# Patient Record
Sex: Female | Born: 1945 | Race: White | Hispanic: No | Marital: Married | Smoking: Former smoker
Health system: Southern US, Community
[De-identification: ages and names within clinical notes are randomized; demographics above are authoritative.]

## PROBLEM LIST (undated history)

## (undated) DIAGNOSIS — M8580 Other specified disorders of bone density and structure, unspecified site: Secondary | ICD-10-CM

## (undated) DIAGNOSIS — E785 Hyperlipidemia, unspecified: Secondary | ICD-10-CM

## (undated) DIAGNOSIS — J449 Chronic obstructive pulmonary disease, unspecified: Secondary | ICD-10-CM

## (undated) DIAGNOSIS — I10 Essential (primary) hypertension: Secondary | ICD-10-CM

## (undated) HISTORY — DX: Other specified disorders of bone density and structure, unspecified site: M85.80

## (undated) HISTORY — DX: Essential (primary) hypertension: I10

## (undated) HISTORY — PX: TUBAL LIGATION: SHX77

## (undated) HISTORY — DX: Chronic obstructive pulmonary disease, unspecified: J44.9

## (undated) HISTORY — DX: Hyperlipidemia, unspecified: E78.5

---

## 2006-06-21 ENCOUNTER — Encounter (INDEPENDENT_AMBULATORY_CARE_PROVIDER_SITE_OTHER): Payer: Self-pay | Admitting: Interventional Radiology

## 2006-06-21 ENCOUNTER — Ambulatory Visit (HOSPITAL_COMMUNITY): Admission: RE | Admit: 2006-06-21 | Discharge: 2006-06-21 | Payer: Self-pay | Admitting: Urology

## 2007-11-11 ENCOUNTER — Encounter: Payer: Self-pay | Admitting: Internal Medicine

## 2008-01-10 ENCOUNTER — Encounter: Payer: Self-pay | Admitting: Internal Medicine

## 2008-03-02 ENCOUNTER — Telehealth: Payer: Self-pay | Admitting: Internal Medicine

## 2008-03-13 ENCOUNTER — Ambulatory Visit: Payer: Self-pay | Admitting: Internal Medicine

## 2008-03-13 DIAGNOSIS — J309 Allergic rhinitis, unspecified: Secondary | ICD-10-CM | POA: Insufficient documentation

## 2008-03-13 DIAGNOSIS — J439 Emphysema, unspecified: Secondary | ICD-10-CM

## 2008-03-13 DIAGNOSIS — J45909 Unspecified asthma, uncomplicated: Secondary | ICD-10-CM | POA: Insufficient documentation

## 2008-03-13 DIAGNOSIS — F410 Panic disorder [episodic paroxysmal anxiety] without agoraphobia: Secondary | ICD-10-CM

## 2008-03-20 ENCOUNTER — Ambulatory Visit: Payer: Self-pay | Admitting: Internal Medicine

## 2008-04-20 ENCOUNTER — Telehealth (INDEPENDENT_AMBULATORY_CARE_PROVIDER_SITE_OTHER): Payer: Self-pay | Admitting: *Deleted

## 2008-04-28 ENCOUNTER — Telehealth (INDEPENDENT_AMBULATORY_CARE_PROVIDER_SITE_OTHER): Payer: Self-pay | Admitting: *Deleted

## 2008-05-28 ENCOUNTER — Telehealth (INDEPENDENT_AMBULATORY_CARE_PROVIDER_SITE_OTHER): Payer: Self-pay | Admitting: *Deleted

## 2008-06-12 ENCOUNTER — Ambulatory Visit: Payer: Self-pay | Admitting: Internal Medicine

## 2008-06-30 ENCOUNTER — Telehealth (INDEPENDENT_AMBULATORY_CARE_PROVIDER_SITE_OTHER): Payer: Self-pay | Admitting: *Deleted

## 2009-02-05 ENCOUNTER — Telehealth (INDEPENDENT_AMBULATORY_CARE_PROVIDER_SITE_OTHER): Payer: Self-pay | Admitting: *Deleted

## 2009-03-04 ENCOUNTER — Telehealth (INDEPENDENT_AMBULATORY_CARE_PROVIDER_SITE_OTHER): Payer: Self-pay | Admitting: *Deleted

## 2010-02-13 ENCOUNTER — Encounter: Payer: Self-pay | Admitting: Urology

## 2010-02-23 NOTE — Progress Notes (Signed)
Summary: [employer] Disability  [employer] Disability form received. Request forwarded to [employer]. Gabriela Wilson  February 05, 2009 4:51 PM

## 2010-02-23 NOTE — Progress Notes (Signed)
Summary: [employer] Disability Form   [employer] Disability Form for completion. Forwarded to [employer]. Dena Chavis  March 04, 2009 3:35 PM

## 2010-10-03 ENCOUNTER — Encounter: Payer: Self-pay | Admitting: Gastroenterology

## 2010-10-14 ENCOUNTER — Ambulatory Visit (AMBULATORY_SURGERY_CENTER): Payer: Medicare Other | Admitting: *Deleted

## 2010-10-14 ENCOUNTER — Encounter: Payer: Self-pay | Admitting: Gastroenterology

## 2010-10-14 VITALS — Ht 63.0 in | Wt 186.0 lb

## 2010-10-14 DIAGNOSIS — Z1211 Encounter for screening for malignant neoplasm of colon: Secondary | ICD-10-CM

## 2010-10-14 MED ORDER — PEG-KCL-NACL-NASULF-NA ASC-C 100 G PO SOLR: ORAL | Status: DC

## 2010-10-28 ENCOUNTER — Ambulatory Visit (AMBULATORY_SURGERY_CENTER): Payer: Medicare Other | Admitting: Gastroenterology

## 2010-10-28 ENCOUNTER — Encounter: Payer: Self-pay | Admitting: Gastroenterology

## 2010-10-28 VITALS — BP 135/71 | HR 64 | Temp 97.9°F | Resp 19 | Ht 63.0 in | Wt 186.0 lb

## 2010-10-28 DIAGNOSIS — Z1211 Encounter for screening for malignant neoplasm of colon: Secondary | ICD-10-CM

## 2010-10-28 DIAGNOSIS — K5730 Diverticulosis of large intestine without perforation or abscess without bleeding: Secondary | ICD-10-CM

## 2010-10-28 MED ORDER — SODIUM CHLORIDE 0.9 % IV SOLN: 500.0000 mL | INTRAVENOUS | Status: DC

## 2010-10-28 NOTE — Patient Instructions (Signed)
Discharge instructions given with verbal understanding. Handouts on diverticulosis and a high fiber diet given. Resume previous medications. 

## 2010-10-31 ENCOUNTER — Telehealth: Payer: Self-pay | Admitting: *Deleted

## 2010-10-31 NOTE — Telephone Encounter (Signed)

## 2012-05-08 ENCOUNTER — Ambulatory Visit (INDEPENDENT_AMBULATORY_CARE_PROVIDER_SITE_OTHER): Payer: Medicare Other | Admitting: Nurse Practitioner

## 2012-05-08 ENCOUNTER — Encounter: Payer: Self-pay | Admitting: Nurse Practitioner

## 2012-05-08 VITALS — BP 112/61 | HR 62 | Temp 97.6°F | Ht 64.0 in | Wt 184.0 lb

## 2012-05-08 DIAGNOSIS — I10 Essential (primary) hypertension: Secondary | ICD-10-CM

## 2012-05-08 DIAGNOSIS — K219 Gastro-esophageal reflux disease without esophagitis: Secondary | ICD-10-CM

## 2012-05-08 DIAGNOSIS — J449 Chronic obstructive pulmonary disease, unspecified: Secondary | ICD-10-CM

## 2012-05-08 DIAGNOSIS — F411 Generalized anxiety disorder: Secondary | ICD-10-CM

## 2012-05-08 LAB — COMPLETE METABOLIC PANEL WITH GFR
Albumin: 4.4 g/dL (ref 3.5–5.2)
Alkaline Phosphatase: 48 U/L (ref 39–117)
BUN: 19 mg/dL (ref 6–23)
CO2: 28 mEq/L (ref 19–32)
GFR, Est African American: 69 mL/min
GFR, Est Non African American: 60 mL/min
Glucose, Bld: 97 mg/dL (ref 70–99)
Total Bilirubin: 0.7 mg/dL (ref 0.3–1.2)

## 2012-05-08 MED ORDER — ALPRAZOLAM 0.5 MG PO TABS: 0.5000 mg | ORAL_TABLET | Freq: Every day | ORAL | Status: DC

## 2012-05-08 MED ORDER — IPRATROPIUM-ALBUTEROL 18-103 MCG/ACT IN AERO: 2.0000 | INHALATION_SPRAY | Freq: Four times a day (QID) | RESPIRATORY_TRACT | Status: DC | PRN

## 2012-05-08 MED ORDER — LISINOPRIL-HYDROCHLOROTHIAZIDE 10-12.5 MG PO TABS: 1.0000 | ORAL_TABLET | Freq: Every day | ORAL | Status: DC

## 2012-05-08 MED ORDER — OMEPRAZOLE 20 MG PO CPDR: 20.0000 mg | DELAYED_RELEASE_CAPSULE | Freq: Every day | ORAL | Status: DC

## 2012-05-08 MED ORDER — FLUTICASONE-SALMETEROL 100-50 MCG/DOSE IN AEPB: 1.0000 | INHALATION_SPRAY | Freq: Two times a day (BID) | RESPIRATORY_TRACT | Status: DC

## 2012-05-08 MED ORDER — ALBUTEROL SULFATE HFA 108 (90 BASE) MCG/ACT IN AERS: 2.0000 | INHALATION_SPRAY | Freq: Four times a day (QID) | RESPIRATORY_TRACT | Status: DC | PRN

## 2012-05-08 NOTE — Patient Instructions (Signed)

## 2012-05-08 NOTE — Progress Notes (Signed)
  Subjective:    Patient ID: Gabriela Wilson, female    DOB: 03-21-1945, 67 y.o.   MRN: [id number]  Hypertension This is a chronic problem. The current episode started more than 1 year ago. The problem has been gradually improving since onset. The problem is controlled. Pertinent negatives include no chest pain, headaches, palpitations, peripheral edema or shortness of breath. Risk factors for coronary artery disease include post-menopausal state. Past treatments include ACE inhibitors and diuretics. The current treatment provides significant improvement. There are no compliance problems.    COPD Chronic problem. Managing well. Using Alburterol 2x/week.  GAD Chronic problem. No recent panic attacks. SOB is only trigger for anxiety. Taking Xanax once a day.   Review of Systems  Respiratory: Negative for shortness of breath.   Cardiovascular: Negative for chest pain and palpitations.  Neurological: Negative for headaches.  All other systems reviewed and are negative.       Objective:   Physical Exam  Constitutional: She is oriented to person, place, and time. She appears well-developed and well-nourished.  HENT:  Nose: Nose normal.  Mouth/Throat: Oropharynx is clear and moist.  Eyes: EOM are normal.  Neck: Trachea normal, normal range of motion and full passive range of motion without pain. Neck supple. No JVD present. Carotid bruit is not present. No thyromegaly present.  Cardiovascular: Normal rate, regular rhythm, normal heart sounds and intact distal pulses.  Exam reveals no gallop and no friction rub.   No murmur heard. Pulmonary/Chest: Effort normal and breath sounds normal.  Abdominal: Soft. Bowel sounds are normal. She exhibits no distension and no mass. There is no tenderness.  Musculoskeletal: Normal range of motion.  Lymphadenopathy:    She has no cervical adenopathy.  Neurological: She is alert and oriented to person, place, and time. She has normal reflexes.  Skin: Skin  is warm and dry.  Psychiatric: She has a normal mood and affect. Her behavior is normal. Judgment and thought content normal.   BP 112/61  Pulse 62  Temp(Src) 97.6 F (36.4 C) (Oral)  Ht 5\' 4"  (1.626 m)  Wt 184 lb (83.462 kg)  BMI 31.57 kg/m2         Assessment & Plan:  1. GAD (generalized anxiety disorder) Stress Management - ALPRAZolam (XANAX) 0.5 MG tablet; Take 1 tablet (0.5 mg total) by mouth daily.  Dispense: 30 tablet; Refill: 0  2. Essential hypertension, benign Low Na+ diet Exercise - lisinopril-hydrochlorothiazide (PRINZIDE,ZESTORETIC) 10-12.5 MG per tablet; Take 1 tablet by mouth daily.  Dispense: 90 tablet; Refill: 1 - COMPLETE METABOLIC PANEL WITH GFR - NMR Lipoprofile with Lipids  3. GERD (gastroesophageal reflux disease) Avoid irritating foods - omeprazole (PRILOSEC) 20 MG capsule; Take 1 capsule (20 mg total) by mouth daily.  Dispense: 90 capsule; Refill: 1  4. COPD (chronic obstructive pulmonary disease) Continue Advair daily - albuterol-ipratropium (COMBIVENT) 18-103 MCG/ACT inhaler; Inhale 2 puffs into the lungs every 6 (six) hours as needed.  Dispense: 1 Inhaler; Refill: 1 - Fluticasone-Salmeterol (ADVAIR DISKUS) 100-50 MCG/DOSE AEPB; Inhale 1 puff into the lungs 2 (two) times daily.  Dispense: 60 each; Refill: 5 - albuterol (PROAIR HFA) 108 (90 BASE) MCG/ACT inhaler; Inhale 2 puffs into the lungs every 6 (six) hours as needed.  Dispense: 1 Inhaler; Refill: 1  Mary-Margaret Daphine Deutscher, FNP

## 2012-05-09 ENCOUNTER — Other Ambulatory Visit: Payer: Self-pay | Admitting: Nurse Practitioner

## 2012-05-09 LAB — NMR LIPOPROFILE WITH LIPIDS
HDL Size: 9 nm — ABNORMAL LOW (ref >=9.2)
HDL-C: 43 mg/dL (ref >=40)
LDL Particle Number: 1400 nmol/L — ABNORMAL HIGH (ref <1000)
LDL Size: 20.5 nm — ABNORMAL LOW (ref >20.5)
Large HDL-P: 3.1 umol/L — ABNORMAL LOW (ref >=4.8)
Large VLDL-P: 0.8 nmol/L (ref <=2.7)
Triglycerides: 94 mg/dL (ref <150)
VLDL Size: 39.9 nm (ref <=46.6)

## 2012-05-09 MED ORDER — SIMVASTATIN 40 MG PO TABS: 40.0000 mg | ORAL_TABLET | Freq: Every day | ORAL | Status: DC

## 2012-08-22 ENCOUNTER — Encounter: Payer: Self-pay | Admitting: General Practice

## 2012-08-22 ENCOUNTER — Ambulatory Visit (INDEPENDENT_AMBULATORY_CARE_PROVIDER_SITE_OTHER): Payer: Medicare Other | Admitting: General Practice

## 2012-08-22 ENCOUNTER — Telehealth: Payer: Self-pay | Admitting: Nurse Practitioner

## 2012-08-22 VITALS — BP 135/67 | HR 76 | Temp 97.7°F | Ht 64.0 in | Wt 165.5 lb

## 2012-08-22 DIAGNOSIS — J322 Chronic ethmoidal sinusitis: Secondary | ICD-10-CM

## 2012-08-22 MED ORDER — CLARITHROMYCIN 500 MG PO TABS: 500.0000 mg | ORAL_TABLET | Freq: Two times a day (BID) | ORAL | Status: DC

## 2012-08-22 NOTE — Patient Instructions (Addendum)

## 2012-08-22 NOTE — Progress Notes (Signed)
  Subjective:    Patient ID: [name], female    DOB: 1946-01-19, 67 y.o.   MRN: [id number]  Sinusitis This is a new problem. The current episode started 1 to 4 weeks ago. The problem is unchanged. There has been no fever. Her pain is at a severity of 4/10. Associated symptoms include ear pain, headaches and sinus pressure. Pertinent negatives include no chills, congestion, coughing, neck pain or shortness of breath. Past treatments include spray decongestants, acetaminophen and oral decongestants.      Review of Systems  Constitutional: Negative for fever and chills.  HENT: Positive for ear pain and sinus pressure. Negative for congestion, neck pain and neck stiffness.   Respiratory: Negative for cough, chest tightness and shortness of breath.   Cardiovascular: Negative for chest pain and palpitations.  Genitourinary: Negative for difficulty urinating.  Neurological: Positive for headaches. Negative for dizziness and weakness.       Objective:   Physical Exam  Constitutional: She is oriented to person, place, and time. She appears well-developed and well-nourished.  HENT:  Head: Normocephalic and atraumatic.  Right Ear: External ear normal.  Left Ear: External ear normal.  Nose: Right sinus exhibits maxillary sinus tenderness and frontal sinus tenderness. Left sinus exhibits maxillary sinus tenderness and frontal sinus tenderness.  Mouth/Throat: Oropharynx is clear and moist.  Eyes: Conjunctivae and EOM are normal. Pupils are equal, round, and reactive to light.  Neck: Normal range of motion. Neck supple. No thyromegaly present.  Cardiovascular: Normal rate, regular rhythm and normal heart sounds.   Pulmonary/Chest: Effort normal and breath sounds normal. No respiratory distress. She exhibits no tenderness.  Lymphadenopathy:    She has no cervical adenopathy.  Neurological: She is alert and oriented to person, place, and time.  Skin: Skin is warm and dry.  Psychiatric: She  has a normal mood and affect.          Assessment & Plan:  1. Ethmoid sinusitis - clarithromycin (BIAXIN) 500 MG tablet; Take 1 tablet (500 mg total) by mouth 2 (two) times daily.  Dispense: 20 tablet; Refill: 0 -increase fluid intake -tyenol or motrin for mild discomfort as directed -RTO if symptom worsen or unresolved -new toothbrush 3 days after starting antibiotics -Patient verbalized understanding -Coralie Keens, FNP-C

## 2012-08-22 NOTE — Telephone Encounter (Signed)
Appt given for today 

## 2012-08-28 ENCOUNTER — Other Ambulatory Visit: Payer: Self-pay

## 2012-09-11 ENCOUNTER — Ambulatory Visit (INDEPENDENT_AMBULATORY_CARE_PROVIDER_SITE_OTHER): Payer: Medicare Other | Admitting: Nurse Practitioner

## 2012-09-11 ENCOUNTER — Encounter: Payer: Self-pay | Admitting: Nurse Practitioner

## 2012-09-11 VITALS — BP 127/64 | HR 70 | Temp 98.2°F | Ht 64.0 in | Wt 162.5 lb

## 2012-09-11 DIAGNOSIS — F411 Generalized anxiety disorder: Secondary | ICD-10-CM

## 2012-09-11 DIAGNOSIS — J449 Chronic obstructive pulmonary disease, unspecified: Secondary | ICD-10-CM

## 2012-09-11 DIAGNOSIS — I10 Essential (primary) hypertension: Secondary | ICD-10-CM

## 2012-09-11 DIAGNOSIS — E785 Hyperlipidemia, unspecified: Secondary | ICD-10-CM | POA: Insufficient documentation

## 2012-09-11 DIAGNOSIS — K219 Gastro-esophageal reflux disease without esophagitis: Secondary | ICD-10-CM

## 2012-09-11 MED ORDER — LISINOPRIL-HYDROCHLOROTHIAZIDE 10-12.5 MG PO TABS: 1.0000 | ORAL_TABLET | Freq: Every day | ORAL | Status: DC

## 2012-09-11 MED ORDER — FLUTICASONE-SALMETEROL 100-50 MCG/DOSE IN AEPB: 1.0000 | INHALATION_SPRAY | Freq: Two times a day (BID) | RESPIRATORY_TRACT | Status: DC

## 2012-09-11 MED ORDER — IPRATROPIUM-ALBUTEROL 18-103 MCG/ACT IN AERO: 2.0000 | INHALATION_SPRAY | Freq: Four times a day (QID) | RESPIRATORY_TRACT | Status: DC | PRN

## 2012-09-11 MED ORDER — ALPRAZOLAM 0.5 MG PO TABS: 0.5000 mg | ORAL_TABLET | Freq: Every day | ORAL | Status: DC

## 2012-09-11 MED ORDER — ALBUTEROL SULFATE HFA 108 (90 BASE) MCG/ACT IN AERS: 2.0000 | INHALATION_SPRAY | Freq: Four times a day (QID) | RESPIRATORY_TRACT | Status: DC | PRN

## 2012-09-11 MED ORDER — OMEPRAZOLE 20 MG PO CPDR: 20.0000 mg | DELAYED_RELEASE_CAPSULE | Freq: Every day | ORAL | Status: DC

## 2012-09-11 NOTE — Progress Notes (Signed)
Subjective:    Patient ID: Gabriela Wilson, female    DOB: 1945-07-28, 67 y.o.   MRN: [id number]  Hypertension This is a chronic problem. The current episode started more than 1 year ago. The problem has been gradually improving since onset. The problem is controlled. Pertinent negatives include no chest pain, headaches, palpitations, peripheral edema or shortness of breath. Risk factors for coronary artery disease include post-menopausal state. Past treatments include ACE inhibitors and diuretics. The current treatment provides significant improvement. There are no compliance problems.   Hyperlipidemia This is a chronic problem. The current episode started more than 1 year ago. The problem is uncontrolled. Recent lipid tests were reviewed and are high. She has no history of diabetes or hypothyroidism. Pertinent negatives include no chest pain or shortness of breath. Treatments tried: was started on statin but patient stopped on her own and is trying just fish oil. Compliance problems include adherence to diet and adherence to exercise.  Risk factors for coronary artery disease include hypertension, obesity and post-menopausal.   COPD Chronic problem. Managing well. Using Alburterol 2x/week.  GAD Chronic problem. No recent panic attacks. SOB is only trigger for anxiety. Taking Xanax once a day. GERD Prilosec OTC- keeps symptoms under control   Review of Systems  Respiratory: Negative for shortness of breath.   Cardiovascular: Negative for chest pain and palpitations.  Neurological: Negative for headaches.  All other systems reviewed and are negative.       Objective:   Physical Exam  Constitutional: She is oriented to person, place, and time. She appears well-developed and well-nourished.  HENT:  Nose: Nose normal.  Mouth/Throat: Oropharynx is clear and moist.  Eyes: EOM are normal.  Neck: Trachea normal, normal range of motion and full passive range of motion without pain. Neck  supple. No JVD present. Carotid bruit is not present. No thyromegaly present.  Cardiovascular: Normal rate, regular rhythm, normal heart sounds and intact distal pulses.  Exam reveals no gallop and no friction rub.   No murmur heard. Pulmonary/Chest: Effort normal and breath sounds normal.  Abdominal: Soft. Bowel sounds are normal. She exhibits no distension and no mass. There is no tenderness.  Musculoskeletal: Normal range of motion.  Lymphadenopathy:    She has no cervical adenopathy.  Neurological: She is alert and oriented to person, place, and time. She has normal reflexes.  Skin: Skin is warm and dry.  Psychiatric: She has a normal mood and affect. Her behavior is normal. Judgment and thought content normal.   BP 127/64  Pulse 70  Temp(Src) 98.2 F (36.8 C) (Oral)  Ht 5\' 4"  (1.626 m)  Wt 162 lb 8 oz (73.71 kg)  BMI 27.88 kg/m2         Assessment & Plan:  1. GERD (gastroesophageal reflux disease) Avoid spicy an dfatty foods - omeprazole (PRILOSEC) 20 MG capsule; Take 1 capsule (20 mg total) by mouth daily.  Dispense: 90 capsule; Refill: 1  2. Essential hypertension, benign Low NA+ diat - lisinopril-hydrochlorothiazide (PRINZIDE,ZESTORETIC) 10-12.5 MG per tablet; Take 1 tablet by mouth daily.  Dispense: 90 tablet; Refill: 1 - CMP14+EGFR  3. COPD (chronic obstructive pulmonary disease) Avoid allergens - Fluticasone-Salmeterol (ADVAIR DISKUS) 100-50 MCG/DOSE AEPB; Inhale 1 puff into the lungs 2 (two) times daily.  Dispense: 60 each; Refill: 5 - albuterol-ipratropium (COMBIVENT) 18-103 MCG/ACT inhaler; Inhale 2 puffs into the lungs every 6 (six) hours as needed.  Dispense: 1 Inhaler; Refill: 5 - albuterol (PROAIR HFA) 108 (90 BASE) MCG/ACT inhaler; Inhale  2 puffs into the lungs every 6 (six) hours as needed.  Dispense: 1 Inhaler; Refill: 5  4. GAD (generalized anxiety disorder) Stress amnagement - ALPRAZolam (XANAX) 0.5 MG tablet; Take 1 tablet (0.5 mg total) by mouth  daily.  Dispense: 30 tablet; Refill: 1  5. Hyperlipidemia LDL goal < 100 Low fat diet and exerxcise encouraged Will talk about statin once labs are back - NMR, lipoprofile  Health maintenance reviewed Hemocult cards given  Mary-Margaret Daphine Deutscher, FNP

## 2012-09-11 NOTE — Patient Instructions (Signed)

## 2012-09-12 LAB — NMR, LIPOPROFILE
Cholesterol: 191 mg/dL (ref <200)
LDL Particle Number: 1826 nmol/L — ABNORMAL HIGH (ref <1000)
LDL Size: 20.5 nm — ABNORMAL LOW (ref >20.5)
LDLC SERPL CALC-MCNC: 125 mg/dL — ABNORMAL HIGH (ref <100)
LP-IR Score: 43 (ref <=45)

## 2012-09-12 LAB — CMP14+EGFR
ALT: 22 IU/L (ref 0–32)
AST: 19 IU/L (ref 0–40)
Albumin: 4.5 g/dL (ref 3.6–4.8)
Alkaline Phosphatase: 54 IU/L (ref 39–117)
BUN/Creatinine Ratio: 16 (ref 11–26)
BUN: 16 mg/dL (ref 8–27)
CO2: 25 mmol/L (ref 18–29)
Chloride: 99 mmol/L (ref 97–108)
GFR calc Af Amer: 68 mL/min/{1.73_m2} (ref >59)
Potassium: 4.4 mmol/L (ref 3.5–5.2)
Sodium: 138 mmol/L (ref 134–144)
Total Bilirubin: 0.6 mg/dL (ref 0.0–1.2)

## 2012-09-13 ENCOUNTER — Other Ambulatory Visit: Payer: Self-pay | Admitting: Nurse Practitioner

## 2012-09-13 DIAGNOSIS — E785 Hyperlipidemia, unspecified: Secondary | ICD-10-CM

## 2012-09-13 MED ORDER — ATORVASTATIN CALCIUM 40 MG PO TABS: 40.0000 mg | ORAL_TABLET | Freq: Every day | ORAL | Status: DC

## 2012-11-28 ENCOUNTER — Other Ambulatory Visit: Payer: Self-pay

## 2012-12-13 ENCOUNTER — Encounter: Payer: Self-pay | Admitting: Nurse Practitioner

## 2012-12-13 ENCOUNTER — Ambulatory Visit (INDEPENDENT_AMBULATORY_CARE_PROVIDER_SITE_OTHER): Payer: Medicare Other | Admitting: Nurse Practitioner

## 2012-12-13 VITALS — BP 131/77 | HR 64 | Temp 97.7°F | Ht 64.0 in | Wt 159.0 lb

## 2012-12-13 DIAGNOSIS — E785 Hyperlipidemia, unspecified: Secondary | ICD-10-CM

## 2012-12-13 DIAGNOSIS — F411 Generalized anxiety disorder: Secondary | ICD-10-CM

## 2012-12-13 DIAGNOSIS — I10 Essential (primary) hypertension: Secondary | ICD-10-CM

## 2012-12-13 DIAGNOSIS — Z23 Encounter for immunization: Secondary | ICD-10-CM

## 2012-12-13 DIAGNOSIS — J449 Chronic obstructive pulmonary disease, unspecified: Secondary | ICD-10-CM

## 2012-12-13 DIAGNOSIS — K219 Gastro-esophageal reflux disease without esophagitis: Secondary | ICD-10-CM

## 2012-12-13 MED ORDER — LISINOPRIL-HYDROCHLOROTHIAZIDE 10-12.5 MG PO TABS: 1.0000 | ORAL_TABLET | Freq: Every day | ORAL | Status: DC

## 2012-12-13 MED ORDER — ALPRAZOLAM 0.5 MG PO TABS: 0.5000 mg | ORAL_TABLET | Freq: Every day | ORAL | Status: DC

## 2012-12-13 MED ORDER — ATORVASTATIN CALCIUM 40 MG PO TABS: 40.0000 mg | ORAL_TABLET | Freq: Every day | ORAL | Status: DC

## 2012-12-13 MED ORDER — OMEPRAZOLE 20 MG PO CPDR: 20.0000 mg | DELAYED_RELEASE_CAPSULE | Freq: Every day | ORAL | Status: DC

## 2012-12-13 NOTE — Patient Instructions (Signed)

## 2012-12-13 NOTE — Progress Notes (Signed)
Subjective:    Patient ID: Gabriela Wilson, female    DOB: Jan 19, 1946, 67 y.o.   MRN: [id number]  Hypertension This is a chronic problem. The current episode started more than 1 year ago. The problem has been gradually improving since onset. The problem is controlled. Pertinent negatives include no chest pain, headaches, palpitations, peripheral edema or shortness of breath. Risk factors for coronary artery disease include post-menopausal state. Past treatments include ACE inhibitors and diuretics. The current treatment provides significant improvement. There are no compliance problems.   Hyperlipidemia This is a chronic problem. The current episode started more than 1 year ago. The problem is uncontrolled. Recent lipid tests were reviewed and are high. She has no history of diabetes or hypothyroidism. Pertinent negatives include no chest pain or shortness of breath. Treatments tried: was started on statin but patient stopped on her own and is trying just fish oil. Compliance problems include adherence to diet and adherence to exercise.  Risk factors for coronary artery disease include hypertension, obesity and post-menopausal.   COPD Chronic problem. Managing well. Using Alburterol 2x/week.  GAD Chronic problem. No recent panic attacks. SOB is only trigger for anxiety. Taking Xanax once a day. GERD Prilosec OTC- keeps symptoms under control   Review of Systems  Respiratory: Negative for shortness of breath.   Cardiovascular: Negative for chest pain and palpitations.  Neurological: Negative for headaches.  All other systems reviewed and are negative.       Objective:   Physical Exam  Constitutional: She is oriented to person, place, and time. She appears well-developed and well-nourished.  HENT:  Nose: Nose normal.  Mouth/Throat: Oropharynx is clear and moist.  Eyes: EOM are normal.  Neck: Trachea normal, normal range of motion and full passive range of motion without pain. Neck  supple. No JVD present. Carotid bruit is not present. No thyromegaly present.  Cardiovascular: Normal rate, regular rhythm, normal heart sounds and intact distal pulses.  Exam reveals no gallop and no friction rub.   No murmur heard. Pulmonary/Chest: Effort normal and breath sounds normal.  Abdominal: Soft. Bowel sounds are normal. She exhibits no distension and no mass. There is no tenderness.  Musculoskeletal: Normal range of motion.  Lymphadenopathy:    She has no cervical adenopathy.  Neurological: She is alert and oriented to person, place, and time. She has normal reflexes.  Skin: Skin is warm and dry.  Psychiatric: She has a normal mood and affect. Her behavior is normal. Judgment and thought content normal.   BP 131/77  Pulse 64  Temp(Src) 97.7 F (36.5 C) (Oral)  Ht 5\' 4"  (1.626 m)  Wt 159 lb (72.122 kg)  BMI 27.28 kg/m2         Assessment & Plan:   1. Hyperlipidemia LDL goal < 100   2. GAD (generalized anxiety disorder)   3. Essential hypertension, benign   4. COPD   5. GERD (gastroesophageal reflux disease)    Orders Placed This Encounter  Procedures  . CMP14+EGFR  . NMR, lipoprofile   Meds ordered this encounter  Medications  . DISCONTD: atorvastatin (LIPITOR) 40 MG tablet    Sig: Take 1 tablet (40 mg total) by mouth daily.    Dispense:  30 tablet    Refill:  1    Order Specific Question:  Supervising Provider    Answer:  Ernestina Penna [[id number]]  . DISCONTD: omeprazole (PRILOSEC) 20 MG capsule    Sig: Take 1 capsule (20 mg total) by mouth daily.  Dispense:  30 capsule    Refill:  1    Order Specific Question:  Supervising Provider    Answer:  Ernestina Penna [[id number]]  . DISCONTD: lisinopril-hydrochlorothiazide (PRINZIDE,ZESTORETIC) 10-12.5 MG per tablet    Sig: Take 1 tablet by mouth daily.    Dispense:  30 tablet    Refill:  1    Order Specific Question:  Supervising Provider    Answer:  Ernestina Penna [[id number]]  . ALPRAZolam (XANAX) 0.5 MG tablet     Sig: Take 1 tablet (0.5 mg total) by mouth daily.    Dispense:  30 tablet    Refill:  1    Order Specific Question:  Supervising Provider    Answer:  Ernestina Penna [[id number]]  . atorvastatin (LIPITOR) 40 MG tablet    Sig: Take 1 tablet (40 mg total) by mouth daily.    Dispense:  90 tablet    Refill:  1    Order Specific Question:  Supervising Provider    Answer:  Ernestina Penna [[id number]]  . omeprazole (PRILOSEC) 20 MG capsule    Sig: Take 1 capsule (20 mg total) by mouth daily.    Dispense:  90 capsule    Refill:  1    Order Specific Question:  Supervising Provider    Answer:  Ernestina Penna [[id number]]  . lisinopril-hydrochlorothiazide (PRINZIDE,ZESTORETIC) 10-12.5 MG per tablet    Sig: Take 1 tablet by mouth daily.    Dispense:  90 tablet    Refill:  1    Order Specific Question:  Supervising Provider    Answer:  Deborra Medina    Continue all meds Labs pending Diet and exercise encouraged Health maintenance reviewed Follow up in 3 months Patient given hemocult cards  Mary-Margaret Daphine Deutscher, FNP

## 2012-12-14 LAB — CMP14+EGFR
ALT: 21 IU/L (ref 0–32)
AST: 16 IU/L (ref 0–40)
Albumin/Globulin Ratio: 2.3 (ref 1.1–2.5)
Alkaline Phosphatase: 53 IU/L (ref 39–117)
BUN/Creatinine Ratio: 23 (ref 11–26)
CO2: 27 mmol/L (ref 18–29)
Calcium: 9.8 mg/dL (ref 8.6–10.2)
Chloride: 99 mmol/L (ref 97–108)
GFR calc Af Amer: 88 mL/min/{1.73_m2} (ref >59)
GFR calc non Af Amer: 76 mL/min/{1.73_m2} (ref >59)
Potassium: 4.7 mmol/L (ref 3.5–5.2)
Sodium: 140 mmol/L (ref 134–144)
Total Bilirubin: 0.7 mg/dL (ref 0.0–1.2)
Total Protein: 6.3 g/dL (ref 6.0–8.5)

## 2012-12-14 LAB — NMR, LIPOPROFILE
HDL Particle Number: 33.8 umol/L (ref >=30.5)
LDL Particle Number: 1374 nmol/L — ABNORMAL HIGH (ref <1000)
LDL Size: 20.3 nm — ABNORMAL LOW (ref >20.5)
LP-IR Score: 55 — ABNORMAL HIGH (ref <=45)
Small LDL Particle Number: 859 nmol/L — ABNORMAL HIGH (ref <=527)
Triglycerides by NMR: 96 mg/dL (ref <150)

## 2013-03-26 ENCOUNTER — Ambulatory Visit (INDEPENDENT_AMBULATORY_CARE_PROVIDER_SITE_OTHER): Payer: Commercial Managed Care - HMO | Admitting: Nurse Practitioner

## 2013-03-26 ENCOUNTER — Encounter: Payer: Self-pay | Admitting: Nurse Practitioner

## 2013-03-26 VITALS — BP 124/70 | HR 69 | Temp 98.0°F | Ht 64.0 in | Wt 162.0 lb

## 2013-03-26 DIAGNOSIS — F410 Panic disorder [episodic paroxysmal anxiety] without agoraphobia: Secondary | ICD-10-CM

## 2013-03-26 DIAGNOSIS — K219 Gastro-esophageal reflux disease without esophagitis: Secondary | ICD-10-CM

## 2013-03-26 DIAGNOSIS — E785 Hyperlipidemia, unspecified: Secondary | ICD-10-CM

## 2013-03-26 DIAGNOSIS — I10 Essential (primary) hypertension: Secondary | ICD-10-CM

## 2013-03-26 DIAGNOSIS — J4489 Other specified chronic obstructive pulmonary disease: Secondary | ICD-10-CM

## 2013-03-26 DIAGNOSIS — F411 Generalized anxiety disorder: Secondary | ICD-10-CM

## 2013-03-26 DIAGNOSIS — J449 Chronic obstructive pulmonary disease, unspecified: Secondary | ICD-10-CM

## 2013-03-26 MED ORDER — FLUTICASONE-SALMETEROL 100-50 MCG/DOSE IN AEPB
1.0000 | INHALATION_SPRAY | Freq: Two times a day (BID) | RESPIRATORY_TRACT | Status: DC
Start: 2013-03-26 — End: 2013-10-08

## 2013-03-26 MED ORDER — OMEPRAZOLE 20 MG PO CPDR: 20.0000 mg | DELAYED_RELEASE_CAPSULE | Freq: Every day | ORAL | Status: DC

## 2013-03-26 MED ORDER — LISINOPRIL-HYDROCHLOROTHIAZIDE 10-12.5 MG PO TABS: 1.0000 | ORAL_TABLET | Freq: Every day | ORAL | Status: DC

## 2013-03-26 MED ORDER — ALPRAZOLAM 0.5 MG PO TABS: 0.5000 mg | ORAL_TABLET | Freq: Every day | ORAL | Status: DC

## 2013-03-26 MED ORDER — ATORVASTATIN CALCIUM 40 MG PO TABS: 40.0000 mg | ORAL_TABLET | Freq: Every day | ORAL | Status: DC

## 2013-03-26 MED ORDER — ALBUTEROL SULFATE HFA 108 (90 BASE) MCG/ACT IN AERS: 2.0000 | INHALATION_SPRAY | Freq: Four times a day (QID) | RESPIRATORY_TRACT | Status: DC | PRN

## 2013-03-26 NOTE — Progress Notes (Signed)
Subjective:    Patient ID: Gabriela Wilson, female    DOB: Aug 03, 1945, 68 y.o.   MRN: [id number]  Patien tin today for follow up of chronic medical problems- no complaints today  Hypertension This is a chronic problem. The current episode started more than 1 year ago. The problem has been gradually improving since onset. The problem is controlled. Pertinent negatives include no chest pain, headaches, palpitations, peripheral edema or shortness of breath. Risk factors for coronary artery disease include post-menopausal state. Past treatments include ACE inhibitors and diuretics. The current treatment provides significant improvement. There are no compliance problems.   Hyperlipidemia This is a chronic problem. The current episode started more than 1 year ago. The problem is uncontrolled. Recent lipid tests were reviewed and are high. She has no history of diabetes or hypothyroidism. Pertinent negatives include no chest pain or shortness of breath. Treatments tried: was started on statin but patient stopped on her own and is trying just fish oil. Compliance problems include adherence to diet and adherence to exercise.  Risk factors for coronary artery disease include hypertension, obesity and post-menopausal.  COPD Chronic problem. Managing well. Using Alburterol 2x/week. GAD Chronic problem. No recent panic attacks. SOB is only trigger for anxiety. Taking Xanax once a day. GERD Prilosec OTC- keeps symptoms under control   Review of Systems  Respiratory: Negative for shortness of breath.   Cardiovascular: Negative for chest pain and palpitations.  Neurological: Negative for headaches.  All other systems reviewed and are negative.       Objective:   Physical Exam  Constitutional: She is oriented to person, place, and time. She appears well-developed and well-nourished.  HENT:  Nose: Nose normal.  Mouth/Throat: Oropharynx is clear and moist.  Eyes: EOM are normal.  Neck: Trachea normal,  normal range of motion and full passive range of motion without pain. Neck supple. No JVD present. Carotid bruit is not present. No thyromegaly present.  Cardiovascular: Normal rate, regular rhythm, normal heart sounds and intact distal pulses.  Exam reveals no gallop and no friction rub.   No murmur heard. Pulmonary/Chest: Effort normal and breath sounds normal.  Abdominal: Soft. Bowel sounds are normal. She exhibits no distension and no mass. There is no tenderness.  Musculoskeletal: Normal range of motion.  Lymphadenopathy:    She has no cervical adenopathy.  Neurological: She is alert and oriented to person, place, and time. She has normal reflexes.  Skin: Skin is warm and dry.  Psychiatric: She has a normal mood and affect. Her behavior is normal. Judgment and thought content normal.   BP 124/70  Pulse 69  Temp(Src) 98 F (36.7 C) (Oral)  Ht _0  (1.626 m)  Wt 162 lb (73.483 kg)  BMI 27.79 kg/m2         Assessment & Plan:   1. Hyperlipidemia LDL goal < 100   2. GAD (generalized anxiety disorder)   3. Essential hypertension, benign   4. COPD   5. PANIC DISORDER   6. COPD (chronic obstructive pulmonary disease)   7. GERD (gastroesophageal reflux disease)    Orders Placed This Encounter  Procedures  . CMP14+EGFR  . NMR, lipoprofile   Meds ordered this encounter  Medications  . atorvastatin (LIPITOR) 40 MG tablet    Sig: Take 1 tablet (40 mg total) by mouth daily.    Dispense:  90 tablet    Refill:  1    Order Specific Question:  Supervising Provider    Answer:  Laurance Flatten,  DONALD W [[id number]]  . Fluticasone-Salmeterol (ADVAIR DISKUS) 100-50 MCG/DOSE AEPB    Sig: Inhale 1 puff into the lungs 2 (two) times daily.    Dispense:  180 each    Refill:  1    Order Specific Question:  Supervising Provider    Answer:  Chipper Herb [[id number]]  . lisinopril-hydrochlorothiazide (PRINZIDE,ZESTORETIC) 10-12.5 MG per tablet    Sig: Take 1 tablet by mouth daily.    Dispense:  90  tablet    Refill:  1    Order Specific Question:  Supervising Provider    Answer:  Chipper Herb [[id number]]  . omeprazole (PRILOSEC) 20 MG capsule    Sig: Take 1 capsule (20 mg total) by mouth daily.    Dispense:  90 capsule    Refill:  1    Order Specific Question:  Supervising Provider    Answer:  Chipper Herb [[id number]]  . albuterol (PROAIR HFA) 108 (90 BASE) MCG/ACT inhaler    Sig: Inhale 2 puffs into the lungs every 6 (six) hours as needed.    Dispense:  3 Inhaler    Refill:  0    Order Specific Question:  Supervising Provider    Answer:  Chipper Herb [[id number]]  . ALPRAZolam (XANAX) 0.5 MG tablet    Sig: Take 1 tablet (0.5 mg total) by mouth daily.    Dispense:  30 tablet    Refill:  1    Order Specific Question:  Supervising Provider    Answer:  Joycelyn Man   Refuses immunizations Labs pending Health maintenance reviewed Diet and exercise encouraged Continue all meds Follow up  In 3 months   [place], FNP

## 2013-03-26 NOTE — Patient Instructions (Signed)

## 2013-03-28 LAB — CMP14+EGFR
A/G RATIO: 2.1 (ref 1.1–2.5)
ALT: 13 IU/L (ref 0–32)
AST: 17 IU/L (ref 0–40)
Albumin: 4.5 g/dL (ref 3.6–4.8)
Alkaline Phosphatase: 52 IU/L (ref 39–117)
BUN/Creatinine Ratio: 22 (ref 11–26)
BUN: 20 mg/dL (ref 8–27)
CO2: 24 mmol/L (ref 18–29)
Calcium: 10.4 mg/dL — ABNORMAL HIGH (ref 8.7–10.3)
Chloride: 98 mmol/L (ref 97–108)
Creatinine, Ser: 0.89 mg/dL (ref 0.57–1.00)
GFR, EST AFRICAN AMERICAN: 78 mL/min/{1.73_m2} (ref >59)
GFR, EST NON AFRICAN AMERICAN: 68 mL/min/{1.73_m2} (ref >59)
GLOBULIN, TOTAL: 2.1 g/dL (ref 1.5–4.5)
GLUCOSE: 103 mg/dL — AB (ref 65–99)
Potassium: 4.8 mmol/L (ref 3.5–5.2)
Sodium: 143 mmol/L (ref 134–144)
TOTAL PROTEIN: 6.6 g/dL (ref 6.0–8.5)
Total Bilirubin: 0.8 mg/dL (ref 0.0–1.2)

## 2013-03-28 LAB — NMR, LIPOPROFILE
Cholesterol: 142 mg/dL (ref <200)
HDL Cholesterol by NMR: 54 mg/dL (ref >=40)
HDL PARTICLE NUMBER: 37 umol/L (ref >=30.5)
LDL Particle Number: 1086 nmol/L — ABNORMAL HIGH (ref <1000)
LDL Size: 20.4 nm — ABNORMAL LOW (ref >20.5)
LDLC SERPL CALC-MCNC: 66 mg/dL (ref <100)
LP-IR Score: 45 (ref <=45)
SMALL LDL PARTICLE NUMBER: 702 nmol/L — AB (ref <=527)
TRIGLYCERIDES BY NMR: 109 mg/dL (ref <150)

## 2013-07-07 ENCOUNTER — Encounter: Payer: Self-pay | Admitting: Nurse Practitioner

## 2013-07-07 ENCOUNTER — Ambulatory Visit (INDEPENDENT_AMBULATORY_CARE_PROVIDER_SITE_OTHER): Payer: Commercial Managed Care - HMO | Admitting: Nurse Practitioner

## 2013-07-07 VITALS — BP 133/70 | HR 64 | Temp 98.2°F | Ht 64.0 in | Wt 167.6 lb

## 2013-07-07 DIAGNOSIS — I10 Essential (primary) hypertension: Secondary | ICD-10-CM

## 2013-07-07 DIAGNOSIS — F410 Panic disorder [episodic paroxysmal anxiety] without agoraphobia: Secondary | ICD-10-CM

## 2013-07-07 DIAGNOSIS — E785 Hyperlipidemia, unspecified: Secondary | ICD-10-CM

## 2013-07-07 DIAGNOSIS — J449 Chronic obstructive pulmonary disease, unspecified: Secondary | ICD-10-CM

## 2013-07-07 DIAGNOSIS — K219 Gastro-esophageal reflux disease without esophagitis: Secondary | ICD-10-CM

## 2013-07-07 DIAGNOSIS — J4489 Other specified chronic obstructive pulmonary disease: Secondary | ICD-10-CM

## 2013-07-07 DIAGNOSIS — F411 Generalized anxiety disorder: Secondary | ICD-10-CM

## 2013-07-07 MED ORDER — OMEPRAZOLE 20 MG PO CPDR: 20.0000 mg | DELAYED_RELEASE_CAPSULE | Freq: Every day | ORAL | Status: DC

## 2013-07-07 MED ORDER — ALPRAZOLAM 0.5 MG PO TABS: 0.5000 mg | ORAL_TABLET | Freq: Every day | ORAL | Status: DC

## 2013-07-07 MED ORDER — LISINOPRIL-HYDROCHLOROTHIAZIDE 10-12.5 MG PO TABS: 1.0000 | ORAL_TABLET | Freq: Every day | ORAL | Status: DC

## 2013-07-07 NOTE — Patient Instructions (Signed)

## 2013-07-07 NOTE — Progress Notes (Signed)
Subjective:    Patient ID: Gabriela Wilson, female    DOB: January 28, 1945, 68 y.o.   MRN: [id number]  Patien tin today for follow up of chronic medical problems- no complaints today  Hyperlipidemia This is a chronic problem. The current episode started more than 1 year ago. The problem is uncontrolled. Recent lipid tests were reviewed and are high. She has no history of diabetes or hypothyroidism. Pertinent negatives include no shortness of breath. Treatments tried: was started on statin but patient stopped on her own and is trying just fish oil. Compliance problems include adherence to diet and adherence to exercise.  Risk factors for coronary artery disease include hypertension, obesity and post-menopausal.  Hypertension This is a chronic problem. The current episode started more than 1 year ago. The problem has been gradually improving since onset. The problem is controlled. Pertinent negatives include no headaches, palpitations, peripheral edema or shortness of breath. Risk factors for coronary artery disease include post-menopausal state. Past treatments include ACE inhibitors and diuretics. The current treatment provides significant improvement. There are no compliance problems.   COPD Chronic problem. Managing well. Using Alburterol 2x/week. GAD Chronic problem. No recent panic attacks. SOB is only trigger for anxiety. Taking Xanax once a day. GERD Prilosec OTC- keeps symptoms under control   Review of Systems  Respiratory: Negative for shortness of breath.   Cardiovascular: Negative for palpitations.  Neurological: Negative for headaches.  All other systems reviewed and are negative.      Objective:   Physical Exam  Constitutional: She is oriented to person, place, and time. She appears well-developed and well-nourished.  HENT:  Nose: Nose normal.  Mouth/Throat: Oropharynx is clear and moist.  Eyes: EOM are normal.  Neck: Trachea normal, normal range of motion and full passive  range of motion without pain. Neck supple. No JVD present. Carotid bruit is not present. No thyromegaly present.  Cardiovascular: Normal rate, regular rhythm, normal heart sounds and intact distal pulses.  Exam reveals no gallop and no friction rub.   No murmur heard. Pulmonary/Chest: Effort normal and breath sounds normal.  Abdominal: Soft. Bowel sounds are normal. She exhibits no distension and no mass. There is no tenderness.  Musculoskeletal: Normal range of motion.  Lymphadenopathy:    She has no cervical adenopathy.  Neurological: She is alert and oriented to person, place, and time. She has normal reflexes.  Skin: Skin is warm and dry.  Psychiatric: She has a normal mood and affect. Her behavior is normal. Judgment and thought content normal.   BP 133/70  Pulse 64  Temp(Src) 98.2 F (36.8 C) (Oral)  Ht 5' 4"  (1.626 m)  Wt 167 lb 9.6 oz (76.023 kg)  BMI 28.75 kg/m2         Assessment & Plan:   1. PANIC DISORDER   2. Hyperlipidemia LDL goal < 100   3. GAD (generalized anxiety disorder)   4. Essential hypertension, benign   5. COPD   6. GERD (gastroesophageal reflux disease)    Orders Placed This Encounter  Procedures  . CMP14+EGFR  . NMR, lipoprofile   Meds ordered this encounter  Medications  . lisinopril-hydrochlorothiazide (PRINZIDE,ZESTORETIC) 10-12.5 MG per tablet    Sig: Take 1 tablet by mouth daily.    Dispense:  90 tablet    Refill:  1    Order Specific Question:  Supervising Provider    Answer:  Chipper Herb [[id number]]  . ALPRAZolam (XANAX) 0.5 MG tablet    Sig: Take 1 tablet (  0.5 mg total) by mouth daily.    Dispense:  30 tablet    Refill:  1    Order Specific Question:  Supervising Provider    Answer:  Chipper Herb [[id number]]  . omeprazole (PRILOSEC) 20 MG capsule    Sig: Take 1 capsule (20 mg total) by mouth daily.    Dispense:  90 capsule    Refill:  1    Order Specific Question:  Supervising Provider    Answer:  Chipper Herb [place] pending Health maintenance reviewed Diet and exercise encouraged Continue all meds Follow up  In 3 months   [place], FNP

## 2013-07-08 LAB — CMP14+EGFR
ALT: 13 IU/L (ref 0–32)
AST: 15 IU/L (ref 0–40)
Albumin/Globulin Ratio: 2.3 (ref 1.1–2.5)
Albumin: 4.5 g/dL (ref 3.6–4.8)
Alkaline Phosphatase: 58 IU/L (ref 39–117)
BUN/Creatinine Ratio: 23 (ref 11–26)
BUN: 21 mg/dL (ref 8–27)
CALCIUM: 9.6 mg/dL (ref 8.7–10.3)
CO2: 26 mmol/L (ref 18–29)
Chloride: 100 mmol/L (ref 97–108)
Creatinine, Ser: 0.9 mg/dL (ref 0.57–1.00)
GFR calc Af Amer: 77 mL/min/{1.73_m2} (ref >59)
GFR calc non Af Amer: 66 mL/min/{1.73_m2} (ref >59)
Globulin, Total: 2 g/dL (ref 1.5–4.5)
Glucose: 103 mg/dL — ABNORMAL HIGH (ref 65–99)
POTASSIUM: 5.1 mmol/L (ref 3.5–5.2)
Sodium: 141 mmol/L (ref 134–144)
TOTAL PROTEIN: 6.5 g/dL (ref 6.0–8.5)
Total Bilirubin: 0.6 mg/dL (ref 0.0–1.2)

## 2013-07-08 LAB — NMR, LIPOPROFILE
Cholesterol: 165 mg/dL (ref 100–199)
HDL Cholesterol by NMR: 51 mg/dL (ref >39)
HDL Particle Number: 35.3 umol/L (ref >=30.5)
LDL PARTICLE NUMBER: 1155 nmol/L — AB (ref <1000)
LDL Size: 20.7 nm (ref >20.5)
LDLC SERPL CALC-MCNC: 94 mg/dL (ref 0–99)
LP-IR Score: 36 (ref <=45)
Small LDL Particle Number: 467 nmol/L (ref <=527)
TRIGLYCERIDES BY NMR: 102 mg/dL (ref 0–149)

## 2013-10-08 ENCOUNTER — Encounter: Payer: Self-pay | Admitting: Nurse Practitioner

## 2013-10-08 ENCOUNTER — Ambulatory Visit (INDEPENDENT_AMBULATORY_CARE_PROVIDER_SITE_OTHER): Payer: Commercial Managed Care - HMO | Admitting: Nurse Practitioner

## 2013-10-08 VITALS — BP 143/75 | HR 64 | Temp 97.2°F | Ht 64.0 in | Wt 164.2 lb

## 2013-10-08 DIAGNOSIS — F410 Panic disorder [episodic paroxysmal anxiety] without agoraphobia: Secondary | ICD-10-CM

## 2013-10-08 DIAGNOSIS — I10 Essential (primary) hypertension: Secondary | ICD-10-CM

## 2013-10-08 DIAGNOSIS — J449 Chronic obstructive pulmonary disease, unspecified: Secondary | ICD-10-CM

## 2013-10-08 DIAGNOSIS — K219 Gastro-esophageal reflux disease without esophagitis: Secondary | ICD-10-CM

## 2013-10-08 DIAGNOSIS — J4489 Other specified chronic obstructive pulmonary disease: Secondary | ICD-10-CM

## 2013-10-08 DIAGNOSIS — E785 Hyperlipidemia, unspecified: Secondary | ICD-10-CM

## 2013-10-08 DIAGNOSIS — J309 Allergic rhinitis, unspecified: Secondary | ICD-10-CM

## 2013-10-08 DIAGNOSIS — F411 Generalized anxiety disorder: Secondary | ICD-10-CM

## 2013-10-08 DIAGNOSIS — J438 Other emphysema: Secondary | ICD-10-CM

## 2013-10-08 MED ORDER — ALPRAZOLAM 0.5 MG PO TABS: 0.5000 mg | ORAL_TABLET | Freq: Every day | ORAL | Status: DC

## 2013-10-08 MED ORDER — OMEPRAZOLE 20 MG PO CPDR: 20.0000 mg | DELAYED_RELEASE_CAPSULE | Freq: Every day | ORAL | Status: DC

## 2013-10-08 MED ORDER — FLUTICASONE-SALMETEROL 100-50 MCG/DOSE IN AEPB: 1.0000 | INHALATION_SPRAY | Freq: Two times a day (BID) | RESPIRATORY_TRACT | Status: DC

## 2013-10-08 MED ORDER — LISINOPRIL-HYDROCHLOROTHIAZIDE 10-12.5 MG PO TABS: 1.0000 | ORAL_TABLET | Freq: Every day | ORAL | Status: DC

## 2013-10-08 NOTE — Patient Instructions (Signed)

## 2013-10-08 NOTE — Progress Notes (Signed)
Subjective:    Patient ID: NONNIE PICKNEY, female    DOB: 11-03-1945, 68 y.o.   MRN: [id number]  Patien tin today for follow up of chronic medical problems- no complaints today  Hyperlipidemia This is a chronic problem. The current episode started more than 1 year ago. The problem is uncontrolled. Recent lipid tests were reviewed and are high. She has no history of diabetes or hypothyroidism. Pertinent negatives include no shortness of breath. Treatments tried: was started on statin but patient stopped on her own and is trying just fish oil. Compliance problems include adherence to diet and adherence to exercise.  Risk factors for coronary artery disease include hypertension, obesity and post-menopausal.  Hypertension This is a chronic problem. The current episode started more than 1 year ago. The problem has been gradually improving since onset. The problem is controlled. Pertinent negatives include no headaches, palpitations, peripheral edema or shortness of breath. Risk factors for coronary artery disease include post-menopausal state. Past treatments include ACE inhibitors and diuretics. The current treatment provides significant improvement. There are no compliance problems.   COPD Chronic problem. Managing well. Using Alburterol 2x/week. GAD Chronic problem. No recent panic attacks. SOB is only trigger for anxiety. Taking Xanax once a day. GERD Prilosec OTC- keeps symptoms under control   Review of Systems  Respiratory: Negative for shortness of breath.   Cardiovascular: Negative for palpitations.  Neurological: Negative for headaches.  All other systems reviewed and are negative.      Objective:   Physical Exam  Constitutional: She is oriented to person, place, and time. She appears well-developed and well-nourished.  HENT:  Nose: Nose normal.  Mouth/Throat: Oropharynx is clear and moist.  Eyes: EOM are normal.  Neck: Trachea normal, normal range of motion and full passive  range of motion without pain. Neck supple. No JVD present. Carotid bruit is not present. No thyromegaly present.  Cardiovascular: Normal rate, regular rhythm, normal heart sounds and intact distal pulses.  Exam reveals no gallop and no friction rub.   No murmur heard. Pulmonary/Chest: Effort normal and breath sounds normal.  Abdominal: Soft. Bowel sounds are normal. She exhibits no distension and no mass. There is no tenderness.  Musculoskeletal: Normal range of motion.  Lymphadenopathy:    She has no cervical adenopathy.  Neurological: She is alert and oriented to person, place, and time. She has normal reflexes.  Skin: Skin is warm and dry.  Psychiatric: She has a normal mood and affect. Her behavior is normal. Judgment and thought content normal.   BP 143/75  Pulse 64  Temp(Src) 97.2 F (36.2 C) (Oral)  Ht _0  (1.626 m)  Wt 164 lb 3.2 oz (74.481 kg)  BMI 28.17 kg/m2         Assessment & Plan:   1. Hyperlipidemia with target LDL less than 100  - NMR, lipoprofile Lipitor 57m daily   2. Gastroesophageal reflux disease, esophagitis presence not specifie - omeprazole (PRILOSEC) 20 MG capsule; Take 1 capsule (20 mg total) by mouth daily.  Dispense: 90 capsule; Refill: 1  3. GAD (generalized anxiety disorder) - ALPRAZolam (XANAX) 0.5 MG tablet; Take 1 tablet (0.5 mg total) by mouth daily.  Dispense: 30 tablet; Refill: 1 - CMP14+EGFR  4. Essential hypertension, benign - lisinopril-hydrochlorothiazide (PRINZIDE,ZESTORETIC) 10-12.5 MG per tablet; Take 1 tablet by mouth daily.  Dispense: 90 tablet; Refill: 1  5. ALLERGIC RHINITIS  6.  COPD Takes advair  7. Chronic obstructive pulmonary disease, unspecified COPD, unspecified chronic bronchitis type -  Fluticasone-Salmeterol (ADVAIR DISKUS) 100-50 MCG/DOSE AEPB; Inhale 1 puff into the lungs 2 (two) times daily.  Dispense: 180 each; Refill: 1  hemoccult cards given to patient with directions  Discussed weight management for  patient with BMI> 25 Labs pending Health maintenance reviewed Diet and exercise encouraged Continue all meds Follow up  In 3 months PRN   Mary-Margaret Hassell Done, FNP

## 2013-10-09 ENCOUNTER — Telehealth: Payer: Self-pay | Admitting: Family Medicine

## 2013-10-09 LAB — NMR, LIPOPROFILE
Cholesterol: 166 mg/dL (ref 100–199)
HDL Cholesterol by NMR: 47 mg/dL (ref >39)
HDL Particle Number: 31.3 umol/L (ref >=30.5)
LDL PARTICLE NUMBER: 1292 nmol/L — AB (ref <1000)
LDL Size: 21 nm (ref >20.5)
LDLC SERPL CALC-MCNC: 104 mg/dL — ABNORMAL HIGH (ref 0–99)
LP-IR Score: 37 (ref <=45)
Small LDL Particle Number: 347 nmol/L (ref <=527)
TRIGLYCERIDES BY NMR: 77 mg/dL (ref 0–149)

## 2013-10-09 LAB — CMP14+EGFR
A/G RATIO: 2 (ref 1.1–2.5)
ALT: 13 IU/L (ref 0–32)
AST: 15 IU/L (ref 0–40)
Albumin: 4.6 g/dL (ref 3.6–4.8)
Alkaline Phosphatase: 48 IU/L (ref 39–117)
BUN/Creatinine Ratio: 21 (ref 11–26)
BUN: 20 mg/dL (ref 8–27)
CALCIUM: 10 mg/dL (ref 8.7–10.3)
CHLORIDE: 100 mmol/L (ref 97–108)
CO2: 26 mmol/L (ref 18–29)
Creatinine, Ser: 0.96 mg/dL (ref 0.57–1.00)
GFR, EST AFRICAN AMERICAN: 71 mL/min/{1.73_m2} (ref >59)
GFR, EST NON AFRICAN AMERICAN: 61 mL/min/{1.73_m2} (ref >59)
GLUCOSE: 108 mg/dL — AB (ref 65–99)
Globulin, Total: 2.3 g/dL (ref 1.5–4.5)
Potassium: 4.7 mmol/L (ref 3.5–5.2)
Sodium: 141 mmol/L (ref 134–144)
TOTAL PROTEIN: 6.9 g/dL (ref 6.0–8.5)
Total Bilirubin: 0.7 mg/dL (ref 0.0–1.2)

## 2013-10-09 NOTE — Telephone Encounter (Signed)
Message copied by Waverly Ferrari on Thu Oct 09, 2013  9:38 AM ------      Message from: Chevis Pretty      Created: Thu Oct 09, 2013  8:13 AM       Kidney and liver function stable      LDL particle numbers and LDL are elevated form last time but are still ok      Continue current meds- low fat diet and exercise and recheck in 3 months       ------

## 2013-10-13 ENCOUNTER — Encounter: Payer: Self-pay | Admitting: *Deleted

## 2013-11-11 ENCOUNTER — Ambulatory Visit: Payer: Commercial Managed Care - HMO

## 2013-11-18 ENCOUNTER — Ambulatory Visit (INDEPENDENT_AMBULATORY_CARE_PROVIDER_SITE_OTHER): Payer: Commercial Managed Care - HMO

## 2013-11-18 DIAGNOSIS — Z23 Encounter for immunization: Secondary | ICD-10-CM

## 2014-01-09 ENCOUNTER — Ambulatory Visit (INDEPENDENT_AMBULATORY_CARE_PROVIDER_SITE_OTHER): Payer: Commercial Managed Care - HMO

## 2014-01-09 ENCOUNTER — Encounter: Payer: Self-pay | Admitting: Nurse Practitioner

## 2014-01-09 ENCOUNTER — Ambulatory Visit (INDEPENDENT_AMBULATORY_CARE_PROVIDER_SITE_OTHER): Payer: Commercial Managed Care - HMO | Admitting: Nurse Practitioner

## 2014-01-09 VITALS — BP 136/82 | HR 71 | Temp 97.3°F | Ht 64.0 in | Wt 164.0 lb

## 2014-01-09 DIAGNOSIS — K219 Gastro-esophageal reflux disease without esophagitis: Secondary | ICD-10-CM

## 2014-01-09 DIAGNOSIS — J449 Chronic obstructive pulmonary disease, unspecified: Secondary | ICD-10-CM

## 2014-01-09 DIAGNOSIS — E785 Hyperlipidemia, unspecified: Secondary | ICD-10-CM

## 2014-01-09 DIAGNOSIS — F411 Generalized anxiety disorder: Secondary | ICD-10-CM

## 2014-01-09 DIAGNOSIS — IMO0001 Reserved for inherently not codable concepts without codable children: Secondary | ICD-10-CM

## 2014-01-09 DIAGNOSIS — Z13820 Encounter for screening for osteoporosis: Secondary | ICD-10-CM

## 2014-01-09 DIAGNOSIS — Z1212 Encounter for screening for malignant neoplasm of rectum: Secondary | ICD-10-CM

## 2014-01-09 DIAGNOSIS — I10 Essential (primary) hypertension: Secondary | ICD-10-CM

## 2014-01-09 DIAGNOSIS — F410 Panic disorder [episodic paroxysmal anxiety] without agoraphobia: Secondary | ICD-10-CM

## 2014-01-09 MED ORDER — ALPRAZOLAM 0.5 MG PO TABS: 0.5000 mg | ORAL_TABLET | Freq: Every day | ORAL | Status: DC

## 2014-01-09 MED ORDER — LISINOPRIL-HYDROCHLOROTHIAZIDE 10-12.5 MG PO TABS: 1.0000 | ORAL_TABLET | Freq: Every day | ORAL | Status: DC

## 2014-01-09 MED ORDER — OMEPRAZOLE 20 MG PO CPDR: 20.0000 mg | DELAYED_RELEASE_CAPSULE | Freq: Every day | ORAL | Status: DC

## 2014-01-09 NOTE — Patient Instructions (Signed)

## 2014-01-09 NOTE — Progress Notes (Signed)
Subjective:    Patient ID: Gabriela Wilson, female    DOB: 1945/09/12, 68 y.o.   MRN: [id number]  Patien tin today for follow up of chronic medical problems- no complaints today  Hyperlipidemia This is a chronic problem. The current episode started more than 1 year ago. The problem is controlled. She has no history of diabetes, hypothyroidism or obesity. Pertinent negatives include no shortness of breath. Current antihyperlipidemic treatment includes statins. The current treatment provides moderate improvement of lipids. Compliance problems include adherence to diet and adherence to exercise.  Risk factors for coronary artery disease include dyslipidemia, hypertension and post-menopausal.  Hypertension This is a chronic problem. The current episode started more than 1 year ago. The problem is controlled. Pertinent negatives include no blurred vision, headaches, palpitations, peripheral edema or shortness of breath. Risk factors for coronary artery disease include dyslipidemia, family history and post-menopausal state. Past treatments include ACE inhibitors and diuretics. The current treatment provides moderate improvement. Compliance problems include diet and exercise.   COPD Chronic problem. Managing well. Using Alburterol 2x/week. GAD Chronic problem. No recent panic attacks. SOB is only trigger for anxiety. Taking Xanax once a day. GERD Prilosec OTC- keeps symptoms under control   Review of Systems  Constitutional: Negative.   HENT: Negative.   Eyes: Negative for blurred vision.  Respiratory: Negative for shortness of breath.   Cardiovascular: Negative for palpitations.  Genitourinary: Negative.   Neurological: Negative for headaches.  Psychiatric/Behavioral: Negative.   All other systems reviewed and are negative.      Objective:   Physical Exam  Constitutional: She is oriented to person, place, and time. She appears well-developed and well-nourished.  HENT:  Nose: Nose normal.   Mouth/Throat: Oropharynx is clear and moist.  Eyes: EOM are normal.  Neck: Trachea normal, normal range of motion and full passive range of motion without pain. Neck supple. No JVD present. Carotid bruit is not present. No thyromegaly present.  Cardiovascular: Normal rate, regular rhythm, normal heart sounds and intact distal pulses.  Exam reveals no gallop and no friction rub.   No murmur heard. Pulmonary/Chest: Effort normal and breath sounds normal.  Abdominal: Soft. Bowel sounds are normal. She exhibits no distension and no mass. There is no tenderness.  Musculoskeletal: Normal range of motion.  Lymphadenopathy:    She has no cervical adenopathy.  Neurological: She is alert and oriented to person, place, and time. She has normal reflexes.  Skin: Skin is warm and dry.  Psychiatric: She has a normal mood and affect. Her behavior is normal. Judgment and thought content normal.   BP 136/82 mmHg  Pulse 71  Temp(Src) 97.3 F (36.3 C) (Oral)  Ht _0  (1.626 m)  Wt 164 lb (74.39 kg)  BMI 28.14 kg/m2   Chest x ray- chronic bronchitic changes-Preliminary reading by Ronnald Collum, FNP  [place], FNP        Assessment & Plan:  1. Screening for malignant neoplasm of the rectum - Fecal occult blood, imunochemical  2. Essential hypertension, benign Do not add salt to diet - EKG 12-Lead - CMP14+EGFR - lisinopril-hydrochlorothiazide (PRINZIDE,ZESTORETIC) 10-12.5 MG per tablet; Take 1 tablet by mouth daily.  Dispense: 90 tablet; Refill: 1  3. Gastroesophageal reflux disease, esophagitis presence not specified Avoid spicy foods - omeprazole (PRILOSEC) 20 MG capsule; Take 1 capsule (20 mg total) by mouth daily.  Dispense: 90 capsule; Refill: 1  4. GAD (generalized anxiety disorder) Stress management - ALPRAZolam (XANAX) 0.5 MG  tablet; Take 1 tablet (0.5 mg total) by mouth daily.  Dispense: 30 tablet; Refill: 1  5. COPD bronchitis - DG Chest 2 View;  Future  6. Hyperlipidemia with target LDL less than 100 Low fat diet - NMR, lipoprofile  7. PANIC DISORDER  8. Screening for osteoporosis Weight bearing exercises - DG Bone Density; Future   Labs pending Health maintenance reviewed Diet and exercise encouraged Continue all meds Follow up  In 3 months   [place], FNP

## 2014-01-10 LAB — NMR, LIPOPROFILE
Cholesterol: 183 mg/dL (ref 100–199)
HDL Cholesterol by NMR: 49 mg/dL (ref >39)
HDL Particle Number: 34.8 umol/L (ref >=30.5)
LDL PARTICLE NUMBER: 1629 nmol/L — AB (ref <1000)
LDL Size: 21 nm (ref >20.5)
LDL-C: 115 mg/dL — AB (ref 0–99)
LP-IR Score: 36 (ref <=45)
Small LDL Particle Number: 768 nmol/L — ABNORMAL HIGH (ref <=527)
Triglycerides by NMR: 97 mg/dL (ref 0–149)

## 2014-01-10 LAB — CMP14+EGFR
ALBUMIN: 4.4 g/dL (ref 3.6–4.8)
ALK PHOS: 46 IU/L (ref 39–117)
ALT: 16 IU/L (ref 0–32)
AST: 19 IU/L (ref 0–40)
Albumin/Globulin Ratio: 1.9 (ref 1.1–2.5)
BUN / CREAT RATIO: 17 (ref 11–26)
BUN: 14 mg/dL (ref 8–27)
CHLORIDE: 100 mmol/L (ref 97–108)
CO2: 26 mmol/L (ref 18–29)
Calcium: 9.7 mg/dL (ref 8.7–10.3)
Creatinine, Ser: 0.82 mg/dL (ref 0.57–1.00)
GFR calc Af Amer: 86 mL/min/{1.73_m2} (ref >59)
GFR calc non Af Amer: 74 mL/min/{1.73_m2} (ref >59)
GLUCOSE: 100 mg/dL — AB (ref 65–99)
Globulin, Total: 2.3 g/dL (ref 1.5–4.5)
Potassium: 4.6 mmol/L (ref 3.5–5.2)
Sodium: 141 mmol/L (ref 134–144)
Total Bilirubin: 0.7 mg/dL (ref 0.0–1.2)
Total Protein: 6.7 g/dL (ref 6.0–8.5)

## 2014-01-11 LAB — FECAL OCCULT BLOOD, IMMUNOCHEMICAL: Fecal Occult Bld: NEGATIVE

## 2014-02-03 ENCOUNTER — Encounter: Payer: Self-pay | Admitting: *Deleted

## 2014-03-11 ENCOUNTER — Other Ambulatory Visit: Payer: Commercial Managed Care - HMO

## 2014-03-11 ENCOUNTER — Ambulatory Visit: Payer: Commercial Managed Care - HMO

## 2014-04-23 ENCOUNTER — Ambulatory Visit (INDEPENDENT_AMBULATORY_CARE_PROVIDER_SITE_OTHER): Payer: Commercial Managed Care - HMO | Admitting: Nurse Practitioner

## 2014-04-23 ENCOUNTER — Encounter: Payer: Self-pay | Admitting: Nurse Practitioner

## 2014-04-23 VITALS — BP 126/83 | HR 60 | Temp 97.8°F | Ht 64.0 in | Wt 167.4 lb

## 2014-04-23 DIAGNOSIS — I10 Essential (primary) hypertension: Secondary | ICD-10-CM

## 2014-04-23 DIAGNOSIS — K219 Gastro-esophageal reflux disease without esophagitis: Secondary | ICD-10-CM

## 2014-04-23 DIAGNOSIS — F411 Generalized anxiety disorder: Secondary | ICD-10-CM

## 2014-04-23 DIAGNOSIS — J449 Chronic obstructive pulmonary disease, unspecified: Secondary | ICD-10-CM

## 2014-04-23 DIAGNOSIS — E785 Hyperlipidemia, unspecified: Secondary | ICD-10-CM | POA: Diagnosis not present

## 2014-04-23 DIAGNOSIS — IMO0001 Reserved for inherently not codable concepts without codable children: Secondary | ICD-10-CM

## 2014-04-23 MED ORDER — LISINOPRIL-HYDROCHLOROTHIAZIDE 10-12.5 MG PO TABS: 1.0000 | ORAL_TABLET | Freq: Every day | ORAL | Status: DC

## 2014-04-23 MED ORDER — OMEPRAZOLE 20 MG PO CPDR: 20.0000 mg | DELAYED_RELEASE_CAPSULE | Freq: Every day | ORAL | Status: DC

## 2014-04-23 MED ORDER — ALPRAZOLAM 0.5 MG PO TABS: 0.5000 mg | ORAL_TABLET | Freq: Every day | ORAL | Status: DC

## 2014-04-23 MED ORDER — ATORVASTATIN CALCIUM 40 MG PO TABS: 40.0000 mg | ORAL_TABLET | Freq: Every day | ORAL | Status: DC

## 2014-04-23 NOTE — Patient Instructions (Signed)
Exercise to Stay Healthy Exercise helps you become and stay healthy. EXERCISE IDEAS AND TIPS Choose exercises that:  You enjoy.  Fit into your day. You do not need to exercise really hard to be healthy. You can do exercises at a slow or medium level and stay healthy. You can:  Stretch before and after working out.  Try yoga, Pilates, or tai chi.  Lift weights.  Walk fast, swim, jog, run, climb stairs, bicycle, dance, or rollerskate.  Take aerobic classes. Exercises that burn about 150 calories:  Running 1  miles in 15 minutes.  Playing volleyball for 45 to 60 minutes.  Washing and waxing a car for 45 to 60 minutes.  Playing touch football for 45 minutes.  Walking 1  miles in 35 minutes.  Pushing a stroller 1  miles in 30 minutes.  Playing basketball for 30 minutes.  Raking leaves for 30 minutes.  Bicycling 5 miles in 30 minutes.  Walking 2 miles in 30 minutes.  Dancing for 30 minutes.  Shoveling snow for 15 minutes.  Swimming laps for 20 minutes.  Walking up stairs for 15 minutes.  Bicycling 4 miles in 15 minutes.  Gardening for 30 to 45 minutes.  Jumping rope for 15 minutes.  Washing windows or floors for 45 to 60 minutes. Document Released: 02/11/2010 Document Revised: 04/03/2011 Document Reviewed: 02/11/2010 [employer] Patient Information 2015 [employer]. This information is not intended to replace advice given to you by your health care provider. Make sure you discuss any questions you have with your health care provider.  

## 2014-04-23 NOTE — Progress Notes (Signed)
Subjective:    Patient ID: Gabriela Wilson, female    DOB: 10-Dec-1945, 69 y.o.   MRN: [id number]   Patient here today for follow up of chronic medical problems.   Hyperlipidemia This is a chronic problem. The current episode started more than 1 year ago. The problem is controlled. Recent lipid tests were reviewed and are normal. She has no history of diabetes, hypothyroidism or obesity. Current antihyperlipidemic treatment includes statins. The current treatment provides moderate improvement of lipids. Compliance problems include adherence to diet and adherence to exercise.  Risk factors for coronary artery disease include dyslipidemia, hypertension and post-menopausal.  Hypertension This is a chronic problem. The current episode started more than 1 year ago. The problem is unchanged. Pertinent negatives include no headaches. Risk factors for coronary artery disease include dyslipidemia and post-menopausal state. Past treatments include ACE inhibitors and diuretics. The current treatment provides moderate improvement. There is no history of CAD/MI or CVA.  GERD Omeprazole daily keeps symptoms under control. COPD Currently on advair BID- has not needed albuterol over the last several months GAD Xanax BID- keeps her calm - gets very anxious if does not take.       Review of Systems  Constitutional: Negative.   HENT: Negative.   Respiratory: Negative.   Cardiovascular: Negative.   Genitourinary: Negative.   Neurological: Negative for headaches.  Psychiatric/Behavioral: Negative.   All other systems reviewed and are negative.      Objective:   Physical Exam  Constitutional: She is oriented to person, place, and time. She appears well-developed and well-nourished.  HENT:  Nose: Nose normal.  Mouth/Throat: Oropharynx is clear and moist.  Eyes: EOM are normal.  Neck: Trachea normal, normal range of motion and full passive range of motion without pain. Neck supple. No JVD present.  Carotid bruit is not present. No thyromegaly present.  Cardiovascular: Normal rate, regular rhythm, normal heart sounds and intact distal pulses.  Exam reveals no gallop and no friction rub.   No murmur heard. Pulmonary/Chest: Effort normal and breath sounds normal.  Abdominal: Soft. Bowel sounds are normal. She exhibits no distension and no mass. There is no tenderness.  Musculoskeletal: Normal range of motion.  Lymphadenopathy:    She has no cervical adenopathy.  Neurological: She is alert and oriented to person, place, and time. She has normal reflexes.  Skin: Skin is warm and dry.  1cm  Mildly erythematous lesion left auricle  Psychiatric: She has a normal mood and affect. Her behavior is normal. Judgment and thought content normal.   BP 126/83 mmHg  Pulse 60  Temp(Src) 97.8 F (36.6 C) (Oral)  Ht _0  (1.626 m)  Wt 167 lb 6.4 oz (75.932 kg)  BMI 28.72 kg/m2         Assessment & Plan:  1. Gastroesophageal reflux disease, esophagitis presence not specified Avoid spicy foods Do not 2 hours prior to bedtime - omeprazole (PRILOSEC) 20 MG capsule; Take 1 capsule (20 mg total) by mouth daily.  Dispense: 90 capsule; Refill: 1  2. Essential hypertension, benign Do not add slat to diet - lisinopril-hydrochlorothiazide (PRINZIDE,ZESTORETIC) 10-12.5 MG per tablet; Take 1 tablet by mouth daily.  Dispense: 90 tablet; Refill: 1 - CMP14+EGFR  3. GAD (generalized anxiety disorder) Stress management - ALPRAZolam (XANAX) 0.5 MG tablet; Take 1 tablet (0.5 mg total) by mouth daily.  Dispense: 30 tablet; Refill: 1  4. Hyperlipidemia with target LDL less than 100 Low fat diet - NMR, lipoprofile - atorvastatin (LIPITOR) 40  MG tablet; Take 1 tablet (40 mg total) by mouth daily.  Dispense: 90 tablet; Refill: 1  5. COPD bronchitis Continue advair as rx.   Refuses prevnar today- will think about it for next visit Labs pending Health maintenance reviewed Diet and exercise  encouraged Continue all meds Follow up  In 3 months   [place], FNP

## 2014-04-24 LAB — CMP14+EGFR
ALBUMIN: 4.2 g/dL (ref 3.6–4.8)
ALK PHOS: 50 IU/L (ref 39–117)
ALT: 16 IU/L (ref 0–32)
AST: 18 IU/L (ref 0–40)
Albumin/Globulin Ratio: 1.8 (ref 1.1–2.5)
BUN/Creatinine Ratio: 13 (ref 11–26)
BUN: 12 mg/dL (ref 8–27)
Bilirubin Total: 0.9 mg/dL (ref 0.0–1.2)
CALCIUM: 9.9 mg/dL (ref 8.7–10.3)
CHLORIDE: 101 mmol/L (ref 97–108)
CO2: 26 mmol/L (ref 18–29)
CREATININE: 0.89 mg/dL (ref 0.57–1.00)
GFR calc Af Amer: 78 mL/min/{1.73_m2} (ref >59)
GFR calc non Af Amer: 67 mL/min/{1.73_m2} (ref >59)
GLOBULIN, TOTAL: 2.3 g/dL (ref 1.5–4.5)
GLUCOSE: 87 mg/dL (ref 65–99)
Potassium: 4.1 mmol/L (ref 3.5–5.2)
Sodium: 141 mmol/L (ref 134–144)
TOTAL PROTEIN: 6.5 g/dL (ref 6.0–8.5)

## 2014-04-24 LAB — NMR, LIPOPROFILE
Cholesterol: 175 mg/dL (ref 100–199)
HDL Cholesterol by NMR: 52 mg/dL (ref >39)
HDL PARTICLE NUMBER: 32.3 umol/L (ref >=30.5)
LDL PARTICLE NUMBER: 1260 nmol/L — AB (ref <1000)
LDL Size: 21.1 nm (ref >20.5)
LDL-C: 105 mg/dL — ABNORMAL HIGH (ref 0–99)
LP-IR Score: 31 (ref <=45)
SMALL LDL PARTICLE NUMBER: 413 nmol/L (ref <=527)
TRIGLYCERIDES BY NMR: 91 mg/dL (ref 0–149)

## 2014-06-10 ENCOUNTER — Other Ambulatory Visit: Payer: Self-pay | Admitting: Nurse Practitioner

## 2014-06-10 ENCOUNTER — Ambulatory Visit (INDEPENDENT_AMBULATORY_CARE_PROVIDER_SITE_OTHER): Payer: Commercial Managed Care - HMO | Admitting: Pharmacist

## 2014-06-10 ENCOUNTER — Ambulatory Visit (INDEPENDENT_AMBULATORY_CARE_PROVIDER_SITE_OTHER): Payer: Commercial Managed Care - HMO

## 2014-06-10 ENCOUNTER — Encounter: Payer: Self-pay | Admitting: Pharmacist

## 2014-06-10 VITALS — BP 130/80 | HR 60 | Ht 64.0 in | Wt 164.0 lb

## 2014-06-10 DIAGNOSIS — Z0000 Encounter for general adult medical examination without abnormal findings: Secondary | ICD-10-CM

## 2014-06-10 DIAGNOSIS — Z13820 Encounter for screening for osteoporosis: Secondary | ICD-10-CM

## 2014-06-10 DIAGNOSIS — E785 Hyperlipidemia, unspecified: Secondary | ICD-10-CM

## 2014-06-10 DIAGNOSIS — M8580 Other specified disorders of bone density and structure, unspecified site: Secondary | ICD-10-CM

## 2014-06-10 DIAGNOSIS — M899 Disorder of bone, unspecified: Secondary | ICD-10-CM | POA: Diagnosis not present

## 2014-06-10 MED ORDER — ATORVASTATIN CALCIUM 40 MG PO TABS: 20.0000 mg | ORAL_TABLET | Freq: Every day | ORAL | Status: DC

## 2014-06-10 NOTE — Progress Notes (Signed)
Patient ID: GENEVIE ELMAN, female   DOB: 08-22-1945, 69 y.o.   MRN: [id number]    Subjective:   ANDRENA MARGERUM is a 69 y.o. female who presents for an Initial Medicare Annual Wellness Visit and osteopenia - review DEXA results.  Current Medications (verified) Outpatient Encounter Prescriptions as of 06/10/2014  Medication Sig  . ALPRAZolam (XANAX) 0.5 MG tablet Take 1 tablet (0.5 mg total) by mouth daily.  Marland Kitchen atorvastatin (LIPITOR) 40 MG tablet Take 1 tablet (40 mg total) by mouth daily. (Patient taking differently: Take 20 mg by mouth daily. )  . Fluticasone-Salmeterol (ADVAIR DISKUS) 100-50 MCG/DOSE AEPB Inhale 1 puff into the lungs 2 (two) times daily.  Marland Kitchen lisinopril-hydrochlorothiazide (PRINZIDE,ZESTORETIC) 10-12.5 MG per tablet Take 1 tablet by mouth daily.  Marland Kitchen omeprazole (PRILOSEC) 20 MG capsule Take 1 capsule (20 mg total) by mouth daily.  Marland Kitchen albuterol (PROAIR HFA) 108 (90 BASE) MCG/ACT inhaler Inhale 2 puffs into the lungs every 6 (six) hours as needed. (Patient not taking: Reported on 06/10/2014)   No facility-administered encounter medications on file as of 06/10/2014.    Allergies (verified) Review of patient's allergies indicates no known allergies.   History: Past Medical History  Diagnosis Date  . COPD (chronic obstructive pulmonary disease)   . Hypertension   . Asthma   . Hyperlipidemia   . Osteopenia    Past Surgical History  Procedure Laterality Date  . Tubal ligation     Family History  Problem Relation Age of Onset  . Colon cancer Neg Hx   . Asthma Mother   . Hypertension Mother   . Dementia Mother   . Diabetes Father   . COPD Father   . Diabetes Sister   . Hypertension Sister   . Diabetes Brother   . Early death Brother     MVA with quadrapelgic  . Diabetes Brother   . Diabetes Brother   . Cancer Sister     vulva   Social History   Occupational History  . Not on file.   Social History Main Topics  . Smoking status: Former Smoker    Quit date:  01/24/1992  . Smokeless tobacco: Never Used  . Alcohol Use: Yes     Comment: wine about every 2 weeks  . Drug Use: No  . Sexual Activity: Yes    Do you feel safe at home?  Yes  Dietary issues and exercise activities: Current Exercise Habits:: Home exercise routine, Type of exercise: walking, Time (Minutes): 10, Frequency (Times/Week): 2, Weekly Exercise (Minutes/Week): 20, Intensity: Moderate  Current Dietary habits:  Patient is trying to lose a little weight.  She has recently increased fruits and vegetables.  More salads and lean proteins  Objective:    Today's Vitals   06/10/14 1113  BP: 130/80  Pulse: 60  Height: 5\' 4"  (1.626 m)  Weight: 164 lb (74.39 kg)   Body mass index is 28.14 kg/(m^2).  Current Height: Height: 5\' 4"  (162.6 cm)      Max Lifetime Height:  5\' 4"    DEXA Results Date of Test T-Score for AP Spine L1-L4 T-Score for Total Left Hip T-Score for Total Right Hip T-Score for Neck of left hip T-Score for neck of right hip  06/10/2014 0.5 -0.7 -0.8 -1.6 -1.5  10/26/2010 0.9 -0.3 -0.4 -1.2 -1.3                 FRAX 10 year estimate: Total FX risk:  9.7%(consider medication if >/= 20%) Hip FX  risk:  1.3%  (consider medication if >/= 3%)  Activities of Daily Living In your present state of health, do you have any difficulty performing the following activities: 06/10/2014 04/23/2014  Hearing? Tempie Donning  Vision? N Y  Difficulty concentrating or making decisions? N N  Walking or climbing stairs? N N  Dressing or bathing? N N  Doing errands, shopping? N N  Preparing Food and eating ? N -  Using the Toilet? N -  In the past six months, have you accidently leaked urine? N -  Do you have problems with loss of bowel control? N -  Managing your Medications? N -  Managing your Finances? N -  Housekeeping or managing your Housekeeping? N -    Are there smokers in your home (other than you)? No   Cardiac Risk Factors include: advanced age (>27men, >29  women);dyslipidemia;hypertension  Depression Screen PHQ 2/9 Scores 06/10/2014 04/23/2014 01/09/2014 10/08/2013  PHQ - 2 Score 0 0 0 1  Exception Documentation - Patient refusal - -    Fall Risk Fall Risk  06/10/2014 04/23/2014 01/09/2014 10/08/2013 07/07/2013  Falls in the past year? No No No No No    Cognitive Function: MMSE - Mini Mental State Exam 06/10/2014  Orientation to time 5  Orientation to Place 5  Registration 3  Attention/ Calculation 4  Recall 3  Language- name 2 objects 2  Language- repeat 1  Language- follow 3 step command 3  Language- read & follow direction 1  Write a sentence 1  Copy design 1  Total score 29    Immunizations and Health Maintenance Immunization History  Administered Date(s) Administered  . Influenza,inj,Quad PF,36+ Mos 12/13/2012, 11/18/2013   There are no preventive care reminders to display for this patient.  Patient Care Team: Chevis Pretty, FNP as PCP - General (Nurse Practitioner)  Indicate any recent Medical Services you may have received from other than [facility] providers in the past year (date may be approximate).    Assessment:    Annual Wellness Visit    Screening Tests Health Maintenance  Topic Date Due  . TETANUS/TDAP  07/08/2014 (Originally 01/23/2013)  . PNA vac Low Risk Adult (1 of 2 - PCV13) 07/23/2014 (Originally 06/01/2011)  . ZOSTAVAX  10/23/2014 (Originally 06/01/2006)  . INFLUENZA VACCINE  08/24/2014  . COLON CANCER SCREENING ANNUAL FOBT  01/10/2015  . MAMMOGRAM  04/21/2015  . DEXA SCAN  06/09/2016  . COLONOSCOPY  10/27/2020        Plan:   During the course of the visit Avi was educated and counseled about the following appropriate screening and preventive services:   Vaccines to include Pneumoccal, Influenza, Hepatitis B, Td, Zostavax - UTD on influenza.  Patient refused other vaccines  Colorectal cancer screening - FOBT and colonscopy UTD  Cardiovascular disease screening - LDL was slightly elevated at  last check.  BP at goal.  Diabetes screening - Last FBG was WNL when checked 04/23/2014  Bone Denisty / Osteoporosis Screening - discussed today.  Recommended increase calcium intake.  Will check vitamin D with next labs.   Fall prevention discussed  Mammogram - UTD  Glaucoma screening / Eye Exam - Recommended have comprehensive eye exam.  Nutrition counseling - discussed limiting caloric intake by increasing non starchy vegetables, fruits and whole grains.  Limit sweets, processed foods and red meat.  Advanced Directives -patient declined information at first and then decided to take information packet.   Weight bearing exercise - consider restarting [employer] program.  Reviewed inhaler technique and which were rescue versus maintance inhalers.    Goals    . Exercise 150 minutes per week (moderate activity)    . Reduce calorie intake to 1500 calories per day      Increase non-starchy vegetables - carrots, green bean, squash, zucchini, tomatoes, onions, peppers, spinach and other green leafy vegetables, cabbage, lettuce, cucumbers, asparagus, okra (not fried), eggplant limit sugar and processed foods (cakes, cookies, ice cream, crackers and chips) Increase fresh fruit but limit serving sizes 1/2 cup or about the size of tennis or baseball limit red meat to no more than 1-2 times per week (serving size about the size of your palm) Choose whole grains / lean proteins - whole wheat bread, quinoa, whole grain rice (1/2 cup), fish, chicken, [place]        Patient Instructions (the written plan) were given to the patient.   Cherre Robins, [place], CPP, CDE   06/10/2014

## 2014-06-10 NOTE — Patient Instructions (Addendum)
Gabriela Wilson , Thank you for taking time to come for your Medicare Wellness Visit. I appreciate your ongoing commitment to your health goals. Please review the following plan we discussed and let me know if I can assist you in the future.   These are the goals we discussed: Goals    . Exercise 150 minutes per week (moderate activity)    . Reduce calorie intake to 1500 calories per day      Increase non-starchy vegetables - carrots, green bean, squash, zucchini, tomatoes, onions, peppers, spinach and other green leafy vegetables, cabbage, lettuce, cucumbers, asparagus, okra (not fried), eggplant limit sugar and processed foods (cakes, cookies, ice cream, crackers and chips) Increase fresh fruit but limit serving sizes 1/2 cup or about the size of tennis or baseball limit red meat to no more than 1-2 times per week (serving size about the size of your palm) Choose whole grains / lean proteins - whole wheat bread, quinoa, whole grain rice (1/2 cup), fish, chicken, [place]        This is a list of the screening recommended for you and due dates:  Health Maintenance  Topic Date Due  . Tetanus Vaccine  Due now  . Pneumonia vaccines (1 of 2 - PCV13) Due now  . Shingles Vaccine  Due now  . Flu Shot  08/24/2014  . Stool Blood Test  01/10/2015  . Mammogram  04/21/2015  . DEXA scan (bone density measurement)  06/09/2016  . Colon Cancer Screening  10/27/2020  *Topic was postponed. The date shown is not the original due date.    Fall Prevention and Home Safety Falls cause injuries and can affect all age groups. It is possible to use preventive measures to significantly decrease the likelihood of falls. There are many simple measures which can make your home safer and prevent falls. OUTDOORS  Repair cracks and edges of walkways and driveways.  Remove high doorway thresholds.  Trim shrubbery on the main path into your home.  Have good outside lighting.  Clear walkways of tools, rocks,  debris, and clutter.  Check that handrails are not broken and are securely fastened. Both sides of steps should have handrails.  Have leaves, snow, and ice cleared regularly.  Use sand or salt on walkways during winter months.  In the garage, clean up grease or oil spills.  Be careful about tree with acorns and gum balls - try to park away from these  Try to avoid gravel parking lots  BATHROOM  Install night lights.  Install grab bars by the toilet and in the tub and shower.  Use non-skid mats or decals in the tub or shower.  Place a plastic non-slip stool in the shower to sit on, if needed.  Keep floors dry and clean up all water on the floor immediately.  Remove soap buildup in the tub or shower on a regular basis.  Secure bath mats with non-slip, double-sided rug tape.  Remove throw rugs and tripping hazards from the floors. BEDROOMS  Install night lights.  Make sure a bedside light is easy to reach.  Do not use oversized bedding.  Keep a telephone by your bedside.  Have a firm chair with side arms to use for getting dressed.  Remove throw rugs and tripping hazards from the floor. KITCHEN  Keep handles on pots and pans turned toward the center of the stove. Use back burners when possible.  Clean up spills quickly and allow time for drying.  Avoid walking  on wet floors.  Avoid hot utensils and knives.  Position shelves so they are not too high or low.  Place commonly used objects within easy reach.  If necessary, use a sturdy step stool with a grab bar when reaching.  Keep electrical cables out of the way.  Do not use floor polish or wax that makes floors slippery. If you must use wax, use non-skid floor wax.  Remove throw rugs and tripping hazards from the floor. STAIRWAYS  Never leave objects on stairs.  Place handrails on both sides of stairways and use them. Fix any loose handrails. Make sure handrails on both sides of the stairways are as  long as the stairs.  Check carpeting to make sure it is firmly attached along stairs. Make repairs to worn or loose carpet promptly.  Avoid placing throw rugs at the top or bottom of stairways, or properly secure the rug with carpet tape to prevent slippage. Get rid of throw rugs, if possible.  Have an electrician put in a light switch at the top and bottom of the stairs. OTHER FALL PREVENTION TIPS  Wear low-heel or rubber-soled shoes that are supportive and fit well. Wear closed toe shoes.  When using a stepladder, make sure it is fully opened and both spreaders are firmly locked. Do not climb a closed stepladder.  Add color or contrast paint or tape to grab bars and handrails in your home. Place contrasting color strips on first and last steps.  Learn and use mobility aids as needed. Install an electrical emergency response system.  Turn on lights to avoid dark areas. Replace light bulbs that burn out immediately. Get light switches that glow.  Arrange furniture to create clear pathways. Keep furniture in the same place.  Firmly attach carpet with non-skid or double-sided tape.  Eliminate uneven floor surfaces.  Select a carpet pattern that does not visually hide the edge of steps.  Be aware of all pets. OTHER HOME SAFETY TIPS  Set the water temperature for 120 F (48.8 C).  Keep emergency numbers on or near the telephone.  Keep smoke detectors on every level of the home and near sleeping areas. Document Released: 12/30/2001 Document Revised: 07/11/2011 Document Reviewed: 03/31/2011 [facility] Patient Information 2015 [place], [place]. This information is not intended to replace advice given to you by your health care provider. Make sure you discuss any questions you have with your health care provider.                 Exercise for Strong Bones  Exercise is important to build and maintain strong bones / bone density.  There are 2 types of exercises that are important to  building and maintaining strong bones:  Weight- bearing and muscle-stregthening.  [employer] is a good program for both strength and balance training  Weight-bearing Exercises  These exercises include activities that make you move against gravity while staying upright. Weight-bearing exercises can be high-impact or low-impact.  High-impact weight-bearing exercises help build bones and keep them strong. If you have broken a bone due to osteoporosis or are at risk of breaking a bone, you may need to avoid high-impact exercises. If you're not sure, you should check with your healthcare provider.  Examples of high-impact weight-bearing exercises are: Dancing  Doing high-impact aerobics  Hiking  Jogging/running  Jumping Rope  Stair climbing  Tennis  Low-impact weight-bearing exercises can also help keep bones strong and are a safe alternative if you cannot do high-impact exercises.  Examples of low-impact weight-bearing exercises are: Using elliptical training machines  Doing low-impact aerobics  Using stair-step machines  Fast walking on a treadmill or outside   Muscle-Strengthening Exercises These exercises include activities where you move your body, a weight or some other resistance against gravity. They are also known as resistance exercises and include: Lifting weights  Using elastic exercise bands  Using weight machines  Lifting your own body weight  Functional movements, such as standing and rising up on your toes  Yoga and Pilates can also improve strength, balance and flexibility. However, certain positions may not be safe for people with osteoporosis or those at increased risk of broken bones. For example, exercises that have you bend forward may increase the chance of breaking a bone in the spine.   Non-Impact Exercises There are other types of exercises that can help prevent falls.  Non-impact exercises can help you to improve balance, posture and how well you  move in everyday activities. Some of these exercises include: Balance exercises that strengthen your legs and test your balance, such as Tai Chi, can decrease your risk of falls.  Posture exercises that improve your posture and reduce rounded or "sloping" shoulders can help you decrease the chance of breaking a bone, especially in the spine.  Functional exercises that improve how well you move can help you with everyday activities and decrease your chance of falling and breaking a bone. For example, if you have trouble getting up from a chair or climbing stairs, you should do these activities as exercises.   **A physical therapist can teach you balance, posture and functional exercises. He/she can also help you learn which exercises are safe and appropriate for you.  [facility] has a physical therapy office in [place] in front of our office and referrals can be made for assessments and treatment as needed and strength and balance training.  If you would like to have an assessment with [facility] and our physical therapy team please let a nurse or provider know.

## 2014-07-31 ENCOUNTER — Encounter: Payer: Self-pay | Admitting: Nurse Practitioner

## 2014-07-31 ENCOUNTER — Ambulatory Visit (INDEPENDENT_AMBULATORY_CARE_PROVIDER_SITE_OTHER): Payer: Commercial Managed Care - HMO | Admitting: Nurse Practitioner

## 2014-07-31 VITALS — BP 139/84 | HR 60 | Temp 97.4°F | Ht 64.0 in | Wt 164.0 lb

## 2014-07-31 DIAGNOSIS — Z6828 Body mass index (BMI) 28.0-28.9, adult: Secondary | ICD-10-CM

## 2014-07-31 DIAGNOSIS — F411 Generalized anxiety disorder: Secondary | ICD-10-CM | POA: Diagnosis not present

## 2014-07-31 DIAGNOSIS — M8580 Other specified disorders of bone density and structure, unspecified site: Secondary | ICD-10-CM

## 2014-07-31 DIAGNOSIS — I10 Essential (primary) hypertension: Secondary | ICD-10-CM

## 2014-07-31 DIAGNOSIS — J449 Chronic obstructive pulmonary disease, unspecified: Secondary | ICD-10-CM | POA: Diagnosis not present

## 2014-07-31 DIAGNOSIS — F410 Panic disorder [episodic paroxysmal anxiety] without agoraphobia: Secondary | ICD-10-CM | POA: Diagnosis not present

## 2014-07-31 DIAGNOSIS — Z6829 Body mass index (BMI) 29.0-29.9, adult: Secondary | ICD-10-CM | POA: Insufficient documentation

## 2014-07-31 DIAGNOSIS — E785 Hyperlipidemia, unspecified: Secondary | ICD-10-CM

## 2014-07-31 DIAGNOSIS — K219 Gastro-esophageal reflux disease without esophagitis: Secondary | ICD-10-CM | POA: Diagnosis not present

## 2014-07-31 MED ORDER — ALPRAZOLAM 0.5 MG PO TABS
0.5000 mg | ORAL_TABLET | Freq: Every day | ORAL | Status: DC
Start: 2014-07-31 — End: 2014-11-05

## 2014-07-31 MED ORDER — LISINOPRIL-HYDROCHLOROTHIAZIDE 10-12.5 MG PO TABS: 1.0000 | ORAL_TABLET | Freq: Every day | ORAL | Status: DC

## 2014-07-31 MED ORDER — FLUTICASONE-SALMETEROL 100-50 MCG/DOSE IN AEPB: 1.0000 | INHALATION_SPRAY | Freq: Two times a day (BID) | RESPIRATORY_TRACT | Status: DC

## 2014-07-31 NOTE — Progress Notes (Signed)
Subjective:    Patient ID: Gabriela Wilson, female    DOB: [birth year], 69 y.o.   MRN: [id number]   Patient here today for follow up of chronic medical problems.   Hyperlipidemia This is a chronic problem. The current episode started more than 1 year ago. The problem is controlled. Recent lipid tests were reviewed and are normal. She has no history of diabetes, hypothyroidism or obesity. Current antihyperlipidemic treatment includes statins. The current treatment provides moderate improvement of lipids. Compliance problems include adherence to diet and adherence to exercise.  Risk factors for coronary artery disease include dyslipidemia, hypertension and post-menopausal.  Hypertension This is a chronic problem. The current episode started more than 1 year ago. The problem is unchanged. Pertinent negatives include no headaches. Risk factors for coronary artery disease include dyslipidemia and post-menopausal state. Past treatments include ACE inhibitors and diuretics. The current treatment provides moderate improvement. There is no history of CAD/MI or CVA.  GERD Omeprazole daily keeps symptoms under control. COPD Currently on advair BID- has not needed albuterol over the last several months GAD Xanax BID- keeps her calm - gets very anxious if does not take.       Review of Systems  Constitutional: Negative.   HENT: Negative.   Respiratory: Negative.   Cardiovascular: Negative.   Genitourinary: Negative.   Neurological: Negative for headaches.  Psychiatric/Behavioral: Negative.   All other systems reviewed and are negative.      Objective:   Physical Exam  Constitutional: She is oriented to person, place, and time. She appears well-developed and well-nourished.  HENT:  Nose: Nose normal.  Mouth/Throat: Oropharynx is clear and moist.  Eyes: EOM are normal.  Neck: Trachea normal, normal range of motion and full passive range of motion without pain. Neck supple. No JVD present.  Carotid bruit is not present. No thyromegaly present.  Cardiovascular: Normal rate, regular rhythm, normal heart sounds and intact distal pulses.  Exam reveals no gallop and no friction rub.   No murmur heard. Pulmonary/Chest: Effort normal and breath sounds normal.  Abdominal: Soft. Bowel sounds are normal. She exhibits no distension and no mass. There is no tenderness.  Musculoskeletal: Normal range of motion.  Lymphadenopathy:    She has no cervical adenopathy.  Neurological: She is alert and oriented to person, place, and time. She has normal reflexes.  Skin: Skin is warm and dry.  Psychiatric: She has a normal mood and affect. Her behavior is normal. Judgment and thought content normal.   BP 139/84 mmHg  Pulse 60  Temp(Src) 97.4 F (36.3 C) (Oral)  Ht _0  (1.626 m)  Wt 164 lb (74.39 kg)  BMI 28.14 kg/m2         Assessment & Plan:  1. Essential hypertension, benign Do not add salt to diet - lisinopril-hydrochlorothiazide (PRINZIDE,ZESTORETIC) 10-12.5 MG per tablet; Take 1 tablet by mouth daily.  Dispense: 90 tablet; Refill: 1 - CMP14+EGFR  2. Gastroesophageal reflux disease, esophagitis presence not specified Avoid spicy foods Do not eat 2 hours prior to bedtime   3. Osteopenia Weight bearing exercises  4. PANIC DISORDER Stress management  5. Hyperlipidemia with target LDL less than 100 Low fta diet - Lipid panel  6. GAD (generalized anxiety disorder) - ALPRAZolam (XANAX) 0.5 MG tablet; Take 1 tablet (0.5 mg total) by mouth daily.  Dispense: 30 tablet; Refill: 1  7. Chronic obstructive pulmonary disease, unspecified COPD, unspecified chronic bronchitis type - Fluticasone-Salmeterol (ADVAIR DISKUS) 100-50 MCG/DOSE AEPB; Inhale 1 puff into  the lungs 2 (two) times daily.  Dispense: 180 each; Refill: 1   Refuses adult immunizations Labs pending Health maintenance reviewed Diet and exercise encouraged Continue all meds Follow up  In 3 month   [place], FNP

## 2014-07-31 NOTE — Patient Instructions (Signed)

## 2014-08-01 LAB — CMP14+EGFR
ALT: 34 IU/L — ABNORMAL HIGH (ref 0–32)
AST: 31 IU/L (ref 0–40)
Albumin/Globulin Ratio: 2.1 (ref 1.1–2.5)
Albumin: 4.5 g/dL (ref 3.6–4.8)
Alkaline Phosphatase: 49 IU/L (ref 39–117)
BUN/Creatinine Ratio: 14 (ref 11–26)
BUN: 14 mg/dL (ref 8–27)
Bilirubin Total: 0.7 mg/dL (ref 0.0–1.2)
CO2: 24 mmol/L (ref 18–29)
Calcium: 9.7 mg/dL (ref 8.7–10.3)
Chloride: 100 mmol/L (ref 97–108)
Creatinine, Ser: 0.99 mg/dL (ref 0.57–1.00)
GFR calc Af Amer: 68 mL/min/1.73
GFR calc non Af Amer: 59 mL/min/1.73 — ABNORMAL LOW
Globulin, Total: 2.1 g/dL (ref 1.5–4.5)
Glucose: 103 mg/dL — ABNORMAL HIGH (ref 65–99)
Potassium: 4.9 mmol/L (ref 3.5–5.2)
Sodium: 139 mmol/L (ref 134–144)
Total Protein: 6.6 g/dL (ref 6.0–8.5)

## 2014-08-01 LAB — LIPID PANEL
Chol/HDL Ratio: 2.9 ratio (ref 0.0–4.4)
Cholesterol, Total: 168 mg/dL (ref 100–199)
HDL: 58 mg/dL
LDL Calculated: 89 mg/dL (ref 0–99)
Triglycerides: 105 mg/dL (ref 0–149)
VLDL Cholesterol Cal: 21 mg/dL (ref 5–40)

## 2014-08-04 ENCOUNTER — Encounter: Payer: Self-pay | Admitting: *Deleted

## 2014-11-05 ENCOUNTER — Ambulatory Visit (INDEPENDENT_AMBULATORY_CARE_PROVIDER_SITE_OTHER): Payer: Commercial Managed Care - HMO | Admitting: Nurse Practitioner

## 2014-11-05 ENCOUNTER — Encounter: Payer: Self-pay | Admitting: Nurse Practitioner

## 2014-11-05 VITALS — BP 152/67 | HR 62 | Temp 97.4°F | Ht 64.0 in | Wt 170.0 lb

## 2014-11-05 DIAGNOSIS — J069 Acute upper respiratory infection, unspecified: Secondary | ICD-10-CM

## 2014-11-05 DIAGNOSIS — H6192 Disorder of left external ear, unspecified: Secondary | ICD-10-CM | POA: Diagnosis not present

## 2014-11-05 DIAGNOSIS — I10 Essential (primary) hypertension: Secondary | ICD-10-CM | POA: Diagnosis not present

## 2014-11-05 DIAGNOSIS — F411 Generalized anxiety disorder: Secondary | ICD-10-CM | POA: Diagnosis not present

## 2014-11-05 DIAGNOSIS — Z6829 Body mass index (BMI) 29.0-29.9, adult: Secondary | ICD-10-CM

## 2014-11-05 DIAGNOSIS — E785 Hyperlipidemia, unspecified: Secondary | ICD-10-CM | POA: Diagnosis not present

## 2014-11-05 DIAGNOSIS — K219 Gastro-esophageal reflux disease without esophagitis: Secondary | ICD-10-CM

## 2014-11-05 DIAGNOSIS — F410 Panic disorder [episodic paroxysmal anxiety] without agoraphobia: Secondary | ICD-10-CM

## 2014-11-05 DIAGNOSIS — J438 Other emphysema: Secondary | ICD-10-CM

## 2014-11-05 MED ORDER — ALPRAZOLAM 0.5 MG PO TABS: 0.5000 mg | ORAL_TABLET | Freq: Every day | ORAL | Status: DC

## 2014-11-05 MED ORDER — OMEPRAZOLE 20 MG PO CPDR: 20.0000 mg | DELAYED_RELEASE_CAPSULE | Freq: Every day | ORAL | Status: DC

## 2014-11-05 NOTE — Progress Notes (Addendum)
Subjective:    Patient ID: Gabriela Wilson, female    DOB: November 17, 1945, 69 y.o.   MRN: [id number]   Patient here today for follow up of chronic medical problems. * Patient has had some sinus issue the last several days and has been taking an OTC decongestant.   Hyperlipidemia This is a chronic problem. The current episode started more than 1 year ago. The problem is controlled. Recent lipid tests were reviewed and are normal. She has no history of diabetes, hypothyroidism or obesity. Current antihyperlipidemic treatment includes statins. The current treatment provides moderate improvement of lipids. Compliance problems include adherence to diet and adherence to exercise.  Risk factors for coronary artery disease include dyslipidemia, hypertension and post-menopausal.  Hypertension This is a chronic problem. The current episode started more than 1 year ago. The problem is unchanged. Pertinent negatives include no headaches. Risk factors for coronary artery disease include dyslipidemia and post-menopausal state. Past treatments include ACE inhibitors and diuretics. The current treatment provides moderate improvement. There is no history of CAD/MI or CVA.  GERD Omeprazole daily keeps symptoms under control. 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       Review of Systems  Constitutional: Negative.   HENT: Positive for congestion, postnasal drip and rhinorrhea. Negative for ear pain, sinus pressure and sore throat.   Respiratory: Positive for cough (slight nonproductive).   Cardiovascular: Negative.   Genitourinary: Negative.   Neurological: Negative for headaches.  Psychiatric/Behavioral: Negative.   All other systems reviewed and are negative.      Objective:   Physical Exam  Constitutional: She is oriented to person, place, and time. She appears  well-developed and well-nourished.  HENT:  Nose: Nose normal.  Mouth/Throat: Oropharynx is clear and moist.  Eyes: EOM are normal.  Neck: Trachea normal, normal range of motion and full passive range of motion without pain. Neck supple. No JVD present. Carotid bruit is not present. No thyromegaly present.  Cardiovascular: Normal rate, regular rhythm, normal heart sounds and intact distal pulses.  Exam reveals no gallop and no friction rub.   No murmur heard. Pulmonary/Chest: Effort normal and breath sounds normal.  Abdominal: Soft. Bowel sounds are normal. She exhibits no distension and no mass. There is no tenderness.  Musculoskeletal: Normal range of motion.  Lymphadenopathy:    She has no cervical adenopathy.  Neurological: She is alert and oriented to person, place, and time. She has normal reflexes.  Skin: Skin is warm and dry.  Whitish scaley lesion left auricle  Psychiatric: She has a normal mood and affect. Her behavior is normal. Judgment and thought content normal.    BP 152/67 mmHg  Pulse 62  Temp(Src) 97.4 F (36.3 C) (Oral)  Ht _0  (1.626 m)  Wt 170 lb (77.111 kg)  BMI 29.17 kg/m2          Assessment & Plan:  1. BMI 29.0-29.9,adult Discussed diet and exercise for person with BMI >25 Will recheck weight in 3-6 months   2. Hyperlipidemia with target LDL less than 100 Low fat diet  3. PANIC DISORDER Stress management  4. GAD (generalized anxiety disorder) Stress management  5. Gastroesophageal reflux disease, esophagitis presence not specified Avoid spicy foods Do not eat 2 hours prior to bedtime   6. EMPHYSEMA Avoid cigarette smoke and allergens  7. Essential hypertension, benign Do not add salt  to diet  8. Lesion of external ear, left Do not pick or scratch at area - Ambulatory referral to Dermatology  9. Viral URI 1. Take meds as prescribed 2. Use a cool mist humidifier especially during the winter months and when heat has been  humid. 3. Use saline nose sprays frequently 4. Saline irrigations of the nose can be very helpful if done frequently.  * 4X daily for 1 week*  * Use of a nettie pot can be helpful with this. Follow directions with this* 5. Drink plenty of fluids 6. Keep thermostat turn down low 7.For any cough or congestion  Use plain Mucinex- regular strength or max strength is fine- no decongestants   * Children- consult with Pharmacist for dosing 8. For fever or aces or pains- take tylenol or ibuprofen appropriate for age and weight.  * for fevers greater than 101 orally you may alternate ibuprofen and tylenol every  3 hours.    Meds ordered this encounter  Medications  . omeprazole (PRILOSEC) 20 MG capsule    Sig: Take 1 capsule (20 mg total) by mouth daily.    Dispense:  90 capsule    Refill:  1    Order Specific Question:  Supervising Provider    Answer:  Chipper Herb [[id number]]  . ALPRAZolam (XANAX) 0.5 MG tablet    Sig: Take 1 tablet (0.5 mg total) by mouth daily.    Dispense:  30 tablet    Refill:  1    Order Specific Question:  Supervising Provider    Answer:  Chipper Herb [[id number]]     Orders Placed This Encounter  Procedures  . CMP14+EGFR  . Lipid panel  . Ambulatory referral to Dermatology    Referral Priority:  Routine    Referral Type:  Consultation    Referral Reason:  Specialty Services Required    Referred to Provider:  Lavonna Monarch, MD    Requested Specialty:  Dermatology    Number of Visits Requested:  1     Labs pending Health maintenance reviewed Diet and exercise encouraged Continue all meds Follow up  In 3 months   [place], FNP

## 2014-11-05 NOTE — Patient Instructions (Signed)
1. Take meds as prescribed 2. Use a cool mist humidifier especially during the winter months and when heat has been humid. 3. Use saline nose sprays frequently 4. Saline irrigations of the nose can be very helpful if done frequently.  * 4X daily for 1 week*  * Use of a nettie pot can be helpful with this. Follow directions with this* 5. Drink plenty of fluids 6. Keep thermostat turn down low 7.For any cough or congestion  Use plain Mucinex- regular strength or max strength is fine- avoid decongestanta   * Children- consult with Pharmacist for dosing 8. For fever or aces or pains- take tylenol or ibuprofen appropriate for age and weight.  * for fevers greater than 101 orally you may alternate ibuprofen and tylenol every  3 hours.

## 2014-11-05 NOTE — Addendum Note (Signed)
Addended by: Chevis Pretty on: 11/05/2014 10:19 AM   Modules accepted: Orders

## 2014-11-05 NOTE — Addendum Note (Signed)
Addended by: Chevis Pretty on: 11/05/2014 10:21 AM   Modules accepted: Orders

## 2014-11-06 LAB — CMP14+EGFR
A/G RATIO: 2.3 (ref 1.1–2.5)
ALT: 17 IU/L (ref 0–32)
AST: 23 IU/L (ref 0–40)
Albumin: 4.6 g/dL (ref 3.6–4.8)
Alkaline Phosphatase: 53 IU/L (ref 39–117)
BUN/Creatinine Ratio: 18 (ref 11–26)
BUN: 17 mg/dL (ref 8–27)
Bilirubin Total: 0.6 mg/dL (ref 0.0–1.2)
CO2: 26 mmol/L (ref 18–29)
Calcium: 10.2 mg/dL (ref 8.7–10.3)
Chloride: 102 mmol/L (ref 97–108)
Creatinine, Ser: 0.92 mg/dL (ref 0.57–1.00)
GFR calc non Af Amer: 64 mL/min/{1.73_m2} (ref >59)
GFR, EST AFRICAN AMERICAN: 74 mL/min/{1.73_m2} (ref >59)
GLOBULIN, TOTAL: 2 g/dL (ref 1.5–4.5)
Glucose: 110 mg/dL — ABNORMAL HIGH (ref 65–99)
POTASSIUM: 5.4 mmol/L — AB (ref 3.5–5.2)
SODIUM: 144 mmol/L (ref 134–144)
TOTAL PROTEIN: 6.6 g/dL (ref 6.0–8.5)

## 2014-11-06 LAB — LIPID PANEL
CHOL/HDL RATIO: 4.3 ratio (ref 0.0–4.4)
Cholesterol, Total: 204 mg/dL — ABNORMAL HIGH (ref 100–199)
HDL: 47 mg/dL (ref >39)
LDL Calculated: 127 mg/dL — ABNORMAL HIGH (ref 0–99)
TRIGLYCERIDES: 148 mg/dL (ref 0–149)
VLDL Cholesterol Cal: 30 mg/dL (ref 5–40)

## 2014-11-10 ENCOUNTER — Ambulatory Visit: Payer: Commercial Managed Care - HMO | Admitting: Nurse Practitioner

## 2014-11-24 ENCOUNTER — Ambulatory Visit (INDEPENDENT_AMBULATORY_CARE_PROVIDER_SITE_OTHER): Payer: Commercial Managed Care - HMO

## 2014-11-24 DIAGNOSIS — Z23 Encounter for immunization: Secondary | ICD-10-CM

## 2014-12-08 ENCOUNTER — Other Ambulatory Visit: Payer: Self-pay | Admitting: Dermatology

## 2014-12-09 ENCOUNTER — Ambulatory Visit (INDEPENDENT_AMBULATORY_CARE_PROVIDER_SITE_OTHER): Payer: Commercial Managed Care - HMO | Admitting: *Deleted

## 2014-12-09 VITALS — BP 111/65 | HR 76

## 2014-12-09 DIAGNOSIS — I10 Essential (primary) hypertension: Secondary | ICD-10-CM

## 2014-12-09 NOTE — Patient Instructions (Signed)
Hypertension Hypertension, commonly called high blood pressure, is when the force of blood pumping through your arteries is too strong. Your arteries are the blood vessels that carry blood from your heart throughout your body. A blood pressure reading consists of a higher number over a lower number, such as 110/72. The higher number (systolic) is the pressure inside your arteries when your heart pumps. The lower number (diastolic) is the pressure inside your arteries when your heart relaxes. Ideally you want your blood pressure below 120/80. Hypertension forces your heart to work harder to pump blood. Your arteries may become narrow or stiff. Having untreated or uncontrolled hypertension can cause heart attack, stroke, kidney disease, and other problems. RISK FACTORS Some risk factors for high blood pressure are controllable. Others are not.  Risk factors you cannot control include:   Race. You may be at higher risk if you are African American.  Age. Risk increases with age.  Gender. Men are at higher risk than women before age 45 years. After age 65, women are at higher risk than men. Risk factors you can control include:  Not getting enough exercise or physical activity.  Being overweight.  Getting too much fat, sugar, calories, or salt in your diet.  Drinking too much alcohol. SIGNS AND SYMPTOMS Hypertension does not usually cause signs or symptoms. Extremely high blood pressure (hypertensive crisis) may cause headache, anxiety, shortness of breath, and nosebleed. DIAGNOSIS To check if you have hypertension, your health care provider will measure your blood pressure while you are seated, with your arm held at the level of your heart. It should be measured at least twice using the same arm. Certain conditions can cause a difference in blood pressure between your right and left arms. A blood pressure reading that is higher than normal on one occasion does not mean that you need treatment. If  it is not clear whether you have high blood pressure, you may be asked to return on a different day to have your blood pressure checked again. Or, you may be asked to monitor your blood pressure at home for 1 or more weeks. TREATMENT Treating high blood pressure includes making lifestyle changes and possibly taking medicine. Living a healthy lifestyle can help lower high blood pressure. You may need to change some of your habits. Lifestyle changes may include:  Following the DASH diet. This diet is high in fruits, vegetables, and whole grains. It is low in salt, red meat, and added sugars.  Keep your sodium intake below 2,300 mg per day.  Getting at least 30-45 minutes of aerobic exercise at least 4 times per week.  Losing weight if necessary.  Not smoking.  Limiting alcoholic beverages.  Learning ways to reduce stress. Your health care provider may prescribe medicine if lifestyle changes are not enough to get your blood pressure under control, and if one of the following is true:  You are 18-59 years of age and your systolic blood pressure is above 140.  You are 60 years of age or older, and your systolic blood pressure is above 150.  Your diastolic blood pressure is above 90.  You have diabetes, and your systolic blood pressure is over 140 or your diastolic blood pressure is over 90.  You have kidney disease and your blood pressure is above 140/90.  You have heart disease and your blood pressure is above 140/90. Your personal target blood pressure may vary depending on your medical conditions, your age, and other factors. HOME CARE INSTRUCTIONS    Have your blood pressure rechecked as directed by your health care provider.   Take medicines only as directed by your health care provider. Follow the directions carefully. Blood pressure medicines must be taken as prescribed. The medicine does not work as well when you skip doses. Skipping doses also puts you at risk for  problems.  Do not smoke.   Monitor your blood pressure at home as directed by your health care provider. SEEK MEDICAL CARE IF:   You think you are having a reaction to medicines taken.  You have recurrent headaches or feel dizzy.  You have swelling in your ankles.  You have trouble with your vision. SEEK IMMEDIATE MEDICAL CARE IF:  You develop a severe headache or confusion.  You have unusual weakness, numbness, or feel faint.  You have severe chest or abdominal pain.  You vomit repeatedly.  You have trouble breathing. MAKE SURE YOU:   Understand these instructions.  Will watch your condition.  Will get help right away if you are not doing well or get worse.   This information is not intended to replace advice given to you by your health care provider. Make sure you discuss any questions you have with your health care provider.   Document Released: 01/09/2005 Document Revised: 05/26/2014 Document Reviewed: 11/01/2012 Elsevier Interactive Patient Education 2016 [employer]  

## 2014-12-09 NOTE — Progress Notes (Signed)
Patient is here today for a bp check. Patients BP 111/65 mmHg  Pulse 76

## 2015-02-05 ENCOUNTER — Encounter: Payer: Self-pay | Admitting: Nurse Practitioner

## 2015-02-05 ENCOUNTER — Ambulatory Visit (INDEPENDENT_AMBULATORY_CARE_PROVIDER_SITE_OTHER): Payer: PPO | Admitting: Nurse Practitioner

## 2015-02-05 VITALS — BP 144/84 | HR 65 | Temp 97.6°F | Ht 64.0 in | Wt 171.4 lb

## 2015-02-05 DIAGNOSIS — F410 Panic disorder [episodic paroxysmal anxiety] without agoraphobia: Secondary | ICD-10-CM

## 2015-02-05 DIAGNOSIS — J449 Chronic obstructive pulmonary disease, unspecified: Secondary | ICD-10-CM | POA: Diagnosis not present

## 2015-02-05 DIAGNOSIS — Z1212 Encounter for screening for malignant neoplasm of rectum: Secondary | ICD-10-CM | POA: Diagnosis not present

## 2015-02-05 DIAGNOSIS — Z1159 Encounter for screening for other viral diseases: Secondary | ICD-10-CM | POA: Diagnosis not present

## 2015-02-05 DIAGNOSIS — K219 Gastro-esophageal reflux disease without esophagitis: Secondary | ICD-10-CM | POA: Diagnosis not present

## 2015-02-05 DIAGNOSIS — F411 Generalized anxiety disorder: Secondary | ICD-10-CM | POA: Diagnosis not present

## 2015-02-05 DIAGNOSIS — Z6829 Body mass index (BMI) 29.0-29.9, adult: Secondary | ICD-10-CM

## 2015-02-05 DIAGNOSIS — E785 Hyperlipidemia, unspecified: Secondary | ICD-10-CM

## 2015-02-05 DIAGNOSIS — I10 Essential (primary) hypertension: Secondary | ICD-10-CM

## 2015-02-05 DIAGNOSIS — IMO0001 Reserved for inherently not codable concepts without codable children: Secondary | ICD-10-CM

## 2015-02-05 MED ORDER — ALPRAZOLAM 0.5 MG PO TABS: 0.5000 mg | ORAL_TABLET | Freq: Every day | ORAL | Status: DC

## 2015-02-05 MED ORDER — FLUTICASONE-SALMETEROL 100-50 MCG/DOSE IN AEPB: 1.0000 | INHALATION_SPRAY | Freq: Two times a day (BID) | RESPIRATORY_TRACT | Status: DC

## 2015-02-05 MED ORDER — OMEPRAZOLE 20 MG PO CPDR: 20.0000 mg | DELAYED_RELEASE_CAPSULE | Freq: Every day | ORAL | Status: DC

## 2015-02-05 MED ORDER — ALBUTEROL SULFATE HFA 108 (90 BASE) MCG/ACT IN AERS: 2.0000 | INHALATION_SPRAY | Freq: Four times a day (QID) | RESPIRATORY_TRACT | Status: DC | PRN

## 2015-02-05 MED ORDER — LISINOPRIL-HYDROCHLOROTHIAZIDE 10-12.5 MG PO TABS: 1.0000 | ORAL_TABLET | Freq: Every day | ORAL | Status: DC

## 2015-02-05 NOTE — Patient Instructions (Signed)
Health Maintenance, Female Adopting a healthy lifestyle and getting preventive care can go a long way to promote health and wellness. Talk with your health care provider about what schedule of regular examinations is right for you. This is a good chance for you to check in with your provider about disease prevention and staying healthy. In between checkups, there are plenty of things you can do on your own. Experts have done a lot of research about which lifestyle changes and preventive measures are most likely to keep you healthy. Ask your health care provider for more information. WEIGHT AND DIET  Eat a healthy diet  Be sure to include plenty of vegetables, fruits, low-fat dairy products, and lean protein.  Do not eat a lot of foods high in solid fats, added sugars, or salt.  Get regular exercise. This is one of the most important things you can do for your health.  Most adults should exercise for at least 150 minutes each week. The exercise should increase your heart rate and make you sweat (moderate-intensity exercise).  Most adults should also do strengthening exercises at least twice a week. This is in addition to the moderate-intensity exercise.  Maintain a healthy weight  Body mass index (BMI) is a measurement that can be used to identify possible weight problems. It estimates body fat based on height and weight. Your health care provider can help determine your BMI and help you achieve or maintain a healthy weight.  For females 20 years of age and older:   A BMI below 18.5 is considered underweight.  A BMI of 18.5 to 24.9 is normal.  A BMI of 25 to 29.9 is considered overweight.  A BMI of 30 and above is considered obese.  Watch levels of cholesterol and blood lipids  You should start having your blood tested for lipids and cholesterol at 69 years of age, then have this test every 5 years.  You may need to have your cholesterol levels checked more often if:  Your lipid  or cholesterol levels are high.  You are older than 69 years of age.  You are at high risk for heart disease.  CANCER SCREENING   Lung Cancer  Lung cancer screening is recommended for adults 55-80 years old who are at high risk for lung cancer because of a history of smoking.  A yearly low-dose CT scan of the lungs is recommended for people who:  Currently smoke.  Have quit within the past 15 years.  Have at least a 30-pack-year history of smoking. A pack year is smoking an average of one pack of cigarettes a day for 1 year.  Yearly screening should continue until it has been 15 years since you quit.  Yearly screening should stop if you develop a health problem that would prevent you from having lung cancer treatment.  Breast Cancer  Practice breast self-awareness. This means understanding how your breasts normally appear and feel.  It also means doing regular breast self-exams. Let your health care provider know about any changes, no matter how small.  If you are in your 20s or 30s, you should have a clinical breast exam (CBE) by a health care provider every 1-3 years as part of a regular health exam.  If you are 40 or older, have a CBE every year. Also consider having a breast X-ray (mammogram) every year.  If you have a family history of breast cancer, talk to your health care provider about genetic screening.  If you   are at high risk for breast cancer, talk to your health care provider about having an MRI and a mammogram every year.  Breast cancer gene (BRCA) assessment is recommended for women who have family members with BRCA-related cancers. BRCA-related cancers include:  Breast.  Ovarian.  Tubal.  Peritoneal cancers.  Results of the assessment will determine the need for genetic counseling and BRCA1 and BRCA2 testing. Cervical Cancer Your health care provider may recommend that you be screened regularly for cancer of the pelvic organs (ovaries, uterus, and  vagina). This screening involves a pelvic examination, including checking for microscopic changes to the surface of your cervix (Pap test). You may be encouraged to have this screening done every 3 years, beginning at age 21.  For women ages 30-65, health care providers may recommend pelvic exams and Pap testing every 3 years, or they may recommend the Pap and pelvic exam, combined with testing for human papilloma virus (HPV), every 5 years. Some types of HPV increase your risk of cervical cancer. Testing for HPV may also be done on women of any age with unclear Pap test results.  Other health care providers may not recommend any screening for nonpregnant women who are considered low risk for pelvic cancer and who do not have symptoms. Ask your health care provider if a screening pelvic exam is right for you.  If you have had past treatment for cervical cancer or a condition that could lead to cancer, you need Pap tests and screening for cancer for at least 20 years after your treatment. If Pap tests have been discontinued, your risk factors (such as having a new sexual partner) need to be reassessed to determine if screening should resume. Some women have medical problems that increase the chance of getting cervical cancer. In these cases, your health care provider may recommend more frequent screening and Pap tests. Colorectal Cancer  This type of cancer can be detected and often prevented.  Routine colorectal cancer screening usually begins at 69 years of age and continues through 69 years of age.  Your health care provider may recommend screening at an earlier age if you have risk factors for colon cancer.  Your health care provider may also recommend using home test kits to check for hidden blood in the stool.  A small camera at the end of a tube can be used to examine your colon directly (sigmoidoscopy or colonoscopy). This is done to check for the earliest forms of colorectal  cancer.  Routine screening usually begins at age 50.  Direct examination of the colon should be repeated every 5-10 years through 69 years of age. However, you may need to be screened more often if early forms of precancerous polyps or small growths are found. Skin Cancer  Check your skin from head to toe regularly.  Tell your health care provider about any new moles or changes in moles, especially if there is a change in a mole's shape or color.  Also tell your health care provider if you have a mole that is larger than the size of a pencil eraser.  Always use sunscreen. Apply sunscreen liberally and repeatedly throughout the day.  Protect yourself by wearing long sleeves, pants, a wide-brimmed hat, and sunglasses whenever you are outside. HEART DISEASE, DIABETES, AND HIGH BLOOD PRESSURE   High blood pressure causes heart disease and increases the risk of stroke. High blood pressure is more likely to develop in:  People who have blood pressure in the high end   of the normal range (130-139/85-89 mm Hg).  People who are overweight or obese.  People who are African American.  If you are 38-23 years of age, have your blood pressure checked every 3-5 years. If you are 61 years of age or older, have your blood pressure checked every year. You should have your blood pressure measured twice--once when you are at a hospital or clinic, and once when you are not at a hospital or clinic. Record the average of the two measurements. To check your blood pressure when you are not at a hospital or clinic, you can use:  An automated blood pressure machine at a pharmacy.  A home blood pressure monitor.  If you are between 45 years and 39 years old, ask your health care provider if you should take aspirin to prevent strokes.  Have regular diabetes screenings. This involves taking a blood sample to check your fasting blood sugar level.  If you are at a normal weight and have a low risk for diabetes,  have this test once every three years after 68 years of age.  If you are overweight and have a high risk for diabetes, consider being tested at a younger age or more often. PREVENTING INFECTION  Hepatitis B  If you have a higher risk for hepatitis B, you should be screened for this virus. You are considered at high risk for hepatitis B if:  You were born in a country where hepatitis B is common. Ask your health care provider which countries are considered high risk.  Your parents were born in a high-risk country, and you have not been immunized against hepatitis B (hepatitis B vaccine).  You have HIV or AIDS.  You use needles to inject street drugs.  You live with someone who has hepatitis B.  You have had sex with someone who has hepatitis B.  You get hemodialysis treatment.  You take certain medicines for conditions, including cancer, organ transplantation, and autoimmune conditions. Hepatitis C  Blood testing is recommended for:  Everyone born from [birth year] through 1964.  Anyone with known risk factors for hepatitis C. Sexually transmitted infections (STIs)  You should be screened for sexually transmitted infections (STIs) including gonorrhea and chlamydia if:  You are sexually active and are younger than 69 years of age.  You are older than 69 years of age and your health care provider tells you that you are at risk for this type of infection.  Your sexual activity has changed since you were last screened and you are at an increased risk for chlamydia or gonorrhea. Ask your health care provider if you are at risk.  If you do not have HIV, but are at risk, it may be recommended that you take a prescription medicine daily to prevent HIV infection. This is called pre-exposure prophylaxis (PrEP). You are considered at risk if:  You are sexually active and do not regularly use condoms or know the HIV status of your partner(s).  You take drugs by injection.  You are sexually  active with a partner who has HIV. Talk with your health care provider about whether you are at high risk of being infected with HIV. If you choose to begin PrEP, you should first be tested for HIV. You should then be tested every 3 months for as long as you are taking PrEP.  PREGNANCY   If you are premenopausal and you may become pregnant, ask your health care provider about preconception counseling.  If you may  become pregnant, take 400 to 800 micrograms (mcg) of folic acid every day.  If you want to prevent pregnancy, talk to your health care provider about birth control (contraception). OSTEOPOROSIS AND MENOPAUSE   Osteoporosis is a disease in which the bones lose minerals and strength with aging. This can result in serious bone fractures. Your risk for osteoporosis can be identified using a bone density scan.  If you are 61 years of age or older, or if you are at risk for osteoporosis and fractures, ask your health care provider if you should be screened.  Ask your health care provider whether you should take a calcium or vitamin D supplement to lower your risk for osteoporosis.  Menopause may have certain physical symptoms and risks.  Hormone replacement therapy may reduce some of these symptoms and risks. Talk to your health care provider about whether hormone replacement therapy is right for you.  HOME CARE INSTRUCTIONS   Schedule regular health, dental, and eye exams.  Stay current with your immunizations.   Do not use any tobacco products including cigarettes, chewing tobacco, or electronic cigarettes.  If you are pregnant, do not drink alcohol.  If you are breastfeeding, limit how much and how often you drink alcohol.  Limit alcohol intake to no more than 1 drink per day for nonpregnant women. One drink equals 12 ounces of beer, 5 ounces of wine, or 1 ounces of hard liquor.  Do not use street drugs.  Do not share needles.  Ask your health care provider for help if  you need support or information about quitting drugs.  Tell your health care provider if you often feel depressed.  Tell your health care provider if you have ever been abused or do not feel safe at home.   This information is not intended to replace advice given to you by your health care provider. Make sure you discuss any questions you have with your health care provider.   Document Released: 07/25/2010 Document Revised: 01/30/2014 Document Reviewed: 12/11/2012 Elsevier Interactive Patient Education [employer].

## 2015-02-05 NOTE — Progress Notes (Signed)
Subjective:    Patient ID: Gabriela Wilson, female    DOB: [birth year], 71 y.o.   MRN: [id number]   Patient here today for follow up of chronic medical problems.  Outpatient Encounter Prescriptions as of 02/05/2015  Medication Sig  . albuterol (PROAIR HFA) 108 (90 BASE) MCG/ACT inhaler Inhale 2 puffs into the lungs every 6 (six) hours as needed.  . ALPRAZolam (XANAX) 0.5 MG tablet Take 1 tablet (0.5 mg total) by mouth daily.  . Fluticasone-Salmeterol (ADVAIR DISKUS) 100-50 MCG/DOSE AEPB Inhale 1 puff into the lungs 2 (two) times daily.  Marland Kitchen lisinopril-hydrochlorothiazide (PRINZIDE,ZESTORETIC) 10-12.5 MG per tablet Take 1 tablet by mouth daily.  . Multiple Vitamin (MULTIVITAMIN WITH MINERALS) TABS tablet Take 1 tablet by mouth daily.  Marland Kitchen omeprazole (PRILOSEC) 20 MG capsule Take 1 capsule (20 mg total) by mouth daily.   No facility-administered encounter medications on file as of 02/05/2015.      Hypertension This is a chronic problem. The current episode started more than 1 year ago. The problem is unchanged. Pertinent negatives include no headaches. Risk factors for coronary artery disease include dyslipidemia and post-menopausal state. Past treatments include ACE inhibitors and diuretics. The current treatment provides moderate improvement. There is no history of CAD/MI or CVA.  Hyperlipidemia This is a chronic problem. The current episode started more than 1 year ago. The problem is controlled. Recent lipid tests were reviewed and are normal. She has no history of diabetes, hypothyroidism or obesity. Current antihyperlipidemic treatment includes statins. The current treatment provides moderate improvement of lipids. Compliance problems include adherence to diet and adherence to exercise.  Risk factors for coronary artery disease include dyslipidemia, hypertension and post-menopausal.  GERD Omeprazole daily keeps symptoms under control. COPD Currently on advair BID- has needed her albuterol  more in the last several weeks since it has gotten cold outside. GAD Xanax BID- keeps her calm - gets very anxious if does not take.       Review of Systems  Constitutional: Negative.   HENT: Negative.   Respiratory: Negative.   Cardiovascular: Negative.   Genitourinary: Negative.   Neurological: Negative for headaches.  Psychiatric/Behavioral: Negative.   All other systems reviewed and are negative.      Objective:   Physical Exam  Constitutional: She is oriented to person, place, and time. She appears well-developed and well-nourished.  HENT:  Nose: Nose normal.  Mouth/Throat: Oropharynx is clear and moist.  Eyes: EOM are normal.  Neck: Trachea normal, normal range of motion and full passive range of motion without pain. Neck supple. No JVD present. Carotid bruit is not present. No thyromegaly present.  Cardiovascular: Normal rate, regular rhythm, normal heart sounds and intact distal pulses.  Exam reveals no gallop and no friction rub.   No murmur heard. Pulmonary/Chest: Effort normal and breath sounds normal.  Abdominal: Soft. Bowel sounds are normal. She exhibits no distension and no mass. There is no tenderness.  Musculoskeletal: Normal range of motion.  Lymphadenopathy:    She has no cervical adenopathy.  Neurological: She is alert and oriented to person, place, and time. She has normal reflexes.  Skin: Skin is warm and dry.  Psychiatric: She has a normal mood and affect. Her behavior is normal. Judgment and thought content normal.    BP 144/84 mmHg  Pulse 65  Temp(Src) 97.6 F (36.4 C) (Oral)  Ht 5' 4"  (1.626 m)  Wt 171 lb 6.4 oz (77.747 kg)  BMI 29.41 kg/m2  Assessment & Plan:  1. Essential hypertension, benign Do not add salt to diet - CMP14+EGFR - lisinopril-hydrochlorothiazide (PRINZIDE,ZESTORETIC) 10-12.5 MG tablet; Take 1 tablet by mouth daily.  Dispense: 90 tablet; Refill: 1  2. COPD bronchitis continue current meds- if start needing  albuterol daily will referr to pulmonologist - Fluticasone-Salmeterol (ADVAIR DISKUS) 100-50 MCG/DOSE AEPB; Inhale 1 puff into the lungs 2 (two) times daily.  Dispense: 180 each; Refill: 1 - albuterol (PROAIR HFA) 108 (90 Base) MCG/ACT inhaler; Inhale 2 puffs into the lungs every 6 (six) hours as needed.  Dispense: 3 Inhaler; Refill: 0  3. Gastroesophageal reflux disease, esophagitis presence not specified Avoid spicy foods Do not eat 2 hours prior to bedtime - omeprazole (PRILOSEC) 20 MG capsule; Take 1 capsule (20 mg total) by mouth daily.  Dispense: 90 capsule; Refill: 1  4. GAD (generalized anxiety disorder) Stress management - ALPRAZolam (XANAX) 0.5 MG tablet; Take 1 tablet (0.5 mg total) by mouth daily.  Dispense: 30 tablet; Refill: 1  5. PANIC DISORDER  6. Hyperlipidemia with target LDL less than 100 Low fat diet - Lipid panel  7. BMI 29.0-29.9,adult Discussed diet and exercise for person with BMI >25 Will recheck weight in 3-6 months   8. Need for hepatitis C screening test - Hepatitis C antibody  9. Screening for malignant neoplasm of the rectum - Fecal occult blood, imunochemical; Future    Labs pending Health maintenance reviewed- patient refuses immunizations Will do EKG and chest x ray at next visit Diet and exercise encouraged Continue all meds Follow up  In 6 months   [place], FNP

## 2015-02-06 LAB — CMP14+EGFR
A/G RATIO: 1.7 (ref 1.1–2.5)
ALBUMIN: 4.3 g/dL (ref 3.6–4.8)
ALT: 15 IU/L (ref 0–32)
AST: 17 IU/L (ref 0–40)
Alkaline Phosphatase: 66 IU/L (ref 39–117)
BILIRUBIN TOTAL: 0.6 mg/dL (ref 0.0–1.2)
BUN / CREAT RATIO: 13 (ref 11–26)
BUN: 12 mg/dL (ref 8–27)
CHLORIDE: 100 mmol/L (ref 96–106)
CO2: 23 mmol/L (ref 18–29)
Calcium: 10 mg/dL (ref 8.7–10.3)
Creatinine, Ser: 0.89 mg/dL (ref 0.57–1.00)
GFR calc non Af Amer: 67 mL/min/{1.73_m2} (ref >59)
GFR, EST AFRICAN AMERICAN: 77 mL/min/{1.73_m2} (ref >59)
GLOBULIN, TOTAL: 2.6 g/dL (ref 1.5–4.5)
Glucose: 96 mg/dL (ref 65–99)
POTASSIUM: 4.1 mmol/L (ref 3.5–5.2)
Sodium: 143 mmol/L (ref 134–144)
TOTAL PROTEIN: 6.9 g/dL (ref 6.0–8.5)

## 2015-02-06 LAB — LIPID PANEL
Chol/HDL Ratio: 3.9 ratio units (ref 0.0–4.4)
Cholesterol, Total: 182 mg/dL (ref 100–199)
HDL: 47 mg/dL (ref >39)
LDL Calculated: 110 mg/dL — ABNORMAL HIGH (ref 0–99)
Triglycerides: 123 mg/dL (ref 0–149)
VLDL CHOLESTEROL CAL: 25 mg/dL (ref 5–40)

## 2015-02-06 LAB — HEPATITIS C ANTIBODY

## 2015-06-25 ENCOUNTER — Encounter: Payer: Self-pay | Admitting: *Deleted

## 2015-08-04 ENCOUNTER — Encounter: Payer: Self-pay | Admitting: Nurse Practitioner

## 2015-08-04 ENCOUNTER — Ambulatory Visit (INDEPENDENT_AMBULATORY_CARE_PROVIDER_SITE_OTHER): Payer: PPO | Admitting: Nurse Practitioner

## 2015-08-04 VITALS — BP 161/75 | HR 57 | Temp 96.7°F | Ht 64.0 in | Wt 170.0 lb

## 2015-08-04 DIAGNOSIS — K219 Gastro-esophageal reflux disease without esophagitis: Secondary | ICD-10-CM | POA: Diagnosis not present

## 2015-08-04 DIAGNOSIS — J449 Chronic obstructive pulmonary disease, unspecified: Secondary | ICD-10-CM | POA: Diagnosis not present

## 2015-08-04 DIAGNOSIS — Z1212 Encounter for screening for malignant neoplasm of rectum: Secondary | ICD-10-CM | POA: Diagnosis not present

## 2015-08-04 DIAGNOSIS — Z6829 Body mass index (BMI) 29.0-29.9, adult: Secondary | ICD-10-CM

## 2015-08-04 DIAGNOSIS — IMO0001 Reserved for inherently not codable concepts without codable children: Secondary | ICD-10-CM

## 2015-08-04 DIAGNOSIS — E785 Hyperlipidemia, unspecified: Secondary | ICD-10-CM | POA: Diagnosis not present

## 2015-08-04 DIAGNOSIS — F411 Generalized anxiety disorder: Secondary | ICD-10-CM | POA: Diagnosis not present

## 2015-08-04 DIAGNOSIS — I10 Essential (primary) hypertension: Secondary | ICD-10-CM

## 2015-08-04 DIAGNOSIS — F410 Panic disorder [episodic paroxysmal anxiety] without agoraphobia: Secondary | ICD-10-CM

## 2015-08-04 LAB — CMP14+EGFR
ALBUMIN: 4.8 g/dL (ref 3.6–4.8)
ALK PHOS: 55 IU/L (ref 39–117)
ALT: 16 IU/L (ref 0–32)
AST: 19 IU/L (ref 0–40)
Albumin/Globulin Ratio: 1.9 (ref 1.2–2.2)
BILIRUBIN TOTAL: 0.9 mg/dL (ref 0.0–1.2)
BUN/Creatinine Ratio: 18 (ref 12–28)
BUN: 16 mg/dL (ref 8–27)
CALCIUM: 9.8 mg/dL (ref 8.7–10.3)
CHLORIDE: 98 mmol/L (ref 96–106)
CO2: 25 mmol/L (ref 18–29)
CREATININE: 0.89 mg/dL (ref 0.57–1.00)
GFR calc Af Amer: 76 mL/min/{1.73_m2} (ref >59)
GFR calc non Af Amer: 66 mL/min/{1.73_m2} (ref >59)
GLOBULIN, TOTAL: 2.5 g/dL (ref 1.5–4.5)
Glucose: 97 mg/dL (ref 65–99)
Potassium: 4.1 mmol/L (ref 3.5–5.2)
SODIUM: 139 mmol/L (ref 134–144)
TOTAL PROTEIN: 7.3 g/dL (ref 6.0–8.5)

## 2015-08-04 LAB — LIPID PANEL
CHOLESTEROL TOTAL: 194 mg/dL (ref 100–199)
Chol/HDL Ratio: 4 ratio units (ref 0.0–4.4)
HDL: 48 mg/dL (ref >39)
LDL CALC: 111 mg/dL — AB (ref 0–99)
TRIGLYCERIDES: 177 mg/dL — AB (ref 0–149)
VLDL CHOLESTEROL CAL: 35 mg/dL (ref 5–40)

## 2015-08-04 MED ORDER — FLUTICASONE-SALMETEROL 100-50 MCG/DOSE IN AEPB: 1.0000 | INHALATION_SPRAY | Freq: Two times a day (BID) | RESPIRATORY_TRACT | Status: DC

## 2015-08-04 MED ORDER — LISINOPRIL-HYDROCHLOROTHIAZIDE 10-12.5 MG PO TABS: 1.0000 | ORAL_TABLET | Freq: Every day | ORAL | Status: DC

## 2015-08-04 MED ORDER — ALPRAZOLAM 0.5 MG PO TABS: 0.5000 mg | ORAL_TABLET | Freq: Every day | ORAL | Status: DC

## 2015-08-04 MED ORDER — OMEPRAZOLE 20 MG PO CPDR: 20.0000 mg | DELAYED_RELEASE_CAPSULE | Freq: Every day | ORAL | Status: DC

## 2015-08-04 NOTE — Progress Notes (Signed)
Subjective:    Patient ID: Gabriela Wilson, female    DOB: [birth year], 70 y.o.   MRN: [id number]   Patient here today for follow up of chronic medical problems.  Outpatient Encounter Prescriptions as of 08/04/2015  Medication Sig  . albuterol (PROAIR HFA) 108 (90 Base) MCG/ACT inhaler Inhale 2 puffs into the lungs every 6 (six) hours as needed.  . ALPRAZolam (XANAX) 0.5 MG tablet Take 1 tablet (0.5 mg total) by mouth daily.  . Fluticasone-Salmeterol (ADVAIR DISKUS) 100-50 MCG/DOSE AEPB Inhale 1 puff into the lungs 2 (two) times daily.  Marland Kitchen lisinopril-hydrochlorothiazide (PRINZIDE,ZESTORETIC) 10-12.5 MG tablet Take 1 tablet by mouth daily.  . Multiple Vitamin (MULTIVITAMIN WITH MINERALS) TABS tablet Take 1 tablet by mouth daily.  Marland Kitchen omeprazole (PRILOSEC) 20 MG capsule Take 1 capsule (20 mg total) by mouth daily.   No facility-administered encounter medications on file as of 08/04/2015.   Hypertension This is a chronic problem. The current episode started more than 1 year ago. The problem is unchanged (patient checks blood pressure at home and it runs normal- she is on OTC sinus meds right now that tend to raise blood pressure.). Pertinent negatives include no headaches. Risk factors for coronary artery disease include dyslipidemia and post-menopausal state. Past treatments include ACE inhibitors and diuretics. The current treatment provides moderate improvement. There is no history of CAD/MI or CVA.  Hyperlipidemia This is a chronic problem. The current episode started more than 1 year ago. The problem is controlled. Recent lipid tests were reviewed and are normal. She has no history of diabetes, hypothyroidism or obesity. Current antihyperlipidemic treatment includes statins. The current treatment provides moderate improvement of lipids. Compliance problems include adherence to diet and adherence to exercise.  Risk factors for coronary artery disease include dyslipidemia, hypertension and  post-menopausal.  GERD Omeprazole daily keeps symptoms under control. COPD Currently on advair BID- Only uses albuterol inhaler maybe 1x per week- usually only needs if she is outside in hot sun.  GAD Xanax BID- keeps her calm - gets very anxious if does not take.       Review of Systems  Constitutional: Negative.   HENT: Negative.   Respiratory: Negative.   Cardiovascular: Negative.   Genitourinary: Negative.   Neurological: Negative for headaches.  Psychiatric/Behavioral: Negative.   All other systems reviewed and are negative.      Objective:   Physical Exam  Constitutional: She is oriented to person, place, and time. She appears well-developed and well-nourished.  HENT:  Nose: Nose normal.  Mouth/Throat: Oropharynx is clear and moist.  Eyes: EOM are normal.  Neck: Trachea normal, normal range of motion and full passive range of motion without pain. Neck supple. No JVD present. Carotid bruit is not present. No thyromegaly present.  Cardiovascular: Normal rate, regular rhythm, normal heart sounds and intact distal pulses.  Exam reveals no gallop and no friction rub.   No murmur heard. Pulmonary/Chest: Effort normal and breath sounds normal.  Abdominal: Soft. Bowel sounds are normal. She exhibits no distension and no mass. There is no tenderness.  Musculoskeletal: Normal range of motion.  Lymphadenopathy:    She has no cervical adenopathy.  Neurological: She is alert and oriented to person, place, and time. She has normal reflexes.  Skin: Skin is warm and dry.  Psychiatric: She has a normal mood and affect. Her behavior is normal. Judgment and thought content normal.     BP 161/75 mmHg  Pulse 57  Temp(Src) 96.7 F (35.9 C) (Oral)  Ht 5' 4"  (1.626 m)  Wt 170 lb (77.111 kg)  BMI 29.17 kg/m2         Assessment & Plan:  1. Essential hypertension, benign Do not add salt to diet Avoid decongestants Keep diary of blood pressures at home -  lisinopril-hydrochlorothiazide (PRINZIDE,ZESTORETIC) 10-12.5 MG tablet; Take 1 tablet by mouth daily.  Dispense: 90 tablet; Refill: 1 - CMP14+EGFR  2. COPD bronchitis Avoid cigarette smoke - Fluticasone-Salmeterol (ADVAIR DISKUS) 100-50 MCG/DOSE AEPB; Inhale 1 puff into the lungs 2 (two) times daily.  Dispense: 180 each; Refill: 1  3. Gastroesophageal reflux disease, esophagitis presence not specified Avoid spicy foods Do not eat 2 hours prior to bedtime - omeprazole (PRILOSEC) 20 MG capsule; Take 1 capsule (20 mg total) by mouth daily.  Dispense: 90 capsule; Refill: 1  4. GAD (generalized anxiety disorder) Stress management - ALPRAZolam (XANAX) 0.5 MG tablet; Take 1 tablet (0.5 mg total) by mouth daily.  Dispense: 30 tablet; Refill: 2  5. PANIC DISORDER  6. Hyperlipidemia with target LDL less than 100 Low fat diet - Lipid panel  7. BMI 29.0-29.9,adult Discussed diet and exercise for person with BMI >25 Will recheck weight in 3-6 months   Patient to schedule mammogram Patient brought hemoccult cards in with her Labs pending Health maintenance reviewed Diet and exercise encouraged Continue all meds Follow up  In 6 months   [place], FNP

## 2015-08-04 NOTE — Addendum Note (Signed)
Addended by: Rolena Infante on: 08/04/2015 11:08 AM   Modules accepted: Orders

## 2015-08-06 ENCOUNTER — Ambulatory Visit: Payer: PPO | Admitting: Nurse Practitioner

## 2015-08-07 LAB — FECAL OCCULT BLOOD, IMMUNOCHEMICAL: Fecal Occult Bld: NEGATIVE

## 2015-08-19 ENCOUNTER — Other Ambulatory Visit: Payer: Self-pay | Admitting: *Deleted

## 2015-08-19 DIAGNOSIS — Z1231 Encounter for screening mammogram for malignant neoplasm of breast: Secondary | ICD-10-CM

## 2015-10-20 ENCOUNTER — Ambulatory Visit (INDEPENDENT_AMBULATORY_CARE_PROVIDER_SITE_OTHER): Payer: PPO

## 2015-10-20 DIAGNOSIS — Z23 Encounter for immunization: Secondary | ICD-10-CM | POA: Diagnosis not present

## 2015-10-28 ENCOUNTER — Ambulatory Visit (HOSPITAL_COMMUNITY)
Admission: RE | Admit: 2015-10-28 | Discharge: 2015-10-28 | Disposition: A | Discharge status: Home / Self Care | Payer: PPO | Source: Ambulatory Visit | Attending: Nurse Practitioner | Admitting: Nurse Practitioner

## 2015-10-28 DIAGNOSIS — Z1231 Encounter for screening mammogram for malignant neoplasm of breast: Secondary | ICD-10-CM | POA: Diagnosis not present

## 2016-01-10 ENCOUNTER — Encounter: Payer: Self-pay | Admitting: Nurse Practitioner

## 2016-01-10 ENCOUNTER — Ambulatory Visit (INDEPENDENT_AMBULATORY_CARE_PROVIDER_SITE_OTHER): Payer: PPO | Admitting: Nurse Practitioner

## 2016-01-10 VITALS — BP 136/78 | HR 64 | Temp 97.4°F | Ht 64.0 in | Wt 170.0 lb

## 2016-01-10 DIAGNOSIS — K219 Gastro-esophageal reflux disease without esophagitis: Secondary | ICD-10-CM

## 2016-01-10 DIAGNOSIS — E785 Hyperlipidemia, unspecified: Secondary | ICD-10-CM

## 2016-01-10 DIAGNOSIS — J410 Simple chronic bronchitis: Secondary | ICD-10-CM | POA: Diagnosis not present

## 2016-01-10 DIAGNOSIS — J0111 Acute recurrent frontal sinusitis: Secondary | ICD-10-CM | POA: Diagnosis not present

## 2016-01-10 DIAGNOSIS — I10 Essential (primary) hypertension: Secondary | ICD-10-CM

## 2016-01-10 DIAGNOSIS — F410 Panic disorder [episodic paroxysmal anxiety] without agoraphobia: Secondary | ICD-10-CM | POA: Diagnosis not present

## 2016-01-10 DIAGNOSIS — F411 Generalized anxiety disorder: Secondary | ICD-10-CM | POA: Diagnosis not present

## 2016-01-10 DIAGNOSIS — Z6829 Body mass index (BMI) 29.0-29.9, adult: Secondary | ICD-10-CM | POA: Diagnosis not present

## 2016-01-10 MED ORDER — ALPRAZOLAM 0.5 MG PO TABS: 0.5000 mg | ORAL_TABLET | Freq: Every day | ORAL | 2 refills | Status: DC

## 2016-01-10 MED ORDER — LISINOPRIL-HYDROCHLOROTHIAZIDE 10-12.5 MG PO TABS: 1.0000 | ORAL_TABLET | Freq: Every day | ORAL | 1 refills | Status: DC

## 2016-01-10 MED ORDER — OMEPRAZOLE 20 MG PO CPDR: 20.0000 mg | DELAYED_RELEASE_CAPSULE | Freq: Every day | ORAL | 1 refills | Status: DC

## 2016-01-10 MED ORDER — AZITHROMYCIN 250 MG PO TABS
ORAL_TABLET | ORAL | 0 refills | Status: DC
Start: 2016-01-10 — End: 2016-07-17

## 2016-01-10 MED ORDER — IPRATROPIUM-ALBUTEROL 20-100 MCG/ACT IN AERS: 1.0000 | INHALATION_SPRAY | Freq: Four times a day (QID) | RESPIRATORY_TRACT | 3 refills | Status: DC

## 2016-01-10 NOTE — Progress Notes (Signed)
Subjective:    Patient ID: Gabriela Wilson, female    DOB: October 15, 1945, 70 y.o.   MRN: [id number]   Patient here today for follow up of chronic medical problems. NO changes since last visit.   Outpatient Encounter Prescriptions as of 01/10/2016  Medication Sig  . albuterol (PROAIR HFA) 108 (90 Base) MCG/ACT inhaler Inhale 2 puffs into the lungs every 6 (six) hours as needed.  . ALPRAZolam (XANAX) 0.5 MG tablet Take 1 tablet (0.5 mg total) by mouth daily.  . Fluticasone-Salmeterol (ADVAIR DISKUS) 100-50 MCG/DOSE AEPB Inhale 1 puff into the lungs 2 (two) times daily.  Marland Kitchen lisinopril-hydrochlorothiazide (PRINZIDE,ZESTORETIC) 10-12.5 MG tablet Take 1 tablet by mouth daily.  . Multiple Vitamin (MULTIVITAMIN WITH MINERALS) TABS tablet Take 1 tablet by mouth daily.  Marland Kitchen omeprazole (PRILOSEC) 20 MG capsule Take 1 capsule (20 mg total) by mouth daily.   No facility-administered encounter medications on file as of 01/10/2016.    Hypertension  This is a chronic problem. The current episode started more than 1 year ago. The problem is unchanged (patient checks blood pressure at home and it runs normal- she is on OTC sinus meds right now that tend to raise blood pressure.). Pertinent negatives include no headaches. Risk factors for coronary artery disease include dyslipidemia and post-menopausal state. Past treatments include ACE inhibitors and diuretics. The current treatment provides moderate improvement. There is no history of CAD/MI or CVA.  Hyperlipidemia  This is a chronic problem. The current episode started more than 1 year ago. The problem is controlled. Recent lipid tests were reviewed and are normal. She has no history of diabetes, hypothyroidism or obesity. Current antihyperlipidemic treatment includes statins. The current treatment provides moderate improvement of lipids. Compliance problems include adherence to diet and adherence to exercise.  Risk factors for coronary artery disease include  dyslipidemia, hypertension and post-menopausal.  GERD Omeprazole daily keeps symptoms under control. COPD Currently on advair BID- Only uses albuterol inhaler maybe 1x per week- usually only needs if she is outside in hot sun.  GAD/panic disorder Xanax BID- keeps her calm - gets very anxious if does not take.  * C/o congestion, and facial pain that started about 10 days ago- no fever.   Review of Systems  Constitutional: Negative.   HENT: Positive for congestion and sinus pressure. Negative for ear pain, sore throat, trouble swallowing and voice change.   Respiratory: Negative.  Negative for cough.   Cardiovascular: Negative.   Gastrointestinal: Negative.   Genitourinary: Negative.   Neurological: Negative.  Negative for headaches.  Psychiatric/Behavioral: Negative.   All other systems reviewed and are negative.      Objective:   Physical Exam  Constitutional: She is oriented to person, place, and time. She appears well-developed and well-nourished.  HENT:  Nose: Nose normal.  Mouth/Throat: Oropharynx is clear and moist.  Eyes: EOM are normal.  Neck: Trachea normal, normal range of motion and full passive range of motion without pain. Neck supple. No JVD present. Carotid bruit is not present. No thyromegaly present.  Cardiovascular: Normal rate, regular rhythm, normal heart sounds and intact distal pulses.  Exam reveals no gallop and no friction rub.   No murmur heard. Pulmonary/Chest: Effort normal and breath sounds normal.  Abdominal: Soft. Bowel sounds are normal. She exhibits no distension and no mass. There is no tenderness.  Musculoskeletal: Normal range of motion.  Lymphadenopathy:    She has no cervical adenopathy.  Neurological: She is alert and oriented to person, place, and  time. She has normal reflexes.  Skin: Skin is warm and dry.  Psychiatric: She has a normal mood and affect. Her behavior is normal. Judgment and thought content normal.   BP 136/78 (BP  Location: Left Arm, Cuff Size: Large)   Pulse 64   Temp 97.4 F (36.3 C) (Oral)   Ht _0  (1.626 m)   Wt 170 lb (77.1 kg)   BMI 29.18 kg/m      Assessment & Plan:  1. Essential hypertension, benign Low sodium diet - lisinopril-hydrochlorothiazide (PRINZIDE,ZESTORETIC) 10-12.5 MG tablet; Take 1 tablet by mouth daily.  Dispense: 90 tablet; Refill: 1 - CMP14+EGFR  2. Simple chronic bronchitis (HCC) - Ipratropium-Albuterol (COMBIVENT) 20-100 MCG/ACT AERS respimat; Inhale 1 puff into the lungs every 6 (six) hours.  Dispense: 1 Inhaler; Refill: 3  3. Gastroesophageal reflux disease, esophagitis presence not specified Avoid spicy foods Do not eat 2 hours prior to bedtime - omeprazole (PRILOSEC) 20 MG capsule; Take 1 capsule (20 mg total) by mouth daily.  Dispense: 90 capsule; Refill: 1  4. BMI 29.0-29.9,adult Discussed diet and exercise for person with BMI >25 Will recheck weight in 3-6 months  5. GAD (generalized anxiety disorder) Stress management - ALPRAZolam (XANAX) 0.5 MG tablet; Take 1 tablet (0.5 mg total) by mouth daily.  Dispense: 30 tablet; Refill: 2  6. Hyperlipidemia with target LDL less than 100 Low fat diet - Lipid panel  7. PANIC DISORDER   8. Acute recurrent frontal sinusitis 1. Take meds as prescribed 2. Use a cool mist humidifier especially during the winter months and when heat has been humid. 3. Use saline nose sprays frequently 4. Saline irrigations of the nose can be very helpful if done frequently.  * 4X daily for 1 week*  * Use of a nettie pot can be helpful with this. Follow directions with this* 5. Drink plenty of fluids 6. Keep thermostat turn down low 7.For any cough or congestion  Use plain Mucinex- regular strength or max strength is fine   * Children- consult with Pharmacist for dosing 8. For fever or aces or pains- take tylenol or ibuprofen appropriate for age and weight.  * for fevers greater than 101 orally you may alternate ibuprofen  and tylenol every  3 hours.   - azithromycin (ZITHROMAX Z-PAK) 250 MG tablet; As directed  Dispense: 6 tablet; Refill: 0    Labs pending Health maintenance reviewed Diet and exercise encouraged Continue all meds Follow up  In 6 months   [place], FNP

## 2016-01-10 NOTE — Patient Instructions (Signed)
Stress and Stress Management Stress is a normal reaction to life events. It is what you feel when life demands more than you are used to or more than you can handle. Some stress can be useful. For example, the stress reaction can help you catch the last bus of the day, study for a test, or meet a deadline at work. But stress that occurs too often or for too long can cause problems. It can affect your emotional health and interfere with relationships and normal daily activities. Too much stress can weaken your immune system and increase your risk for physical illness. If you already have a medical problem, stress can make it worse. What are the causes? All sorts of life events may cause stress. An event that causes stress for one person may not be stressful for another person. Major life events commonly cause stress. These may be positive or negative. Examples include losing your job, moving into a new home, getting married, having a baby, or losing a loved one. Less obvious life events may also cause stress, especially if they occur day after day or in combination. Examples include working long hours, driving in traffic, caring for children, being in debt, or being in a difficult relationship. What are the signs or symptoms? Stress may cause emotional symptoms including, the following:  Anxiety. This is feeling worried, afraid, on edge, overwhelmed, or out of control.  Anger. This is feeling irritated or impatient.  Depression. This is feeling sad, down, helpless, or guilty.  Difficulty focusing, remembering, or making decisions. Stress may cause physical symptoms, including the following:  Aches and pains. These may affect your head, neck, back, stomach, or other areas of your body.  Tight muscles or clenched jaw.  Low energy or trouble sleeping. Stress may cause unhealthy behaviors, including the following:  Eating to feel better (overeating) or skipping meals.  Sleeping too little, too  much, or both.  Working too much or putting off tasks (procrastination).  Smoking, drinking alcohol, or using drugs to feel better. How is this diagnosed? Stress is diagnosed through an assessment by your health care provider. Your health care provider will ask questions about your symptoms and any stressful life events.Your health care provider will also ask about your medical history and may order blood tests or other tests. Certain medical conditions and medicine can cause physical symptoms similar to stress. Mental illness can cause emotional symptoms and unhealthy behaviors similar to stress. Your health care provider may refer you to a mental health professional for further evaluation. How is this treated? Stress management is the recommended treatment for stress.The goals of stress management are reducing stressful life events and coping with stress in healthy ways. Techniques for reducing stressful life events include the following:  Stress identification. Self-monitor for stress and identify what causes stress for you. These skills may help you to avoid some stressful events.  Time management. Set your priorities, keep a calendar of events, and learn to say "no." These tools can help you avoid making too many commitments. Techniques for coping with stress include the following:  Rethinking the problem. Try to think realistically about stressful events rather than ignoring them or overreacting. Try to find the positives in a stressful situation rather than focusing on the negatives.  Exercise. Physical exercise can release both physical and emotional tension. The key is to find a form of exercise you enjoy and do it regularly.  Relaxation techniques. These relax the body and mind. Examples include yoga,  meditation, tai chi, biofeedback, deep breathing, progressive muscle relaxation, listening to music, being out in nature, journaling, and other hobbies. Again, the key is to find one or  more that you enjoy and can do regularly.  Healthy lifestyle. Eat a balanced diet, get plenty of sleep, and do not smoke. Avoid using alcohol or drugs to relax.  Strong support network. Spend time with family, friends, or other people you enjoy being around.Express your feelings and talk things over with someone you trust. Counseling or talktherapy with a mental health professional may be helpful if you are having difficulty managing stress on your own. Medicine is typically not recommended for the treatment of stress.Talk to your health care provider if you think you need medicine for symptoms of stress. Follow these instructions at home:  Keep all follow-up visits as directed by your health care provider.  Take all medicines as directed by your health care provider. Contact a health care provider if:  Your symptoms get worse or you start having new symptoms.  You feel overwhelmed by your problems and can no longer manage them on your own. Get help right away if:  You feel like hurting yourself or someone else. This information is not intended to replace advice given to you by your health care provider. Make sure you discuss any questions you have with your health care provider. Document Released: 07/05/2000 Document Revised: 06/17/2015 Document Reviewed: 09/03/2012 Elsevier Interactive Patient Education  2017 [employer].

## 2016-01-11 ENCOUNTER — Other Ambulatory Visit: Payer: PPO

## 2016-01-11 ENCOUNTER — Other Ambulatory Visit: Payer: Self-pay

## 2016-01-11 DIAGNOSIS — E875 Hyperkalemia: Secondary | ICD-10-CM

## 2016-01-11 LAB — CMP14+EGFR
A/G RATIO: 2.3 — AB (ref 1.2–2.2)
ALK PHOS: 50 IU/L (ref 39–117)
ALT: 17 IU/L (ref 0–32)
AST: 17 IU/L (ref 0–40)
Albumin: 4.8 g/dL (ref 3.6–4.8)
BILIRUBIN TOTAL: 0.7 mg/dL (ref 0.0–1.2)
BUN/Creatinine Ratio: 16 (ref 12–28)
BUN: 19 mg/dL (ref 8–27)
CO2: 25 mmol/L (ref 18–29)
Calcium: 10.4 mg/dL — ABNORMAL HIGH (ref 8.7–10.3)
Chloride: 101 mmol/L (ref 96–106)
Creatinine, Ser: 1.17 mg/dL — ABNORMAL HIGH (ref 0.57–1.00)
GFR calc Af Amer: 55 mL/min/{1.73_m2} — ABNORMAL LOW (ref >59)
GFR calc non Af Amer: 48 mL/min/{1.73_m2} — ABNORMAL LOW (ref >59)
GLOBULIN, TOTAL: 2.1 g/dL (ref 1.5–4.5)
Glucose: 106 mg/dL — ABNORMAL HIGH (ref 65–99)
POTASSIUM: 6 mmol/L — AB (ref 3.5–5.2)
SODIUM: 143 mmol/L (ref 134–144)
Total Protein: 6.9 g/dL (ref 6.0–8.5)

## 2016-01-11 LAB — LIPID PANEL
Chol/HDL Ratio: 3.5 ratio units (ref 0.0–4.4)
Cholesterol, Total: 184 mg/dL (ref 100–199)
HDL: 53 mg/dL (ref >39)
LDL Calculated: 113 mg/dL — ABNORMAL HIGH (ref 0–99)
TRIGLYCERIDES: 92 mg/dL (ref 0–149)
VLDL Cholesterol Cal: 18 mg/dL (ref 5–40)

## 2016-01-12 LAB — CMP14+EGFR
A/G RATIO: 2.2 (ref 1.2–2.2)
ALBUMIN: 4.8 g/dL (ref 3.6–4.8)
ALK PHOS: 51 IU/L (ref 39–117)
ALT: 15 IU/L (ref 0–32)
AST: 18 IU/L (ref 0–40)
BUN / CREAT RATIO: 17 (ref 12–28)
BUN: 20 mg/dL (ref 8–27)
Bilirubin Total: 0.5 mg/dL (ref 0.0–1.2)
CO2: 26 mmol/L (ref 18–29)
CREATININE: 1.15 mg/dL — AB (ref 0.57–1.00)
Calcium: 10 mg/dL (ref 8.7–10.3)
Chloride: 97 mmol/L (ref 96–106)
GFR calc Af Amer: 56 mL/min/{1.73_m2} — ABNORMAL LOW (ref >59)
GFR calc non Af Amer: 49 mL/min/{1.73_m2} — ABNORMAL LOW (ref >59)
GLOBULIN, TOTAL: 2.2 g/dL (ref 1.5–4.5)
Glucose: 100 mg/dL — ABNORMAL HIGH (ref 65–99)
POTASSIUM: 4.9 mmol/L (ref 3.5–5.2)
SODIUM: 138 mmol/L (ref 134–144)
Total Protein: 7 g/dL (ref 6.0–8.5)

## 2016-01-19 ENCOUNTER — Encounter: Payer: PPO | Admitting: *Deleted

## 2016-07-17 ENCOUNTER — Encounter: Payer: Self-pay | Admitting: Nurse Practitioner

## 2016-07-17 ENCOUNTER — Ambulatory Visit (INDEPENDENT_AMBULATORY_CARE_PROVIDER_SITE_OTHER): Payer: PPO | Admitting: Nurse Practitioner

## 2016-07-17 VITALS — BP 155/72 | HR 62 | Temp 96.7°F | Ht 64.0 in | Wt 171.0 lb

## 2016-07-17 DIAGNOSIS — K219 Gastro-esophageal reflux disease without esophagitis: Secondary | ICD-10-CM | POA: Diagnosis not present

## 2016-07-17 DIAGNOSIS — F411 Generalized anxiety disorder: Secondary | ICD-10-CM | POA: Diagnosis not present

## 2016-07-17 DIAGNOSIS — E785 Hyperlipidemia, unspecified: Secondary | ICD-10-CM

## 2016-07-17 DIAGNOSIS — F410 Panic disorder [episodic paroxysmal anxiety] without agoraphobia: Secondary | ICD-10-CM | POA: Diagnosis not present

## 2016-07-17 DIAGNOSIS — I10 Essential (primary) hypertension: Secondary | ICD-10-CM

## 2016-07-17 DIAGNOSIS — J410 Simple chronic bronchitis: Secondary | ICD-10-CM | POA: Diagnosis not present

## 2016-07-17 DIAGNOSIS — M8580 Other specified disorders of bone density and structure, unspecified site: Secondary | ICD-10-CM | POA: Diagnosis not present

## 2016-07-17 DIAGNOSIS — Z6829 Body mass index (BMI) 29.0-29.9, adult: Secondary | ICD-10-CM

## 2016-07-17 MED ORDER — LISINOPRIL-HYDROCHLOROTHIAZIDE 10-12.5 MG PO TABS: 1.0000 | ORAL_TABLET | Freq: Every day | ORAL | 1 refills | Status: DC

## 2016-07-17 MED ORDER — FLUTICASONE-SALMETEROL 100-50 MCG/DOSE IN AEPB: 1.0000 | INHALATION_SPRAY | Freq: Two times a day (BID) | RESPIRATORY_TRACT | 1 refills | Status: DC

## 2016-07-17 MED ORDER — OMEPRAZOLE 20 MG PO CPDR: 20.0000 mg | DELAYED_RELEASE_CAPSULE | Freq: Every day | ORAL | 1 refills | Status: DC

## 2016-07-17 MED ORDER — ALPRAZOLAM 0.5 MG PO TABS: 0.5000 mg | ORAL_TABLET | Freq: Every day | ORAL | 2 refills | Status: DC

## 2016-07-17 NOTE — Progress Notes (Signed)
Subjective:    Patient ID: Gabriela Wilson, female    DOB: 1945-04-11, 71 y.o.   MRN: [id number]  HPI Gabriela Wilson is here today for follow up of chronic medical problem.  Outpatient Encounter Prescriptions as of 07/17/2016  Medication Sig  . albuterol (PROAIR HFA) 108 (90 Base) MCG/ACT inhaler Inhale 2 puffs into the lungs every 6 (six) hours as needed.  . ALPRAZolam (XANAX) 0.5 MG tablet Take 1 tablet (0.5 mg total) by mouth daily.  Marland Kitchen azithromycin (ZITHROMAX Z-PAK) 250 MG tablet As directed  . Fluticasone-Salmeterol (ADVAIR DISKUS) 100-50 MCG/DOSE AEPB Inhale 1 puff into the lungs 2 (two) times daily.  . Ipratropium-Albuterol (COMBIVENT) 20-100 MCG/ACT AERS respimat Inhale 1 puff into the lungs every 6 (six) hours.  Marland Kitchen lisinopril-hydrochlorothiazide (PRINZIDE,ZESTORETIC) 10-12.5 MG tablet Take 1 tablet by mouth daily.  . Multiple Vitamin (MULTIVITAMIN WITH MINERALS) TABS tablet Take 1 tablet by mouth daily.  Marland Kitchen omeprazole (PRILOSEC) 20 MG capsule Take 1 capsule (20 mg total) by mouth daily.   No facility-administered encounter medications on file as of 07/17/2016.     1. Essential hypertension, benign  No c/o chest pain, SOB or HA- does not check blood pressure at home  2. Simple chronic bronchitis (HCC)  No recent flare ups  3. Gastroesophageal reflux disease, esophagitis presence not specified  Omeprazole daily keeps symptome under control  4. Osteopenia, unspecified location  No c/o back pain  5. PANIC DISORDER  Xanax as needed  6. Hyperlipidemia with target LDL less than 100  Does not watch diet  7. GAD (generalized anxiety disorder)  Again xanax helps  8. BMI 29.0-29.9,adult  No recent weight gain or weight loss    New complaints: None today    Review of Systems  Constitutional: Negative for diaphoresis.  Eyes: Negative for pain.  Respiratory: Negative for shortness of breath.   Cardiovascular: Negative for chest pain, palpitations and leg swelling.    Gastrointestinal: Negative for abdominal pain.  Endocrine: Negative for polydipsia.  Musculoskeletal: Negative.   Skin: Negative for rash.  Neurological: Negative for dizziness, weakness and headaches.  Hematological: Does not bruise/bleed easily.  Psychiatric/Behavioral: Negative.        Objective:   Physical Exam  Constitutional: She is oriented to person, place, and time. She appears well-developed and well-nourished. No distress.  HENT:  Nose: Nose normal.  Mouth/Throat: Oropharynx is clear and moist.  Eyes: EOM are normal.  Neck: Trachea normal, normal range of motion and full passive range of motion without pain. Neck supple. No JVD present. Carotid bruit is not present. No thyromegaly present.  Cardiovascular: Normal rate, regular rhythm, normal heart sounds and intact distal pulses.  Exam reveals no gallop and no friction rub.   No murmur heard. Pulmonary/Chest: Effort normal and breath sounds normal.  Abdominal: Soft. Bowel sounds are normal. She exhibits no distension and no mass. There is no tenderness.  Musculoskeletal: Normal range of motion.  Lymphadenopathy:    She has no cervical adenopathy.  Neurological: She is alert and oriented to person, place, and time. She has normal reflexes.  Skin: Skin is warm and dry.  Psychiatric: She has a normal mood and affect. Her behavior is normal. Judgment and thought content normal.    BP (!) 155/72   Pulse 62   Temp (!) 96.7 F (35.9 C) (Oral)   Ht _0  (1.626 m)   Wt 171 lb (77.6 kg)   BMI 29.35 kg/m  Assessment & Plan:  1. Essential hypertension, benign Low sodium diet - lisinopril-hydrochlorothiazide (PRINZIDE,ZESTORETIC) 10-12.5 MG tablet; Take 1 tablet by mouth daily.  Dispense: 90 tablet; Refill: 1 - CMP14+EGFR  2. Simple chronic bronchitis (HCC) Avoid cigarette smoke Do not go outside when really hot and humid - Fluticasone-Salmeterol (ADVAIR DISKUS) 100-50 MCG/DOSE AEPB; Inhale 1 puff into the  lungs 2 (two) times daily.  Dispense: 180 each; Refill: 1  3. Gastroesophageal reflux disease, esophagitis presence not specified Avoid spicy foods Do not eat 2 hours prior to bedtime - omeprazole (PRILOSEC) 20 MG capsule; Take 1 capsule (20 mg total) by mouth daily.  Dispense: 90 capsule; Refill: 1  4. Osteopenia, unspecified location Weight bearing exercises  5. PANIC DISORDER Stress management  6. Hyperlipidemia with target LDL less than 100 Low fat diet - Lipid panel  7. GAD (generalized anxiety disorder)   8. BMI 29.0-29.9,adult Discussed diet and exercise for person with BMI >25 Will recheck weight in 3-6 months     Labs pending Health maintenance reviewed Diet and exercise encouraged Continue all meds Follow up  In 6 months   [place], FNP

## 2016-07-17 NOTE — Patient Instructions (Signed)

## 2016-07-17 NOTE — Addendum Note (Signed)
Addended by: Chevis Pretty on: 07/17/2016 08:50 AM   Modules accepted: Orders

## 2016-07-18 LAB — LIPID PANEL
CHOLESTEROL TOTAL: 191 mg/dL (ref 100–199)
Chol/HDL Ratio: 3.9 ratio (ref 0.0–4.4)
HDL: 49 mg/dL (ref >39)
LDL Calculated: 113 mg/dL — ABNORMAL HIGH (ref 0–99)
Triglycerides: 144 mg/dL (ref 0–149)
VLDL CHOLESTEROL CAL: 29 mg/dL (ref 5–40)

## 2016-07-18 LAB — CMP14+EGFR
ALBUMIN: 4.5 g/dL (ref 3.5–4.8)
ALK PHOS: 49 IU/L (ref 39–117)
ALT: 23 IU/L (ref 0–32)
AST: 22 IU/L (ref 0–40)
Albumin/Globulin Ratio: 2 (ref 1.2–2.2)
BILIRUBIN TOTAL: 0.5 mg/dL (ref 0.0–1.2)
BUN / CREAT RATIO: 22 (ref 12–28)
BUN: 20 mg/dL (ref 8–27)
CO2: 27 mmol/L (ref 20–29)
Calcium: 9.8 mg/dL (ref 8.7–10.3)
Chloride: 101 mmol/L (ref 96–106)
Creatinine, Ser: 0.92 mg/dL (ref 0.57–1.00)
GFR calc Af Amer: 73 mL/min/{1.73_m2} (ref >59)
GFR calc non Af Amer: 63 mL/min/{1.73_m2} (ref >59)
GLUCOSE: 106 mg/dL — AB (ref 65–99)
Globulin, Total: 2.2 g/dL (ref 1.5–4.5)
Potassium: 4.4 mmol/L (ref 3.5–5.2)
SODIUM: 141 mmol/L (ref 134–144)
Total Protein: 6.7 g/dL (ref 6.0–8.5)

## 2016-11-02 ENCOUNTER — Other Ambulatory Visit: Payer: Self-pay | Admitting: Nurse Practitioner

## 2016-11-02 DIAGNOSIS — Z1231 Encounter for screening mammogram for malignant neoplasm of breast: Secondary | ICD-10-CM

## 2016-11-06 ENCOUNTER — Ambulatory Visit (HOSPITAL_COMMUNITY)
Admission: RE | Admit: 2016-11-06 | Discharge: 2016-11-06 | Disposition: A | Discharge status: Home / Self Care | Payer: PPO | Source: Ambulatory Visit | Attending: Nurse Practitioner | Admitting: Nurse Practitioner

## 2016-11-06 DIAGNOSIS — Z1231 Encounter for screening mammogram for malignant neoplasm of breast: Secondary | ICD-10-CM | POA: Diagnosis not present

## 2016-11-14 ENCOUNTER — Ambulatory Visit (INDEPENDENT_AMBULATORY_CARE_PROVIDER_SITE_OTHER): Payer: PPO

## 2016-11-14 DIAGNOSIS — Z23 Encounter for immunization: Secondary | ICD-10-CM | POA: Diagnosis not present

## 2017-01-11 ENCOUNTER — Encounter: Payer: Self-pay | Admitting: Nurse Practitioner

## 2017-01-11 ENCOUNTER — Ambulatory Visit: Payer: PPO | Admitting: Nurse Practitioner

## 2017-01-11 VITALS — BP 162/63 | HR 64 | Temp 97.0°F | Ht 64.0 in | Wt 173.0 lb

## 2017-01-11 DIAGNOSIS — M8580 Other specified disorders of bone density and structure, unspecified site: Secondary | ICD-10-CM

## 2017-01-11 DIAGNOSIS — I10 Essential (primary) hypertension: Secondary | ICD-10-CM | POA: Diagnosis not present

## 2017-01-11 DIAGNOSIS — J438 Other emphysema: Secondary | ICD-10-CM

## 2017-01-11 DIAGNOSIS — E785 Hyperlipidemia, unspecified: Secondary | ICD-10-CM | POA: Diagnosis not present

## 2017-01-11 DIAGNOSIS — F410 Panic disorder [episodic paroxysmal anxiety] without agoraphobia: Secondary | ICD-10-CM | POA: Diagnosis not present

## 2017-01-11 DIAGNOSIS — F411 Generalized anxiety disorder: Secondary | ICD-10-CM | POA: Diagnosis not present

## 2017-01-11 DIAGNOSIS — Z6829 Body mass index (BMI) 29.0-29.9, adult: Secondary | ICD-10-CM

## 2017-01-11 DIAGNOSIS — K219 Gastro-esophageal reflux disease without esophagitis: Secondary | ICD-10-CM | POA: Diagnosis not present

## 2017-01-11 LAB — CMP14+EGFR
ALBUMIN: 4.6 g/dL (ref 3.5–4.8)
ALK PHOS: 50 IU/L (ref 39–117)
ALT: 19 IU/L (ref 0–32)
AST: 18 IU/L (ref 0–40)
Albumin/Globulin Ratio: 2.3 — ABNORMAL HIGH (ref 1.2–2.2)
BILIRUBIN TOTAL: 0.7 mg/dL (ref 0.0–1.2)
BUN / CREAT RATIO: 16 (ref 12–28)
BUN: 16 mg/dL (ref 8–27)
CHLORIDE: 103 mmol/L (ref 96–106)
CO2: 25 mmol/L (ref 20–29)
Calcium: 10 mg/dL (ref 8.7–10.3)
Creatinine, Ser: 0.99 mg/dL (ref 0.57–1.00)
GFR calc Af Amer: 67 mL/min/{1.73_m2} (ref >59)
GFR calc non Af Amer: 58 mL/min/{1.73_m2} — ABNORMAL LOW (ref >59)
GLUCOSE: 102 mg/dL — AB (ref 65–99)
Globulin, Total: 2 g/dL (ref 1.5–4.5)
Potassium: 4.6 mmol/L (ref 3.5–5.2)
Sodium: 143 mmol/L (ref 134–144)
Total Protein: 6.6 g/dL (ref 6.0–8.5)

## 2017-01-11 LAB — LIPID PANEL
CHOLESTEROL TOTAL: 187 mg/dL (ref 100–199)
Chol/HDL Ratio: 4 ratio (ref 0.0–4.4)
HDL: 47 mg/dL (ref >39)
LDL Calculated: 117 mg/dL — ABNORMAL HIGH (ref 0–99)
Triglycerides: 116 mg/dL (ref 0–149)
VLDL CHOLESTEROL CAL: 23 mg/dL (ref 5–40)

## 2017-01-11 MED ORDER — OMEPRAZOLE 20 MG PO CPDR: 20.0000 mg | DELAYED_RELEASE_CAPSULE | Freq: Every day | ORAL | 1 refills | Status: DC

## 2017-01-11 MED ORDER — FLUTICASONE-SALMETEROL 100-50 MCG/DOSE IN AEPB: 1.0000 | INHALATION_SPRAY | Freq: Two times a day (BID) | RESPIRATORY_TRACT | 1 refills | Status: DC

## 2017-01-11 MED ORDER — LISINOPRIL-HYDROCHLOROTHIAZIDE 10-12.5 MG PO TABS
1.0000 | ORAL_TABLET | Freq: Every day | ORAL | 1 refills | Status: DC
Start: 2017-01-11 — End: 2017-07-12

## 2017-01-11 MED ORDER — ALPRAZOLAM 0.5 MG PO TABS: 0.5000 mg | ORAL_TABLET | Freq: Every day | ORAL | 2 refills | Status: DC

## 2017-01-11 MED ORDER — IPRATROPIUM-ALBUTEROL 20-100 MCG/ACT IN AERS: 1.0000 | INHALATION_SPRAY | Freq: Four times a day (QID) | RESPIRATORY_TRACT | 3 refills | Status: DC

## 2017-01-11 NOTE — Patient Instructions (Signed)
Stress and Stress Management Stress is a normal reaction to life events. It is what you feel when life demands more than you are used to or more than you can handle. Some stress can be useful. For example, the stress reaction can help you catch the last bus of the day, study for a test, or meet a deadline at work. But stress that occurs too often or for too long can cause problems. It can affect your emotional health and interfere with relationships and normal daily activities. Too much stress can weaken your immune system and increase your risk for physical illness. If you already have a medical problem, stress can make it worse. What are the causes? All sorts of life events may cause stress. An event that causes stress for one person may not be stressful for another person. Major life events commonly cause stress. These may be positive or negative. Examples include losing your job, moving into a new home, getting married, having a baby, or losing a loved one. Less obvious life events may also cause stress, especially if they occur day after day or in combination. Examples include working long hours, driving in traffic, caring for children, being in debt, or being in a difficult relationship. What are the signs or symptoms? Stress may cause emotional symptoms including, the following:  Anxiety. This is feeling worried, afraid, on edge, overwhelmed, or out of control.  Anger. This is feeling irritated or impatient.  Depression. This is feeling sad, down, helpless, or guilty.  Difficulty focusing, remembering, or making decisions.  Stress may cause physical symptoms, including the following:  Aches and pains. These may affect your head, neck, back, stomach, or other areas of your body.  Tight muscles or clenched jaw.  Low energy or trouble sleeping.  Stress may cause unhealthy behaviors, including the following:  Eating to feel better (overeating) or skipping meals.  Sleeping too little,  too much, or both.  Working too much or putting off tasks (procrastination).  Smoking, drinking alcohol, or using drugs to feel better.  How is this diagnosed? Stress is diagnosed through an assessment by your health care provider. Your health care provider will ask questions about your symptoms and any stressful life events.Your health care provider will also ask about your medical history and may order blood tests or other tests. Certain medical conditions and medicine can cause physical symptoms similar to stress. Mental illness can cause emotional symptoms and unhealthy behaviors similar to stress. Your health care provider may refer you to a mental health professional for further evaluation. How is this treated? Stress management is the recommended treatment for stress.The goals of stress management are reducing stressful life events and coping with stress in healthy ways. Techniques for reducing stressful life events include the following:  Stress identification. Self-monitor for stress and identify what causes stress for you. These skills may help you to avoid some stressful events.  Time management. Set your priorities, keep a calendar of events, and learn to say "no." These tools can help you avoid making too many commitments.  Techniques for coping with stress include the following:  Rethinking the problem. Try to think realistically about stressful events rather than ignoring them or overreacting. Try to find the positives in a stressful situation rather than focusing on the negatives.  Exercise. Physical exercise can release both physical and emotional tension. The key is to find a form of exercise you enjoy and do it regularly.  Relaxation techniques. These relax the body and  mind. Examples include yoga, meditation, tai chi, biofeedback, deep breathing, progressive muscle relaxation, listening to music, being out in nature, journaling, and other hobbies. Again, the key is to find  one or more that you enjoy and can do regularly.  Healthy lifestyle. Eat a balanced diet, get plenty of sleep, and do not smoke. Avoid using alcohol or drugs to relax.  Strong support network. Spend time with family, friends, or other people you enjoy being around.Express your feelings and talk things over with someone you trust.  Counseling or talktherapy with a mental health professional may be helpful if you are having difficulty managing stress on your own. Medicine is typically not recommended for the treatment of stress.Talk to your health care provider if you think you need medicine for symptoms of stress. Follow these instructions at home:  Keep all follow-up visits as directed by your health care provider.  Take all medicines as directed by your health care provider. Contact a health care provider if:  Your symptoms get worse or you start having new symptoms.  You feel overwhelmed by your problems and can no longer manage them on your own. Get help right away if:  You feel like hurting yourself or someone else. This information is not intended to replace advice given to you by your health care provider. Make sure you discuss any questions you have with your health care provider. Document Released: 07/05/2000 Document Revised: 06/17/2015 Document Reviewed: 09/03/2012 Elsevier Interactive Patient Education  2017 [employer]  

## 2017-01-11 NOTE — Progress Notes (Signed)
Subjective:    Patient ID: Gabriela Wilson, female    DOB: January 11, 1946, 71 y.o.   MRN: [id number]  HPI  ELIESE KERWOOD is here today for follow up of chronic medical problem.  Outpatient Encounter Medications as of 01/11/2017  Medication Sig  . albuterol (PROAIR HFA) 108 (90 Base) MCG/ACT inhaler Inhale 2 puffs into the lungs every 6 (six) hours as needed.  . ALPRAZolam (XANAX) 0.5 MG tablet Take 1 tablet (0.5 mg total) by mouth daily.  . Fluticasone-Salmeterol (ADVAIR DISKUS) 100-50 MCG/DOSE AEPB Inhale 1 puff into the lungs 2 (two) times daily.  . Ipratropium-Albuterol (COMBIVENT) 20-100 MCG/ACT AERS respimat Inhale 1 puff into the lungs every 6 (six) hours.  Marland Kitchen lisinopril-hydrochlorothiazide (PRINZIDE,ZESTORETIC) 10-12.5 MG tablet Take 1 tablet by mouth daily.  . Multiple Vitamin (MULTIVITAMIN WITH MINERALS) TABS tablet Take 1 tablet by mouth daily.  Marland Kitchen omeprazole (PRILOSEC) 20 MG capsule Take 1 capsule (20 mg total) by mouth daily.     1. Essential hypertension, benign  No c/o chest pain, sob or headache. Does not check blood pressure at home. BP Readings from Last 3 Encounters:  07/17/16 (!) 155/72  01/10/16 136/78  08/04/15 (!) 161/75     2. EMPHYSEMA  Patient is on combivent and advair. As occasional SOB bit for the most part is doing well.  3. Gastroesophageal reflux disease, esophagitis presence not specified  Omeprazole daily, keeps symptoms under control  4. Osteopenia, unspecified location  No c/o back pain- no weight bearing exercises done  5. GAD (generalized anxiety disorder)  Takes xanax no more then 1x a day.   6. PANIC DISORDER  No recent panic attacks to speak of  7. Hyperlipidemia with target LDL less than 100 Does not watch diet   8. BMI 29.0-29.9,adult  No weight cahnages    New complaints: None today  Social history: Husband still living    Review of Systems  Constitutional: Negative for activity change and appetite change.  HENT: Negative.     Eyes: Negative for pain.  Respiratory: Negative for shortness of breath.   Cardiovascular: Negative for chest pain, palpitations and leg swelling.  Gastrointestinal: Negative for abdominal pain.  Endocrine: Negative for polydipsia.  Genitourinary: Negative.   Skin: Negative for rash.  Neurological: Negative for dizziness, weakness and headaches.  Hematological: Does not bruise/bleed easily.  Psychiatric/Behavioral: Negative.   All other systems reviewed and are negative.      Objective:   Physical Exam  Constitutional: She is oriented to person, place, and time. She appears well-developed and well-nourished.  HENT:  Nose: Nose normal.  Mouth/Throat: Oropharynx is clear and moist.  Eyes: EOM are normal.  Neck: Trachea normal, normal range of motion and full passive range of motion without pain. Neck supple. No JVD present. Carotid bruit is not present. No thyromegaly present.  Cardiovascular: Normal rate, regular rhythm, normal heart sounds and intact distal pulses. Exam reveals no gallop and no friction rub.  No murmur heard. Pulmonary/Chest: Effort normal and breath sounds normal.  Abdominal: Soft. Bowel sounds are normal. She exhibits no distension and no mass. There is no tenderness.  Musculoskeletal: Normal range of motion.  Lymphadenopathy:    She has no cervical adenopathy.  Neurological: She is alert and oriented to person, place, and time. She has normal reflexes.  Skin: Skin is warm and dry.  Psychiatric: She has a normal mood and affect. Her behavior is normal. Judgment and thought content normal.   BP (!) 162/63  Pulse 64   Temp (!) 97 F (36.1 C) (Oral)   Ht 5' 4"  (1.626 m)   Wt 173 lb (78.5 kg)   BMI 29.70 kg/m       Assessment & Plan:  1. Essential hypertension, benign Low sodium diet - lisinopril-hydrochlorothiazide (PRINZIDE,ZESTORETIC) 10-12.5 MG tablet; Take 1 tablet by mouth daily.  Dispense: 90 tablet; Refill: 1 - CMP14+EGFR  2. EMPHYSEMA -  Ipratropium-Albuterol (COMBIVENT) 20-100 MCG/ACT AERS respimat; Inhale 1 puff into the lungs every 6 (six) hours.  Dispense: 1 Inhaler; Refill: 3 - Fluticasone-Salmeterol (ADVAIR DISKUS) 100-50 MCG/DOSE AEPB; Inhale 1 puff into the lungs 2 (two) times daily.  Dispense: 180 each; Refill: 1  3. Gastroesophageal reflux disease, esophagitis presence not specified Avoid spicy foods Do not eat 2 hours prior to bedtime - omeprazole (PRILOSEC) 20 MG capsule; Take 1 capsule (20 mg total) by mouth daily.  Dispense: 90 capsule; Refill: 1  4. Osteopenia, unspecified location Weight bearing exercises  5. GAD (generalized anxiety disorder) Stress management - ALPRAZolam (XANAX) 0.5 MG tablet; Take 1 tablet (0.5 mg total) by mouth daily.  Dispense: 30 tablet; Refill: 2  6. PANIC DISORDER Stress management  7. Hyperlipidemia with target LDL less than 100 Low fat diet - Lipid panel  8. BMI 29.0-29.9,adult Discussed diet and exercise for person with BMI >25 Will recheck weight in 3-6 months     Labs pending Health maintenance reviewed Diet and exercise encouraged Continue all meds Follow up  In 6 months   [place], FNP

## 2017-05-02 ENCOUNTER — Ambulatory Visit (INDEPENDENT_AMBULATORY_CARE_PROVIDER_SITE_OTHER): Payer: PPO

## 2017-05-02 VITALS — BP 133/73 | HR 66 | Temp 97.7°F | Ht 64.0 in | Wt 174.0 lb

## 2017-05-02 DIAGNOSIS — Z0000 Encounter for general adult medical examination without abnormal findings: Secondary | ICD-10-CM | POA: Diagnosis not present

## 2017-05-02 NOTE — Progress Notes (Signed)
Subjective:   Gabriela Wilson is a 72 y.o. female who presents for an Initial Medicare Annual Wellness Visit. She is very pleasant. She lives here in [place] with her husband. She has 3 children with her first husband. She states that her first husband was not the best husband and that she was single for several years raising the children. Her second husband is taking really good care of her and she seems to be doing well. They have been married for 5 years. She has 2 daughters and 1 son and they all don't live very far away. She talks to all of them on a regular basis and visits. She is very active and attends church on a regular basis. She is a member of a group called [employer] that hold benefits for people suffering with cancer. She tries to walk daily for exercise if the weather is good and she also has a treadmill in her garage. She watches YouTube videos about senior exercises and does these 15 minutes daily on a regular basis. She suffers from COPD and is a previous smoker. 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   Review of Systems      Cardiac Risk Factors include: advanced age (>7men, >30 women);smoking/ tobacco exposure     Objective:    Today's Vitals   05/02/17 0908  BP: 133/73  Pulse: 66  Temp: 97.7 F (36.5 C)  TempSrc: Oral  Weight: 174 lb (78.9 kg)  Height: 5\' 4"  (1.626 m)   Body mass index is 29.87 kg/m.  Advanced Directives 05/02/2017 07/31/2014 06/10/2014  Does Patient Have a Medical Advance Directive? No No No  Would patient like information on creating a medical advance directive? Yes (ED - Information included in AVS) Yes - Educational materials given Yes - Scientist, clinical (histocompatibility and immunogenetics) given;No - patient declined information   Patient requested that information be given about an Advanced Directive. She states that she has been thinking about this and  would like to have more info. Information printed in the AVS.  Current Medications (verified) Outpatient Encounter Medications as of 05/02/2017  Medication Sig  . albuterol (PROAIR HFA) 108 (90 Base) MCG/ACT inhaler Inhale 2 puffs into the lungs every 6 (six) hours as needed.  . ALPRAZolam (XANAX) 0.5 MG tablet Take 1 tablet (0.5 mg total) by mouth daily.  . Fluticasone-Salmeterol (ADVAIR DISKUS) 100-50 MCG/DOSE AEPB Inhale 1 puff into the lungs 2 (two) times daily.  . Ipratropium-Albuterol (COMBIVENT) 20-100 MCG/ACT AERS respimat Inhale 1 puff into the lungs every 6 (six) hours.  Marland Kitchen lisinopril-hydrochlorothiazide (PRINZIDE,ZESTORETIC) 10-12.5 MG tablet Take 1 tablet by mouth daily.  . Multiple Vitamin (MULTIVITAMIN WITH MINERALS) TABS tablet Take 1 tablet by mouth daily.  Marland Kitchen omeprazole (PRILOSEC) 20 MG capsule Take 1 capsule (20 mg total) by mouth daily.   No facility-administered encounter medications on file as of 05/02/2017.     Allergies (verified) Patient has no known allergies.   History: Past Medical History:  Diagnosis Date  . Asthma   . COPD (chronic obstructive pulmonary disease) ([place])   . Hyperlipidemia   . Hypertension   . Osteopenia    Past Surgical History:  Procedure Laterality Date  . TUBAL LIGATION     Family History  Problem Relation Age of Onset  . Asthma Mother   . Hypertension Mother   . Dementia Mother   . Diabetes Father   .  COPD Father   . Diabetes Sister   . Hypertension Sister   . Diabetes Brother   . Early death Brother        MVA with quadrapelgic  . Diabetes Brother   . Dementia Brother   . Diabetes Brother   . Cancer Sister        vulva  . Colon cancer Neg Hx    Social History   Socioeconomic History  . Marital status: Married    Spouse name: Not on file  . Number of children: 3  . Years of education: Not on file  . Highest education level: 11th grade  Occupational History    Employer: [employer]    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    Social Needs  . Financial resource strain: Not hard at all  . Food insecurity:    Worry: Never true    Inability: Never true  . Transportation needs:    Medical: No    Non-medical: No  Tobacco Use  . Smoking status: Former Smoker    Last attempt to quit: 01/24/1992    Years since quitting: 25.2  . Smokeless tobacco: Never Used  Substance and Sexual Activity  . Alcohol use: Yes    Comment: wine about every 2 weeks  . Drug use: No  . Sexual activity: Yes  Lifestyle  . Physical activity:    Days per week: 5 days    Minutes per session: 30 min  . Stress: Not at all  Relationships  . Social connections:    Talks on phone: More than three times a week    Gets together: More than three times a week    Attends religious service: Never    Active member of club or organization: Yes    Attends meetings of clubs or organizations: Never    Relationship status: Married  Other Topics Concern  . Not on file  Social History Narrative  . Not on file    Tobacco Counseling Patient is a former smoker  Clinical Intake:  Pre-visit preparation completed: Yes  Pain : No/denies pain     BMI - recorded: 29.7 Nutritional Status: BMI 25 -29 Overweight Nutritional Risks: None Diabetes: No  How often do you need to have someone help you when you read instructions, pamphlets, or other written materials from your doctor or pharmacy?: 1 - Never What is the last grade level you completed in school?: 11th grade  Interpreter Needed?: No      Activities of Daily Living In your present state of health, do you have any difficulty performing the following activities: 05/02/2017  Hearing? 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 Y  Comment Has reading glasses  Difficulty concentrating or making decisions? N  Walking or climbing stairs? N  Dressing or bathing? N  Doing errands, shopping? N  Preparing Food and eating ? N  Using the Toilet? N  In the  past six months, have you accidently leaked urine? N  Do you have problems with loss of bowel control? N  Managing your Medications? N  Managing your Finances? N  Housekeeping or managing your Housekeeping? N  Some recent data might be hidden     Immunizations and Health Maintenance Immunization History  Administered Date(s) Administered  . Influenza, High Dose Seasonal PF 10/20/2015, 11/14/2016  . Influenza,inj,Quad PF,6+ Mos 12/13/2012, 11/18/2013, 11/24/2014   Health Maintenance Due  Topic Date Due  . TETANUS/TDAP  01/23/2013  . DEXA  SCAN  06/09/2016  . COLON CANCER SCREENING ANNUAL FOBT  08/03/2016   Advised patient that she is due a tetanus shot and also a dexa scan at her next follow up appt  Patient Care Team: Chevis Pretty, FNP as PCP - General (Nurse Practitioner)  Indicate any recent Medical Services you may have received from other than [facility] providers in the past year (date may be approximate).     Assessment:   This is a routine wellness examination for Zalia.  Hearing/Vision screen No exam data present  Dietary issues and exercise activities discussed: Current Exercise Habits: Home exercise routine, Type of exercise: stretching;walking, Time (Minutes): 30, Frequency (Times/Week): 5, Weekly Exercise (Minutes/Week): 150, Intensity: Moderate, Exercise limited by: respiratory conditions(s)  Goals    . Exercise 150 minutes per week (moderate activity)    . Reduce calorie intake to 1500 calories per day      Increase non-starchy vegetables - carrots, green bean, squash, zucchini, tomatoes, onions, peppers, spinach and other green leafy vegetables, cabbage, lettuce, cucumbers, asparagus, okra (not fried), eggplant limit sugar and processed foods (cakes, cookies, ice cream, crackers and chips) Increase fresh fruit but limit serving sizes 1/2 cup or about the size of tennis or baseball limit red meat to no more than 1-2 times per week (serving size about the  size of your palm) Choose whole grains / lean proteins - whole wheat bread, quinoa, whole grain rice (1/2 cup), fish, chicken, [place]       Depression Screen [facility] 2/9 Scores 05/02/2017 01/11/2017 07/17/2016 01/10/2016 08/04/2015 11/05/2014 07/31/2014  PHQ - 2 Score 0 0 0 0 0 0 0  Exception Documentation - - - - - - -    Fall Risk Fall Risk  05/02/2017 01/11/2017 07/17/2016 01/10/2016 08/04/2015  Falls in the past year? No No No No No    Is the patient's home free of loose throw rugs in walkways, pet beds, electrical cords, etc?   yes      Grab bars in the bathroom? no      Handrails on the stairs?   yes      Adequate lighting?   yes    Cognitive Function: MMSE - Mini Mental State Exam 05/02/2017 06/10/2014  Orientation to time 5 5  Orientation to Place 5 5  Registration 3 3  Attention/ Calculation 5 4  Recall 3 3  Language- name 2 objects 2 2  Language- repeat 1 1  Language- follow 3 step command 3 3  Language- read & follow direction 1 1  Write a sentence 1 1  Copy design 1 1  Total score 30 29        Screening Tests Health Maintenance  Topic Date Due  . TETANUS/TDAP  01/23/2013  . DEXA SCAN  06/09/2016  . COLON CANCER SCREENING ANNUAL FOBT  08/03/2016  . PNA vac Low Risk Adult (1 of 2 - PCV13) 07/17/2017 (Originally 06/01/2011)  . INFLUENZA VACCINE  08/23/2017  . MAMMOGRAM  11/06/2017  . COLONOSCOPY  10/27/2020  . Hepatitis C Screening  Completed    Qualifies for Shingles Vaccine? Will discuss shingles vaccine at follow up appt with provider  Cancer Screenings: Lung: Low Dose CT Chest recommended if Age 3-80 years, 30 pack-year currently smoking OR have quit w/in 15years. Patient does not qualify. Breast: Up to date on Mammogram? Yes   Up to date of Bone Density/Dexa? No  Patient will have done at follow up appt Colorectal: Patient had a colonoscopy on 10/28/2010. Not  due until 10/27/2020  Additional Screenings: Hepatitis C Screening: Completed on 02/05/2015 with a  negative result     Plan:     I have personally reviewed and noted the following in the patient's chart:   . Medical and social history . Use of alcohol, tobacco or illicit drugs  . Current medications and supplements . Functional ability and status . Nutritional status . Physical activity . Advanced directives . List of other physicians . Hospitalizations, surgeries, and ER visits in previous 12 months . Vitals . Screenings to include cognitive, depression, and falls . Referrals and appointments  In addition, I have reviewed and discussed with patient certain preventive protocols, quality metrics, and best practice recommendations. A written personalized care plan for preventive services as well as general preventive health recommendations were provided to patient.   Patient is due for follow up in June. Dexa scan will be done at that time along with discussion of shingles vaccine. She is also due an EKG and CXR.   Rolena Infante, LPN   7/35/3299     I have reviewed and agree with the above AWV documentation.   Assunta Found, MD [facility] 05/02/2017, 1:37 PM

## 2017-05-02 NOTE — Patient Instructions (Addendum)
Heart-Healthy Eating Plan Heart-healthy meal planning includes:  Limiting unhealthy fats.  Increasing healthy fats.  Making other small dietary changes.  You may need to talk with your doctor or a diet specialist (dietitian) to create an eating plan that is right for you. What types of fat should I choose?  Choose healthy fats. These include olive oil and canola oil, flaxseeds, walnuts, almonds, and seeds.  Eat more omega-3 fats. These include salmon, mackerel, sardines, tuna, flaxseed oil, and ground flaxseeds. Try to eat fish at least twice each week.  Limit saturated fats. ? Saturated fats are often found in animal products, such as meats, butter, and cream. ? Plant sources of saturated fats include palm oil, palm kernel oil, and coconut oil.  Avoid foods with partially hydrogenated oils in them. These include stick margarine, some tub margarines, cookies, crackers, and other baked goods. These contain trans fats. What general guidelines do I need to follow?  Check food labels carefully. Identify foods with trans fats or high amounts of saturated fat.  Fill one half of your plate with vegetables and green salads. Eat 4-5 servings of vegetables per day. A serving of vegetables is: ? 1 cup of raw leafy vegetables. ?  cup of raw or cooked cut-up vegetables. ?  cup of vegetable juice.  Fill one fourth of your plate with whole grains. Look for the word "whole" as the first word in the ingredient list.  Fill one fourth of your plate with lean protein foods.  Eat 4-5 servings of fruit per day. A serving of fruit is: ? One medium whole fruit. ?  cup of dried fruit. ?  cup of fresh, frozen, or canned fruit. ?  cup of 100% fruit juice.  Eat more foods that contain soluble fiber. These include apples, broccoli, carrots, beans, peas, and barley. Try to get 20-30 g of fiber per day.  Eat more home-cooked food. Eat less restaurant, buffet, and fast food.  Limit or avoid  alcohol.  Limit foods high in starch and sugar.  Avoid fried foods.  Avoid frying your food. Try baking, boiling, grilling, or broiling it instead. You can also reduce fat by: ? Removing the skin from poultry. ? Removing all visible fats from meats. ? Skimming the fat off of stews, soups, and gravies before serving them. ? Steaming vegetables in water or broth.  Lose weight if you are overweight.  Eat 4-5 servings of nuts, legumes, and seeds per week: ? One serving of dried beans or legumes equals  cup after being cooked. ? One serving of nuts equals 1 ounces. ? One serving of seeds equals  ounce or one tablespoon.  You may need to keep track of how much salt or sodium you eat. This is especially true if you have high blood pressure. Talk with your doctor or dietitian to get more information. What foods can I eat? Grains Breads, including French, white, pita, wheat, raisin, rye, oatmeal, and Italian. Tortillas that are neither fried nor made with lard or trans fat. Low-fat rolls, including hotdog and hamburger buns and English muffins. Biscuits. Muffins. Waffles. Pancakes. Light popcorn. Whole-grain cereals. Flatbread. Melba toast. Pretzels. Breadsticks. Rusks. Low-fat snacks. Low-fat crackers, including oyster, saltine, matzo, graham, animal, and rye. Rice and pasta, including brown rice and pastas that are made with whole wheat. Vegetables All vegetables. Fruits All fruits, but limit coconut. Meats and Other Protein Sources Lean, well-trimmed beef, veal, pork, and lamb. Chicken and turkey without skin. All fish and shellfish.   Wild duck, rabbit, pheasant, and venison. Egg whites or low-cholesterol egg substitutes. Dried beans, peas, lentils, and tofu. Seeds and most nuts. Dairy Low-fat or nonfat cheeses, including ricotta, string, and mozzarella. Skim or 1% milk that is liquid, powdered, or evaporated. Buttermilk that is made with low-fat milk. Nonfat or low-fat  yogurt. Beverages Mineral water. Diet carbonated beverages. Sweets and Desserts Sherbets and fruit ices. Honey, jam, marmalade, jelly, and syrups. Meringues and gelatins. Pure sugar candy, such as hard candy, jelly beans, gumdrops, mints, marshmallows, and small amounts of dark chocolate. [employer] Eat all sweets and desserts in moderation. Fats and Oils Nonhydrogenated (trans-free) margarines. Vegetable oils, including soybean, sesame, sunflower, olive, peanut, safflower, corn, canola, and cottonseed. Salad dressings or mayonnaise made with a vegetable oil. Limit added fats and oils that you use for cooking, baking, salads, and as spreads. Other Cocoa powder. Coffee and tea. All seasonings and condiments. The items listed above may not be a complete list of recommended foods or beverages. Contact your dietitian for more options. What foods are not recommended? Grains Breads that are made with saturated or trans fats, oils, or whole milk. Croissants. Butter rolls. Cheese breads. Sweet rolls. Donuts. Buttered popcorn. Chow mein noodles. High-fat crackers, such as cheese or butter crackers. Meats and Other Protein Sources Fatty meats, such as hotdogs, short ribs, sausage, spareribs, bacon, rib eye roast or steak, and mutton. High-fat deli meats, such as salami and bologna. Caviar. Domestic duck and goose. Organ meats, such as kidney, liver, sweetbreads, and heart. Dairy Cream, sour cream, cream cheese, and creamed cottage cheese. Whole-milk cheeses, including blue (bleu), [place], [place], [place], American, [place], Swiss, cheddar, [place], and [place]. Whole or 2% milk that is liquid, evaporated, or condensed. Whole buttermilk. Cream sauce or high-fat cheese sauce. Yogurt that is made from whole milk. Beverages Regular sodas and juice drinks with added sugar. Sweets and Desserts Frosting. Pudding. Cookies. Cakes other than angel food cake. Candy that has milk chocolate or white  chocolate, hydrogenated fat, butter, coconut, or unknown ingredients. Buttered syrups. Full-fat ice cream or ice cream drinks. Fats and Oils Gravy that has suet, meat fat, or shortening. Cocoa butter, hydrogenated oils, palm oil, coconut oil, palm kernel oil. These can often be found in baked products, candy, fried foods, nondairy creamers, and whipped toppings. Solid fats and shortenings, including bacon fat, salt pork, lard, and butter. Nondairy cream substitutes, such as coffee creamers and sour cream substitutes. Salad dressings that are made of unknown oils, cheese, or sour cream. The items listed above may not be a complete list of foods and beverages to avoid. Contact your dietitian for more information. This information is not intended to replace advice given to you by your health care provider. Make sure you discuss any questions you have with your health care provider. Document Released: 07/11/2011 Document Revised: 06/17/2015 Document Reviewed: 07/03/2013 Elsevier Interactive Patient Education  2018 [place] Directive Advance directives are legal documents that let you make choices ahead of time about your health care and medical treatment in case you become unable to communicate for yourself. Advance directives are a way for you to communicate your wishes to family, friends, and health care providers. This can help convey your decisions about end-of-life care if you become unable to communicate. Discussing and writing advance directives should happen over time rather than all at once. Advance directives can be changed depending on your situation and what you want, even after you have signed the advance directives. If  you do not have an advance directive, some states assign family decision makers to act on your behalf based on how closely you are related to them. Each state has its own laws regarding advance directives. You may want to check with your health care provider,  attorney, or state representative about the laws in your state. There are different types of advance directives, such as:  Medical power of attorney.  Living will.  Do not resuscitate (DNR) or do not attempt resuscitation (DNAR) order.  Health care proxy and medical power of attorney A health care proxy, also called a health care agent, is a person who is appointed to make medical decisions for you in cases in which you are unable to make the decisions yourself. Generally, people choose someone they know well and trust to represent their preferences. Make sure to ask this person for an agreement to act as your proxy. A proxy may have to exercise judgment in the event of a medical decision for which your wishes are not known. A medical power of attorney is a legal document that names your health care proxy. Depending on the laws in your state, after the document is written, it may also need to be:  Signed.  Notarized.  Dated.  Copied.  Witnessed.  Incorporated into your medical record.  You may also want to appoint someone to manage your financial affairs in a situation in which you are unable to do so. This is called a durable power of attorney for finances. It is a separate legal document from the durable power of attorney for health care. You may choose the same person or someone different from your health care proxy to act as your agent in financial matters. If you do not appoint a proxy, or if there is a concern that the proxy is not acting in your best interests, a court-appointed guardian may be designated to act on your behalf. Living will A living will is a set of instructions documenting your wishes about medical care when you cannot express them yourself. Health care providers should keep a copy of your living will in your medical record. You may want to give a copy to family members or friends. To alert caregivers in case of an emergency, you can place a card in your wallet to  let them know that you have a living will and where they can find it. A living will is used if you become:  Terminally ill.  Incapacitated.  Unable to communicate or make decisions.  Items to consider in your living will include:  The use or non-use of life-sustaining equipment, such as dialysis machines and breathing machines (ventilators).  A DNR or DNAR order, which is the instruction not to use cardiopulmonary resuscitation (CPR) if breathing or heartbeat stops.  The use or non-use of tube feeding.  Withholding of food and fluids.  Comfort (palliative) care when the goal becomes comfort rather than a cure.  Organ and tissue donation.  A living will does not give instructions for distributing your money and property if you should pass away. It is recommended that you seek the advice of a lawyer when writing a will. Decisions about taxes, beneficiaries, and asset distribution will be legally binding. This process can relieve your family and friends of any concerns surrounding disputes or questions that may come up about the distribution of your assets. DNR or DNAR A DNR or DNAR order is a request not to have CPR in the event that  your heart stops beating or you stop breathing. If a DNR or DNAR order has not been made and shared, a health care provider will try to help any patient whose heart has stopped or who has stopped breathing. If you plan to have surgery, talk with your health care provider about how your DNR or DNAR order will be followed if problems occur. Summary  Advance directives are the legal documents that allow you to make choices ahead of time about your health care and medical treatment in case you become unable to communicate for yourself.  The process of discussing and writing advance directives should happen over time. You can change the advance directives, even after you have signed them.  Advance directives include DNR or DNAR orders, living wills, and  designating an agent as your medical power of attorney. This information is not intended to replace advice given to you by your health care provider. Make sure you discuss any questions you have with your health care provider. Document Released: 04/18/2007 Document Revised: 11/29/2015 Document Reviewed: 11/29/2015 Elsevier Interactive Patient Education  2017 [place].  Ms. Goldin , Thank you for taking time to come for your Medicare Wellness Visit. I appreciate your ongoing commitment to your health goals. Please review the following plan we discussed and let me know if I can assist you in the future.   These are the goals we discussed: Goals    . Exercise 150 minutes per week (moderate activity)    . Reduce calorie intake to 1500 calories per day      Increase non-starchy vegetables - carrots, green bean, squash, zucchini, tomatoes, onions, peppers, spinach and other green leafy vegetables, cabbage, lettuce, cucumbers, asparagus, okra (not fried), eggplant limit sugar and processed foods (cakes, cookies, ice cream, crackers and chips) Increase fresh fruit but limit serving sizes 1/2 cup or about the size of tennis or baseball limit red meat to no more than 1-2 times per week (serving size about the size of your palm) Choose whole grains / lean proteins - whole wheat bread, quinoa, whole grain rice (1/2 cup), fish, chicken, [place]        This is a list of the screening recommended for you and due dates:  Health Maintenance  Topic Date Due  . Tetanus Vaccine  01/23/2013  . DEXA scan (bone density measurement)  06/09/2016  . Stool Blood Test  08/03/2016  . Pneumonia vaccines (1 of 2 - PCV13) 07/17/2017*  . Flu Shot  08/23/2017  . Mammogram  11/06/2017  . Colon Cancer Screening  10/27/2020  .  Hepatitis C: One time screening is recommended by Center for Disease Control  (CDC) for  adults born from [birth year] through 1964.   Completed  *Topic was postponed. The date shown is not the  original due date.    Ms. Piccininni , Thank you for taking time to come for your Medicare Wellness Visit. I appreciate your ongoing commitment to your health goals. Please review the following plan we discussed and let me know if I can assist you in the future.   These are the goals we discussed: Goals    . Exercise 150 minutes per week (moderate activity)    . Reduce calorie intake to 1500 calories per day      Increase non-starchy vegetables - carrots, green bean, squash, zucchini, tomatoes, onions, peppers, spinach and other green leafy vegetables, cabbage, lettuce, cucumbers, asparagus, okra (not fried), eggplant limit sugar and processed foods (cakes, cookies, ice cream,  crackers and chips) Increase fresh fruit but limit serving sizes 1/2 cup or about the size of tennis or baseball limit red meat to no more than 1-2 times per week (serving size about the size of your palm) Choose whole grains / lean proteins - whole wheat bread, quinoa, whole grain rice (1/2 cup), fish, chicken, [place]        This is a list of the screening recommended for you and due dates:  Health Maintenance  Topic Date Due  . Tetanus Vaccine  01/23/2013  . DEXA scan (bone density measurement)  06/09/2016  . Stool Blood Test  08/03/2016  . Pneumonia vaccines (1 of 2 - PCV13) 07/17/2017*  . Flu Shot  08/23/2017  . Mammogram  11/06/2017  . Colon Cancer Screening  10/27/2020  .  Hepatitis C: One time screening is recommended by Center for Disease Control  (CDC) for  adults born from [birth year] through 1964.   Completed  *Topic was postponed. The date shown is not the original due date.   Advance Directive Advance directives are legal documents that let you make choices ahead of time about your health care and medical treatment in case you become unable to communicate for yourself. Advance directives are a way for you to communicate your wishes to family, friends, and health care providers. This can help convey your  decisions about end-of-life care if you become unable to communicate. Discussing and writing advance directives should happen over time rather than all at once. Advance directives can be changed depending on your situation and what you want, even after you have signed the advance directives. If you do not have an advance directive, some states assign family decision makers to act on your behalf based on how closely you are related to them. Each state has its own laws regarding advance directives. You may want to check with your health care provider, attorney, or state representative about the laws in your state. There are different types of advance directives, such as:  Medical power of attorney.  Living will.  Do not resuscitate (DNR) or do not attempt resuscitation (DNAR) order.  Health care proxy and medical power of attorney A health care proxy, also called a health care agent, is a person who is appointed to make medical decisions for you in cases in which you are unable to make the decisions yourself. Generally, people choose someone they know well and trust to represent their preferences. Make sure to ask this person for an agreement to act as your proxy. A proxy may have to exercise judgment in the event of a medical decision for which your wishes are not known. A medical power of attorney is a legal document that names your health care proxy. Depending on the laws in your state, after the document is written, it may also need to be:  Signed.  Notarized.  Dated.  Copied.  Witnessed.  Incorporated into your medical record.  You may also want to appoint someone to manage your financial affairs in a situation in which you are unable to do so. This is called a durable power of attorney for finances. It is a separate legal document from the durable power of attorney for health care. You may choose the same person or someone different from your health care proxy to act as your agent in  financial matters. If you do not appoint a proxy, or if there is a concern that the proxy is not acting in your best interests, a  court-appointed guardian may be designated to act on your behalf. Living will A living will is a set of instructions documenting your wishes about medical care when you cannot express them yourself. Health care providers should keep a copy of your living will in your medical record. You may want to give a copy to family members or friends. To alert caregivers in case of an emergency, you can place a card in your wallet to let them know that you have a living will and where they can find it. A living will is used if you become:  Terminally ill.  Incapacitated.  Unable to communicate or make decisions.  Items to consider in your living will include:  The use or non-use of life-sustaining equipment, such as dialysis machines and breathing machines (ventilators).  A DNR or DNAR order, which is the instruction not to use cardiopulmonary resuscitation (CPR) if breathing or heartbeat stops.  The use or non-use of tube feeding.  Withholding of food and fluids.  Comfort (palliative) care when the goal becomes comfort rather than a cure.  Organ and tissue donation.  A living will does not give instructions for distributing your money and property if you should pass away. It is recommended that you seek the advice of a lawyer when writing a will. Decisions about taxes, beneficiaries, and asset distribution will be legally binding. This process can relieve your family and friends of any concerns surrounding disputes or questions that may come up about the distribution of your assets. DNR or DNAR A DNR or DNAR order is a request not to have CPR in the event that your heart stops beating or you stop breathing. If a DNR or DNAR order has not been made and shared, a health care provider will try to help any patient whose heart has stopped or who has stopped breathing. If you  plan to have surgery, talk with your health care provider about how your DNR or DNAR order will be followed if problems occur. Summary  Advance directives are the legal documents that allow you to make choices ahead of time about your health care and medical treatment in case you become unable to communicate for yourself.  The process of discussing and writing advance directives should happen over time. You can change the advance directives, even after you have signed them.  Advance directives include DNR or DNAR orders, living wills, and designating an agent as your medical power of attorney. This information is not intended to replace advice given to you by your health care provider. Make sure you discuss any questions you have with your health care provider. Document Released: 04/18/2007 Document Revised: 11/29/2015 Document Reviewed: 11/29/2015 Elsevier Interactive Patient Education  2017 [employer].

## 2017-07-12 ENCOUNTER — Encounter: Payer: Self-pay | Admitting: Nurse Practitioner

## 2017-07-12 ENCOUNTER — Ambulatory Visit (INDEPENDENT_AMBULATORY_CARE_PROVIDER_SITE_OTHER): Payer: PPO | Admitting: Nurse Practitioner

## 2017-07-12 VITALS — BP 136/88 | HR 56 | Temp 96.9°F | Ht 64.0 in | Wt 171.0 lb

## 2017-07-12 DIAGNOSIS — J438 Other emphysema: Secondary | ICD-10-CM | POA: Diagnosis not present

## 2017-07-12 DIAGNOSIS — Z6829 Body mass index (BMI) 29.0-29.9, adult: Secondary | ICD-10-CM

## 2017-07-12 DIAGNOSIS — E785 Hyperlipidemia, unspecified: Secondary | ICD-10-CM | POA: Diagnosis not present

## 2017-07-12 DIAGNOSIS — J439 Emphysema, unspecified: Secondary | ICD-10-CM | POA: Diagnosis not present

## 2017-07-12 DIAGNOSIS — F410 Panic disorder [episodic paroxysmal anxiety] without agoraphobia: Secondary | ICD-10-CM

## 2017-07-12 DIAGNOSIS — K219 Gastro-esophageal reflux disease without esophagitis: Secondary | ICD-10-CM

## 2017-07-12 DIAGNOSIS — M8580 Other specified disorders of bone density and structure, unspecified site: Secondary | ICD-10-CM

## 2017-07-12 DIAGNOSIS — I10 Essential (primary) hypertension: Secondary | ICD-10-CM | POA: Diagnosis not present

## 2017-07-12 DIAGNOSIS — F411 Generalized anxiety disorder: Secondary | ICD-10-CM

## 2017-07-12 MED ORDER — ALPRAZOLAM 0.5 MG PO TABS: 0.5000 mg | ORAL_TABLET | Freq: Every day | ORAL | 2 refills | Status: DC

## 2017-07-12 MED ORDER — LISINOPRIL-HYDROCHLOROTHIAZIDE 10-12.5 MG PO TABS: 1.0000 | ORAL_TABLET | Freq: Every day | ORAL | 1 refills | Status: DC

## 2017-07-12 MED ORDER — IPRATROPIUM-ALBUTEROL 20-100 MCG/ACT IN AERS: 1.0000 | INHALATION_SPRAY | Freq: Four times a day (QID) | RESPIRATORY_TRACT | 3 refills | Status: DC

## 2017-07-12 MED ORDER — OMEPRAZOLE 20 MG PO CPDR: 20.0000 mg | DELAYED_RELEASE_CAPSULE | Freq: Every day | ORAL | 1 refills | Status: DC

## 2017-07-12 NOTE — Progress Notes (Signed)
Subjective:    Patient ID: Gabriela Wilson, female    DOB: 06-Aug-1945, 72 y.o.   MRN: [id number]   Chief Complaint: Medical Management of Chronic Issues   HPI:  1. Essential hypertension, benign  No c/o chest pain, sob or headache. Does not check blood pressures at home. BP Readings from Last 3 Encounters:  07/12/17 (!) 161/86  05/02/17 133/73  01/11/17 (!) 162/63     2. Pulmonary emphysema, unspecified emphysema type ([place])  She is on combivent and advair. She has used her proair 2x this past week. The pollen outside really bothers her.  3. Gastroesophageal reflux disease, esophagitis presence not specified  takes daily prilosec. She says when she does not take she will become symptomatic.  4. Osteopenia, unspecified location  Last BMD test was in 2016 with t score of -1.6. Patient does ot want to do anymore testing  5. PANIC DISORDER  Is currently on xanax usually takes daily. No recent panic attacks.  6. Hyperlipidemia with target LDL less than 100  Avoids fried foods most days  7. GAD (generalized anxiety disorder)  As stated previously- is on xanax daily.  8. BMI 29.0-29.9,adult  No recent weight changes.    Outpatient Encounter Medications as of 07/12/2017  Medication Sig  . albuterol (PROAIR HFA) 108 (90 Base) MCG/ACT inhaler Inhale 2 puffs into the lungs every 6 (six) hours as needed.  . ALPRAZolam (XANAX) 0.5 MG tablet Take 1 tablet (0.5 mg total) by mouth daily.  . Fluticasone-Salmeterol (ADVAIR DISKUS) 100-50 MCG/DOSE AEPB Inhale 1 puff into the lungs 2 (two) times daily.  . Ipratropium-Albuterol (COMBIVENT) 20-100 MCG/ACT AERS respimat Inhale 1 puff into the lungs every 6 (six) hours.  Marland Kitchen lisinopril-hydrochlorothiazide (PRINZIDE,ZESTORETIC) 10-12.5 MG tablet Take 1 tablet by mouth daily.  . Multiple Vitamin (MULTIVITAMIN WITH MINERALS) TABS tablet Take 1 tablet by mouth daily.  Marland Kitchen omeprazole (PRILOSEC) 20 MG capsule Take 1 capsule (20 mg total) by mouth daily.        New complaints: None today  Social history: Husband still living and is pretty healthy   Review of Systems  Constitutional: Negative for activity change and appetite change.  HENT: Negative.   Eyes: Negative for pain.  Respiratory: Negative for shortness of breath.   Cardiovascular: Negative for chest pain, palpitations and leg swelling.  Gastrointestinal: Negative for abdominal pain.  Endocrine: Negative for polydipsia.  Genitourinary: Negative.   Skin: Negative for rash.  Neurological: Negative for dizziness, weakness and headaches.  Hematological: Does not bruise/bleed easily.  Psychiatric/Behavioral: Negative.   All other systems reviewed and are negative.      Objective:   Physical Exam  Constitutional: She is oriented to person, place, and time. She appears well-developed and well-nourished. No distress.  HENT:  Head: Normocephalic.  Nose: Nose normal.  Mouth/Throat: Oropharynx is clear and moist.  Eyes: Pupils are equal, round, and reactive to light. EOM are normal.  Neck: Normal range of motion. Neck supple. No JVD present. Carotid bruit is not present.  Cardiovascular: Normal rate, regular rhythm, normal heart sounds and intact distal pulses.  Pulmonary/Chest: Effort normal and breath sounds normal. No respiratory distress. She has no wheezes. She has no rales. She exhibits no tenderness.  Abdominal: Soft. Normal appearance, normal aorta and bowel sounds are normal. She exhibits no distension, no abdominal bruit, no pulsatile midline mass and no mass. There is no splenomegaly or hepatomegaly. There is no tenderness.  Musculoskeletal: Normal range of motion. She exhibits no edema.  Lymphadenopathy:    She has no cervical adenopathy.  Neurological: She is alert and oriented to person, place, and time. She has normal reflexes.  Skin: Skin is warm and dry.  Psychiatric: She has a normal mood and affect. Her behavior is normal. Judgment and thought content  normal.   BP 136/88 (BP Location: Left Arm, Cuff Size: Normal)   Pulse (!) 56   Temp (!) 96.9 F (36.1 C) (Oral)   Ht 5' 4"  (1.626 m)   Wt 171 lb (77.6 kg)   BMI 29.35 kg/m        Assessment & Plan:  Gabriela Wilson comes in today with chief complaint of Medical Management of Chronic Issues   Diagnosis and orders addressed:  1. Essential hypertension, benign Low sodium diet - lisinopril-hydrochlorothiazide (PRINZIDE,ZESTORETIC) 10-12.5 MG tablet; Take 1 tablet by mouth daily.  Dispense: 90 tablet; Refill: 1 - CMP14+EGFR  2. Pulmonary emphysema, unspecified emphysema type ([place]) - Ipratropium-Albuterol (COMBIVENT) 20-100 MCG/ACT AERS respimat; Inhale 1 puff into the lungs every 6 (six) hours.  Dispense: 1 Inhaler; Refill: 3    3. Gastroesophageal reflux disease, esophagitis presence not specified Avoid spicy foods Do not eat 2 hours prior to bedtime - omeprazole (PRILOSEC) 20 MG capsule; Take 1 capsule (20 mg total) by mouth daily.  Dispense: 90 capsule; Refill: 1  4. Osteopenia, unspecified location Weight bearing exercises Refuses anymore dexascans  5. PANIC DISORDER Stress management  6. Hyperlipidemia with target LDL less than 100 Low fat diet - Lipid panel  7. GAD (generalized anxiety disorder) - ALPRAZolam (XANAX) 0.5 MG tablet; Take 1 tablet (0.5 mg total) by mouth daily.  Dispense: 30 tablet; Refill: 2  8. BMI 29.0-29.9,adult Discussed diet and exercise for person with BMI >25 Will recheck weight in 3-6 months    Labs pending Health Maintenance reviewed Diet and exercise encouraged  Follow up plan: 6 months   Mary-Margaret Hassell Done, FNP

## 2017-07-12 NOTE — Patient Instructions (Signed)

## 2017-07-13 LAB — LIPID PANEL
Chol/HDL Ratio: 3.6 ratio (ref 0.0–4.4)
Cholesterol, Total: 167 mg/dL (ref 100–199)
HDL: 47 mg/dL (ref >39)
LDL Calculated: 100 mg/dL — ABNORMAL HIGH (ref 0–99)
Triglycerides: 101 mg/dL (ref 0–149)
VLDL CHOLESTEROL CAL: 20 mg/dL (ref 5–40)

## 2017-07-13 LAB — CMP14+EGFR
ALBUMIN: 4.4 g/dL (ref 3.5–4.8)
ALT: 19 IU/L (ref 0–32)
AST: 17 IU/L (ref 0–40)
Albumin/Globulin Ratio: 2.1 (ref 1.2–2.2)
Alkaline Phosphatase: 45 IU/L (ref 39–117)
BUN / CREAT RATIO: 21 (ref 12–28)
BUN: 16 mg/dL (ref 8–27)
Bilirubin Total: 0.8 mg/dL (ref 0.0–1.2)
CO2: 24 mmol/L (ref 20–29)
Calcium: 9.3 mg/dL (ref 8.7–10.3)
Chloride: 104 mmol/L (ref 96–106)
Creatinine, Ser: 0.76 mg/dL (ref 0.57–1.00)
GFR calc Af Amer: 91 mL/min/{1.73_m2} (ref >59)
GFR calc non Af Amer: 79 mL/min/{1.73_m2} (ref >59)
GLUCOSE: 103 mg/dL — AB (ref 65–99)
Globulin, Total: 2.1 g/dL (ref 1.5–4.5)
Potassium: 4.5 mmol/L (ref 3.5–5.2)
Sodium: 144 mmol/L (ref 134–144)
Total Protein: 6.5 g/dL (ref 6.0–8.5)

## 2017-10-24 ENCOUNTER — Other Ambulatory Visit: Payer: Self-pay | Admitting: Nurse Practitioner

## 2017-10-24 DIAGNOSIS — Z1231 Encounter for screening mammogram for malignant neoplasm of breast: Secondary | ICD-10-CM

## 2017-11-08 ENCOUNTER — Ambulatory Visit (HOSPITAL_COMMUNITY)
Admission: RE | Admit: 2017-11-08 | Discharge: 2017-11-08 | Disposition: A | Discharge status: Home / Self Care | Payer: PPO | Source: Ambulatory Visit | Attending: Nurse Practitioner | Admitting: Nurse Practitioner

## 2017-11-08 ENCOUNTER — Encounter (HOSPITAL_COMMUNITY): Payer: Self-pay

## 2017-11-08 DIAGNOSIS — Z1231 Encounter for screening mammogram for malignant neoplasm of breast: Secondary | ICD-10-CM | POA: Diagnosis not present

## 2017-12-17 ENCOUNTER — Ambulatory Visit (INDEPENDENT_AMBULATORY_CARE_PROVIDER_SITE_OTHER): Payer: PPO | Admitting: *Deleted

## 2017-12-17 DIAGNOSIS — Z23 Encounter for immunization: Secondary | ICD-10-CM

## 2018-01-11 ENCOUNTER — Ambulatory Visit (INDEPENDENT_AMBULATORY_CARE_PROVIDER_SITE_OTHER): Payer: PPO | Admitting: Nurse Practitioner

## 2018-01-11 ENCOUNTER — Encounter: Payer: Self-pay | Admitting: Nurse Practitioner

## 2018-01-11 VITALS — BP 140/60 | HR 63 | Temp 96.8°F | Ht 64.0 in | Wt 175.0 lb

## 2018-01-11 DIAGNOSIS — K219 Gastro-esophageal reflux disease without esophagitis: Secondary | ICD-10-CM | POA: Diagnosis not present

## 2018-01-11 DIAGNOSIS — F410 Panic disorder [episodic paroxysmal anxiety] without agoraphobia: Secondary | ICD-10-CM | POA: Diagnosis not present

## 2018-01-11 DIAGNOSIS — I10 Essential (primary) hypertension: Secondary | ICD-10-CM

## 2018-01-11 DIAGNOSIS — E785 Hyperlipidemia, unspecified: Secondary | ICD-10-CM | POA: Diagnosis not present

## 2018-01-11 DIAGNOSIS — Z6829 Body mass index (BMI) 29.0-29.9, adult: Secondary | ICD-10-CM | POA: Diagnosis not present

## 2018-01-11 DIAGNOSIS — F411 Generalized anxiety disorder: Secondary | ICD-10-CM

## 2018-01-11 DIAGNOSIS — M8580 Other specified disorders of bone density and structure, unspecified site: Secondary | ICD-10-CM

## 2018-01-11 DIAGNOSIS — J439 Emphysema, unspecified: Secondary | ICD-10-CM

## 2018-01-11 DIAGNOSIS — T8182XA Emphysema (subcutaneous) resulting from a procedure, initial encounter: Secondary | ICD-10-CM

## 2018-01-11 MED ORDER — ALPRAZOLAM 0.5 MG PO TABS: 0.5000 mg | ORAL_TABLET | Freq: Every day | ORAL | 5 refills | Status: DC

## 2018-01-11 MED ORDER — ALBUTEROL SULFATE HFA 108 (90 BASE) MCG/ACT IN AERS: 2.0000 | INHALATION_SPRAY | Freq: Four times a day (QID) | RESPIRATORY_TRACT | 1 refills | Status: DC | PRN

## 2018-01-11 MED ORDER — OMEPRAZOLE 20 MG PO CPDR: 20.0000 mg | DELAYED_RELEASE_CAPSULE | Freq: Every day | ORAL | 1 refills | Status: DC

## 2018-01-11 MED ORDER — LISINOPRIL-HYDROCHLOROTHIAZIDE 10-12.5 MG PO TABS: 1.0000 | ORAL_TABLET | Freq: Every day | ORAL | 1 refills | Status: DC

## 2018-01-11 NOTE — Progress Notes (Signed)
Subjective:    Patient ID: Gabriela Wilson, female    DOB: 08-30-1945, 72 y.o.   MRN: [id number]   Chief Complaint: medical management of chronic issues  HPI:  1. Essential hypertension, benign  No c/o chest pain, sob or headache. Does not check blood pressure at home BP Readings from Last 3 Encounters:  07/12/17 136/88  05/02/17 133/73  01/11/17 (!) 162/63     2. Pulmonary emphysema, unspecified emphysema type ([place])  Uses advair and combivent daily. Still has frequent cough. Seems to be worse in winter time.   3. Gastroesophageal reflux disease, esophagitis presence not specified  Omeprazole daily. Works well to keep symptoms under control.  4. Osteopenia, unspecified location  Last dexascan was done 05/2014 with t score of -1.4. she does no weight bearing exercises. Takes viytamond and calcium. Does not want to do dexascans anymore  5. PANIC DISORDER  Has had several small episodes when she has been shopping in crowds but overall is doing well.  6. Hyperlipidemia with target LDL less than 100  Not watching diet and dong no dedicated exercise  7. GAD (generalized anxiety disorder)  takes xanax less than 2 a week  8. BMI 29.0-29.9,adult  Weight is up 4lbs since last visit.    Outpatient Encounter Medications as of 01/11/2018  Medication Sig  . albuterol (PROAIR HFA) 108 (90 Base) MCG/ACT inhaler Inhale 2 puffs into the lungs every 6 (six) hours as needed.  . ALPRAZolam (XANAX) 0.5 MG tablet Take 1 tablet (0.5 mg total) by mouth daily.  . Fluticasone-Salmeterol (ADVAIR DISKUS) 100-50 MCG/DOSE AEPB Inhale 1 puff into the lungs 2 (two) times daily.  . Ipratropium-Albuterol (COMBIVENT) 20-100 MCG/ACT AERS respimat Inhale 1 puff into the lungs every 6 (six) hours.  Marland Kitchen lisinopril-hydrochlorothiazide (PRINZIDE,ZESTORETIC) 10-12.5 MG tablet Take 1 tablet by mouth daily.  . Multiple Vitamin (MULTIVITAMIN WITH MINERALS) TABS tablet Take 1 tablet by mouth daily.  Marland Kitchen omeprazole (PRILOSEC)  20 MG capsule Take 1 capsule (20 mg total) by mouth daily.      New complaints: None today  Social history: husband living and they are doing well. Sh ehas been married to him for 6 years.   Review of Systems  Constitutional: Negative for activity change and appetite change.  HENT: Negative.   Eyes: Negative for pain.  Respiratory: Negative for shortness of breath.   Cardiovascular: Negative for chest pain, palpitations and leg swelling.  Gastrointestinal: Negative for abdominal pain.  Endocrine: Negative for polydipsia.  Genitourinary: Negative.   Skin: Negative for rash.  Neurological: Negative for dizziness, weakness and headaches.  Hematological: Does not bruise/bleed easily.  Psychiatric/Behavioral: Negative.   All other systems reviewed and are negative.      Objective:   Physical Exam Vitals signs and nursing note reviewed.  Constitutional:      General: She is not in acute distress.    Appearance: Normal appearance. She is well-developed.  HENT:     Head: Normocephalic.     Nose: Nose normal.  Eyes:     Pupils: Pupils are equal, round, and reactive to light.  Neck:     Musculoskeletal: Normal range of motion and neck supple.     Vascular: No carotid bruit or JVD.  Cardiovascular:     Rate and Rhythm: Normal rate and regular rhythm.     Heart sounds: Normal heart sounds.  Pulmonary:     Effort: Pulmonary effort is normal. No respiratory distress.     Breath sounds: Normal breath  sounds. No wheezing or rales.  Chest:     Chest wall: No tenderness.  Abdominal:     General: Bowel sounds are normal. There is no distension or abdominal bruit.     Palpations: Abdomen is soft. There is no hepatomegaly, splenomegaly, mass or pulsatile mass.     Tenderness: There is no abdominal tenderness.  Musculoskeletal: Normal range of motion.  Lymphadenopathy:     Cervical: No cervical adenopathy.  Skin:    General: Skin is warm and dry.  Neurological:     Mental  Status: She is alert and oriented to person, place, and time.     Deep Tendon Reflexes: Reflexes are normal and symmetric.  Psychiatric:        Behavior: Behavior normal.        Thought Content: Thought content normal.        Judgment: Judgment normal.    BP 140/60   Pulse 63   Temp (!) 96.8 F (36 C) (Oral)   Ht _0  (1.626 m)   Wt 175 lb (79.4 kg)   BMI 30.04 kg/m         Assessment & Plan:  Gabriela Wilson comes in today with chief complaint of Medical Management of Chronic Issues   Diagnosis and orders addressed:  1. Essential hypertension, benign Low sodium diet - lisinopril-hydrochlorothiazide (PRINZIDE,ZESTORETIC) 10-12.5 MG tablet; Take 1 tablet by mouth daily.  Dispense: 90 tablet; Refill: 1 - CMP14+EGFR  2. Pulmonary emphysema, unspecified emphysema type ([place]) proair as needed Continue combivent and advair as rx  3. Gastroesophageal reflux disease, esophagitis presence not specified Avoid spicy foods Do not eat 2 hours prior to bedtime - omeprazole (PRILOSEC) 20 MG capsule; Take 1 capsule (20 mg total) by mouth daily.  Dispense: 90 capsule; Refill: 1  4. Osteopenia, unspecified location Weight bearing exercise  5. PANIC DISORDER Stress management  6. Hyperlipidemia with target LDL less than 100 Low fat diet - Lipid panel  7. GAD (generalized anxiety disorder) Stress management - ALPRAZolam (XANAX) 0.5 MG tablet; Take 1 tablet (0.5 mg total) by mouth daily.  Dispense: 30 tablet; Refill: 5  8. BMI 29.0-29.9,adult Discussed diet and exercise for person with BMI >25 Will recheck weight in 3-6 months  9. Subcutaneous emphysema resulting from a procedure, initial encounter - albuterol (PROAIR HFA) 108 (90 Base) MCG/ACT inhaler; Inhale 2 puffs into the lungs every 6 (six) hours as needed.  Dispense: 3 Inhaler; Refill: 1   Labs pending Health Maintenance reviewed Diet and exercise encouraged  Follow up plan: 6 months    Mary-Margaret Hassell Done,  FNP

## 2018-01-11 NOTE — Patient Instructions (Signed)

## 2018-01-12 LAB — CMP14+EGFR
ALBUMIN: 4.3 g/dL (ref 3.5–4.8)
ALK PHOS: 50 IU/L (ref 39–117)
ALT: 19 IU/L (ref 0–32)
AST: 20 IU/L (ref 0–40)
Albumin/Globulin Ratio: 1.9 (ref 1.2–2.2)
BUN / CREAT RATIO: 14 (ref 12–28)
BUN: 13 mg/dL (ref 8–27)
Bilirubin Total: 0.6 mg/dL (ref 0.0–1.2)
CALCIUM: 9.6 mg/dL (ref 8.7–10.3)
CO2: 22 mmol/L (ref 20–29)
CREATININE: 0.95 mg/dL (ref 0.57–1.00)
Chloride: 99 mmol/L (ref 96–106)
GFR calc Af Amer: 70 mL/min/{1.73_m2} (ref >59)
GFR, EST NON AFRICAN AMERICAN: 60 mL/min/{1.73_m2} (ref >59)
GLUCOSE: 107 mg/dL — AB (ref 65–99)
Globulin, Total: 2.3 g/dL (ref 1.5–4.5)
Potassium: 4.4 mmol/L (ref 3.5–5.2)
Sodium: 138 mmol/L (ref 134–144)
Total Protein: 6.6 g/dL (ref 6.0–8.5)

## 2018-01-12 LAB — LIPID PANEL
Chol/HDL Ratio: 4.2 ratio (ref 0.0–4.4)
Cholesterol, Total: 187 mg/dL (ref 100–199)
HDL: 45 mg/dL (ref >39)
LDL CALC: 109 mg/dL — AB (ref 0–99)
TRIGLYCERIDES: 165 mg/dL — AB (ref 0–149)
VLDL CHOLESTEROL CAL: 33 mg/dL (ref 5–40)

## 2018-03-21 ENCOUNTER — Other Ambulatory Visit: Payer: Self-pay | Admitting: Nurse Practitioner

## 2018-03-21 DIAGNOSIS — J438 Other emphysema: Secondary | ICD-10-CM

## 2018-07-15 ENCOUNTER — Ambulatory Visit (INDEPENDENT_AMBULATORY_CARE_PROVIDER_SITE_OTHER): Payer: PPO

## 2018-07-15 ENCOUNTER — Other Ambulatory Visit: Payer: Self-pay

## 2018-07-15 ENCOUNTER — Ambulatory Visit (INDEPENDENT_AMBULATORY_CARE_PROVIDER_SITE_OTHER): Payer: PPO | Admitting: Nurse Practitioner

## 2018-07-15 ENCOUNTER — Encounter: Payer: Self-pay | Admitting: Nurse Practitioner

## 2018-07-15 VITALS — BP 157/81 | HR 68 | Temp 97.9°F | Ht 64.0 in | Wt 173.0 lb

## 2018-07-15 DIAGNOSIS — K219 Gastro-esophageal reflux disease without esophagitis: Secondary | ICD-10-CM | POA: Diagnosis not present

## 2018-07-15 DIAGNOSIS — J439 Emphysema, unspecified: Secondary | ICD-10-CM

## 2018-07-15 DIAGNOSIS — F410 Panic disorder [episodic paroxysmal anxiety] without agoraphobia: Secondary | ICD-10-CM

## 2018-07-15 DIAGNOSIS — I10 Essential (primary) hypertension: Secondary | ICD-10-CM

## 2018-07-15 DIAGNOSIS — E785 Hyperlipidemia, unspecified: Secondary | ICD-10-CM | POA: Diagnosis not present

## 2018-07-15 DIAGNOSIS — M8580 Other specified disorders of bone density and structure, unspecified site: Secondary | ICD-10-CM

## 2018-07-15 DIAGNOSIS — F411 Generalized anxiety disorder: Secondary | ICD-10-CM | POA: Diagnosis not present

## 2018-07-15 DIAGNOSIS — J438 Other emphysema: Secondary | ICD-10-CM

## 2018-07-15 DIAGNOSIS — Z6829 Body mass index (BMI) 29.0-29.9, adult: Secondary | ICD-10-CM

## 2018-07-15 DIAGNOSIS — R918 Other nonspecific abnormal finding of lung field: Secondary | ICD-10-CM | POA: Diagnosis not present

## 2018-07-15 LAB — CMP14+EGFR
ALT: 40 IU/L — ABNORMAL HIGH (ref 0–32)
AST: 33 IU/L (ref 0–40)
Albumin/Globulin Ratio: 2.4 — ABNORMAL HIGH (ref 1.2–2.2)
Albumin: 4.7 g/dL (ref 3.7–4.7)
Alkaline Phosphatase: 50 IU/L (ref 39–117)
BUN/Creatinine Ratio: 22 (ref 12–28)
BUN: 20 mg/dL (ref 8–27)
Bilirubin Total: 0.5 mg/dL (ref 0.0–1.2)
CO2: 24 mmol/L (ref 20–29)
Calcium: 10.1 mg/dL (ref 8.7–10.3)
Chloride: 99 mmol/L (ref 96–106)
Creatinine, Ser: 0.91 mg/dL (ref 0.57–1.00)
GFR calc Af Amer: 73 mL/min/{1.73_m2} (ref >59)
GFR calc non Af Amer: 63 mL/min/{1.73_m2} (ref >59)
Globulin, Total: 2 g/dL (ref 1.5–4.5)
Glucose: 105 mg/dL — ABNORMAL HIGH (ref 65–99)
Potassium: 4.2 mmol/L (ref 3.5–5.2)
Sodium: 138 mmol/L (ref 134–144)
Total Protein: 6.7 g/dL (ref 6.0–8.5)

## 2018-07-15 LAB — LIPID PANEL
Chol/HDL Ratio: 3.5 ratio (ref 0.0–4.4)
Cholesterol, Total: 182 mg/dL (ref 100–199)
HDL: 52 mg/dL (ref >39)
LDL Calculated: 107 mg/dL — ABNORMAL HIGH (ref 0–99)
Triglycerides: 116 mg/dL (ref 0–149)
VLDL Cholesterol Cal: 23 mg/dL (ref 5–40)

## 2018-07-15 MED ORDER — IPRATROPIUM-ALBUTEROL 20-100 MCG/ACT IN AERS: 1.0000 | INHALATION_SPRAY | Freq: Four times a day (QID) | RESPIRATORY_TRACT | 1 refills | Status: DC

## 2018-07-15 MED ORDER — ALPRAZOLAM 0.5 MG PO TABS: 0.5000 mg | ORAL_TABLET | Freq: Every day | ORAL | 5 refills | Status: DC

## 2018-07-15 MED ORDER — ADVAIR DISKUS 100-50 MCG/DOSE IN AEPB: INHALATION_SPRAY | RESPIRATORY_TRACT | 1 refills | Status: DC

## 2018-07-15 MED ORDER — LISINOPRIL-HYDROCHLOROTHIAZIDE 10-12.5 MG PO TABS: 1.0000 | ORAL_TABLET | Freq: Every day | ORAL | 1 refills | Status: DC

## 2018-07-15 MED ORDER — OMEPRAZOLE 20 MG PO CPDR: 20.0000 mg | DELAYED_RELEASE_CAPSULE | Freq: Every day | ORAL | 1 refills | Status: DC

## 2018-07-15 NOTE — Patient Instructions (Signed)
Osteopenia    Osteopenia is a loss of thickness (density) inside of the bones. Another name for osteopenia is low bone mass.  Mild osteopenia is a normal part of aging. It is not a disease, and it does not cause symptoms. However, if you have osteopenia and continue to lose bone mass, you could develop a condition that causes the bones to become thin and break more easily (osteoporosis). You may also lose some height, have back pain, and have a stooped posture. Although osteopenia is not a disease, making changes to your lifestyle and diet can help to prevent osteopenia from developing into osteoporosis.  What are the causes?  Osteopenia is caused by loss of calcium in the bones.   Bones are constantly changing. Old bone cells are continually being replaced with new bone cells. This process builds new bone. The mineral calcium is needed to build new bone and maintain bone density. Bone density is usually highest around age 35. After that, most people's bodies cannot replace all the bone they have lost with new bone.  What increases the risk?  You are more likely to develop this condition if:  · You are older than age 50.  · You are a woman who went through menopause early.  · You have a long illness that keeps you in bed.  · You do not get enough exercise.  · You lack certain nutrients (malnutrition).  · You have an overactive thyroid gland (hyperthyroidism).  · You smoke.  · You drink a lot of alcohol.  · You are taking medicines that weaken the bones, such as steroids.  What are the signs or symptoms?  This condition does not cause any symptoms. You may have a slightly higher risk for bone breaks (fractures), so getting fractures more easily than normal may be an indication of osteopenia.  How is this diagnosed?  Your health care provider can diagnose this condition with a special type of X-ray exam that measures bone density (dual-energy X-ray absorptiometry, DEXA). This test can measure bone density in your  hips, spine, and wrists.  Osteopenia has no symptoms, so this condition is usually diagnosed after a routine bone density screening test is done for osteoporosis. This routine screening is usually done for:  · Women who are age 65 or older.  · Men who are age 70 or older.  If you have risk factors for osteopenia, you may have the screening test at an earlier age.  How is this treated?  Making dietary and lifestyle changes can lower your risk for osteoporosis. If you have severe osteopenia that is close to becoming osteoporosis, your health care provider may prescribe medicines and dietary supplements such as calcium and vitamin D. These supplements help to rebuild bone density.  Follow these instructions at home:    · Take over-the-counter and prescription medicines only as told by your health care provider. These include vitamins and supplements.  · Eat a diet that is high in calcium and vitamin D.  ? Calcium is found in dairy products, beans, salmon, and leafy green vegetables like spinach and broccoli.  ? Look for foods that have vitamin D and calcium added to them (fortified foods), such as orange juice, cereal, and bread.  · Do 30 or more minutes of a weight-bearing exercise every day, such as walking, jogging, or playing a sport. These types of exercises strengthen the bones.  · Take precautions at home to lower your risk of falling, such as:  ?   Keeping rooms well-lit and free of clutter, such as cords.  ? Installing safety rails on stairs.  ? Using rubber mats in the bathroom or other areas that are often wet or slippery.  · Do not use any products that contain nicotine or tobacco, such as cigarettes and e-cigarettes. If you need help quitting, ask your health care provider.  · Avoid alcohol or limit alcohol intake to no more than 1 drink a day for nonpregnant women and 2 drinks a day for men. One drink equals 12 oz of beer, 5 oz of wine, or 1½ oz of hard liquor.  · Keep all follow-up visits as told by your  health care provider. This is important.  Contact a health care provider if:  · You have not had a bone density screening for osteoporosis and you are:  ? A woman, age 65 or older.  ? A man, age 70 or older.  · You are a postmenopausal woman who has not had a bone density screening for osteoporosis.  · You are older than age 50 and you want to know if you should have bone density screening for osteoporosis.  Summary  · Osteopenia is a loss of thickness (density) inside of the bones. Another name for osteopenia is low bone mass.  · Osteopenia is not a disease, but it may increase your risk for a condition that causes the bones to become thin and break more easily (osteoporosis).  · You may be at risk for osteopenia if you are older than age 50 or if you are a woman who went through early menopause.  · Osteopenia does not cause any symptoms, but it can be diagnosed with a bone density screening test.  · Dietary and lifestyle changes are the first treatment for osteopenia. These may lower your risk for osteoporosis.  This information is not intended to replace advice given to you by your health care provider. Make sure you discuss any questions you have with your health care provider.  Document Released: 10/18/2016 Document Revised: 10/18/2016 Document Reviewed: 10/18/2016  Elsevier Interactive Patient Education © 2019 [employer]

## 2018-07-15 NOTE — Addendum Note (Signed)
Addended by: Chevis Pretty on: 07/15/2018 08:51 AM   Modules accepted: Orders

## 2018-07-15 NOTE — Progress Notes (Signed)
Subjective:    Patient ID: Gabriela Wilson, female    DOB: [birth year], 73 y.o.   MRN: [id number]   Chief Complaint: Medical Management of Chronic Issues ([place]us)    HPI:  1. Essential hypertension, benign No c/o chest pain, sob or headache. Does not check blood pressure at home. BP Readings from Last 3 Encounters:  07/15/18 (!) 157/81  01/11/18 140/60  07/12/17 136/88     2. Gastroesophageal reflux disease, esophagitis presence not specified Takes omeprazole dialy and works well to keep symptoms under control.  3. Hyperlipidemia with target LDL less than 100 Does not watch diet but she has been walking in her pool daily  4. GAD (generalized anxiety disorder) Is on xanax and only takes 2-3 x a month. She has taken it more lately then she usually does.  5. PANIC DISORDER Has had a few panic attacks since covid started  6. Pulmonary emphysema, unspecified emphysema type ([place]) Doing well. Uses advair and combivent daily. Humidity is what causes her breathing to worsen.  7. Osteopenia, unspecified location Last dexascan was in 2016 with t score of -1.5. She denies any low back pain. She refuses to do any more dexascans.   8. BMI 29.0-29.9,adult Weight is down 2 lbs from previous weight    Outpatient Encounter Medications as of 07/15/2018  Medication Sig  . ADVAIR DISKUS 100-50 MCG/DOSE AEPB INHALE 1 DOSE BY MOUTH TWICE DAILY  . albuterol (PROAIR HFA) 108 (90 Base) MCG/ACT inhaler Inhale 2 puffs into the lungs every 6 (six) hours as needed.  . ALPRAZolam (XANAX) 0.5 MG tablet Take 1 tablet (0.5 mg total) by mouth daily.  . Ipratropium-Albuterol (COMBIVENT) 20-100 MCG/ACT AERS respimat Inhale 1 puff into the lungs every 6 (six) hours.  Marland Kitchen lisinopril-hydrochlorothiazide (PRINZIDE,ZESTORETIC) 10-12.5 MG tablet Take 1 tablet by mouth daily.  . Multiple Vitamin (MULTIVITAMIN WITH MINERALS) TABS tablet Take 1 tablet by mouth daily.  Marland Kitchen omeprazole (PRILOSEC) 20 MG capsule Take 1  capsule (20 mg total) by mouth daily.    Past Surgical History:  Procedure Laterality Date  . TUBAL LIGATION      Family History  Problem Relation Age of Onset  . Asthma Mother   . Hypertension Mother   . Dementia Mother   . Diabetes Father   . COPD Father   . Diabetes Sister   . Hypertension Sister   . Diabetes Brother   . Early death Brother        MVA with quadrapelgic  . Diabetes Brother   . Dementia Brother   . Diabetes Brother   . Cancer Sister        vulva  . Colon cancer Neg Hx     New complaints: None today  Social history: Lives with husband- he is fairly healthy     Review of Systems  Constitutional: Negative for activity change and appetite change.  HENT: Negative.   Eyes: Negative for pain.  Respiratory: Negative for shortness of breath.   Cardiovascular: Negative for chest pain, palpitations and leg swelling.  Gastrointestinal: Negative for abdominal pain.  Endocrine: Negative for polydipsia.  Genitourinary: Negative.   Skin: Negative for rash.  Neurological: Negative for dizziness, weakness and headaches.  Hematological: Does not bruise/bleed easily.  Psychiatric/Behavioral: Negative.   All other systems reviewed and are negative.      Objective:   Physical Exam Vitals signs and nursing note reviewed.  Constitutional:      General: She is not in acute distress.  Appearance: Normal appearance. She is well-developed.  HENT:     Head: Normocephalic.     Nose: Nose normal.  Eyes:     Pupils: Pupils are equal, round, and reactive to light.  Neck:     Musculoskeletal: Normal range of motion and neck supple.     Vascular: No carotid bruit or JVD.  Cardiovascular:     Rate and Rhythm: Normal rate and regular rhythm.     Heart sounds: Normal heart sounds.  Pulmonary:     Effort: Pulmonary effort is normal. No respiratory distress.     Breath sounds: Normal breath sounds. No wheezing or rales.  Chest:     Chest wall: No tenderness.   Abdominal:     General: Bowel sounds are normal. There is no distension or abdominal bruit.     Palpations: Abdomen is soft. There is no hepatomegaly, splenomegaly, mass or pulsatile mass.     Tenderness: There is no abdominal tenderness.  Musculoskeletal: Normal range of motion.  Lymphadenopathy:     Cervical: No cervical adenopathy.  Skin:    General: Skin is warm and dry.  Neurological:     Mental Status: She is alert and oriented to person, place, and time.     Deep Tendon Reflexes: Reflexes are normal and symmetric.  Psychiatric:        Behavior: Behavior normal.        Thought Content: Thought content normal.        Judgment: Judgment normal.    EKG- NSR-Mary-Margaret Hassell Done, FNP Chest x ray- no acute changes- chronic bronchitic changes   BP (!) 157/81   Pulse 68   Temp 97.9 F (36.6 C) (Oral)   Ht 5\' 4"  (1.626 m)   Wt 173 lb (78.5 kg)   BMI 29.70 kg/m      Assessment & Plan:  KATHALENE SPORER comes in today with chief complaint of Medical Management of Chronic Issues (sinus)   Diagnosis and orders addressed:  1. Essential hypertension, benign Low sodium diet - DG Chest 2 View; Future - EKG 12-Lead - lisinopril-hydrochlorothiazide (ZESTORETIC) 10-12.5 MG tablet; Take 1 tablet by mouth daily.  Dispense: 90 tablet; Refill: 1  2. Gastroesophageal reflux disease, esophagitis presence not specified Avoid spicy foods Do not eat 2 hours prior to bedtime - omeprazole (PRILOSEC) 20 MG capsule; Take 1 capsule (20 mg total) by mouth daily.  Dispense: 90 capsule; Refill: 1  3. Hyperlipidemia with target LDL less than 100 Low fat diet and exercise  4. GAD (generalized anxiety disorder) Stress management - ALPRAZolam (XANAX) 0.5 MG tablet; Take 1 tablet (0.5 mg total) by mouth daily.  Dispense: 30 tablet; Refill: 5  5. PANIC DISORDER   6. Osteopenia, unspecified location Weight bearing exercise encouraged  7. BMI 29.0-29.9,adult Discussed diet and exercise for  person with BMI >25 Will recheck weight in 3-6 months  8. EMPHYSEMA Avoid being out in humidity - ADVAIR DISKUS 100-50 MCG/DOSE AEPB; INHALE 1 DOSE BY MOUTH TWICE DAILY  Dispense: 180 each; Refill: 1 - Ipratropium-Albuterol (COMBIVENT) 20-100 MCG/ACT AERS respimat; Inhale 1 puff into the lungs every 6 (six) hours.  Dispense: 12 g; Refill: 1   Labs pending Health Maintenance reviewed Diet and exercise encouraged  Follow up plan: 6 months   [place], FNP

## 2018-10-15 ENCOUNTER — Other Ambulatory Visit (HOSPITAL_COMMUNITY): Payer: Self-pay | Admitting: Nurse Practitioner

## 2018-10-15 DIAGNOSIS — Z1231 Encounter for screening mammogram for malignant neoplasm of breast: Secondary | ICD-10-CM

## 2018-10-25 ENCOUNTER — Encounter: Payer: Self-pay | Admitting: *Deleted

## 2018-11-13 ENCOUNTER — Ambulatory Visit (HOSPITAL_COMMUNITY)
Admission: RE | Admit: 2018-11-13 | Discharge: 2018-11-13 | Disposition: A | Discharge status: Home / Self Care | Payer: PPO | Source: Ambulatory Visit | Attending: Nurse Practitioner | Admitting: Nurse Practitioner

## 2018-11-13 ENCOUNTER — Other Ambulatory Visit: Payer: Self-pay

## 2018-11-13 DIAGNOSIS — Z1231 Encounter for screening mammogram for malignant neoplasm of breast: Secondary | ICD-10-CM

## 2018-11-21 ENCOUNTER — Telehealth: Payer: Self-pay | Admitting: Nurse Practitioner

## 2018-11-21 ENCOUNTER — Other Ambulatory Visit: Payer: Self-pay

## 2018-11-22 ENCOUNTER — Ambulatory Visit (INDEPENDENT_AMBULATORY_CARE_PROVIDER_SITE_OTHER): Payer: PPO

## 2018-11-22 DIAGNOSIS — Z23 Encounter for immunization: Secondary | ICD-10-CM | POA: Diagnosis not present

## 2019-01-06 ENCOUNTER — Other Ambulatory Visit: Payer: Self-pay

## 2019-01-07 ENCOUNTER — Encounter: Payer: Self-pay | Admitting: Nurse Practitioner

## 2019-01-07 ENCOUNTER — Ambulatory Visit (INDEPENDENT_AMBULATORY_CARE_PROVIDER_SITE_OTHER): Payer: PPO | Admitting: Nurse Practitioner

## 2019-01-07 VITALS — BP 148/68 | HR 62 | Temp 98.0°F | Resp 20 | Ht 64.0 in | Wt 176.0 lb

## 2019-01-07 DIAGNOSIS — J438 Other emphysema: Secondary | ICD-10-CM | POA: Diagnosis not present

## 2019-01-07 DIAGNOSIS — K219 Gastro-esophageal reflux disease without esophagitis: Secondary | ICD-10-CM

## 2019-01-07 DIAGNOSIS — Z6829 Body mass index (BMI) 29.0-29.9, adult: Secondary | ICD-10-CM | POA: Diagnosis not present

## 2019-01-07 DIAGNOSIS — F411 Generalized anxiety disorder: Secondary | ICD-10-CM | POA: Diagnosis not present

## 2019-01-07 DIAGNOSIS — E785 Hyperlipidemia, unspecified: Secondary | ICD-10-CM

## 2019-01-07 DIAGNOSIS — J439 Emphysema, unspecified: Secondary | ICD-10-CM

## 2019-01-07 DIAGNOSIS — F410 Panic disorder [episodic paroxysmal anxiety] without agoraphobia: Secondary | ICD-10-CM | POA: Diagnosis not present

## 2019-01-07 DIAGNOSIS — I10 Essential (primary) hypertension: Secondary | ICD-10-CM

## 2019-01-07 DIAGNOSIS — K5732 Diverticulitis of large intestine without perforation or abscess without bleeding: Secondary | ICD-10-CM

## 2019-01-07 MED ORDER — ADVAIR DISKUS 100-50 MCG/DOSE IN AEPB: INHALATION_SPRAY | RESPIRATORY_TRACT | 1 refills | Status: DC

## 2019-01-07 MED ORDER — CIPROFLOXACIN HCL 500 MG PO TABS: 500.0000 mg | ORAL_TABLET | Freq: Two times a day (BID) | ORAL | 0 refills | Status: DC

## 2019-01-07 MED ORDER — IPRATROPIUM-ALBUTEROL 20-100 MCG/ACT IN AERS: 1.0000 | INHALATION_SPRAY | Freq: Four times a day (QID) | RESPIRATORY_TRACT | 1 refills | Status: DC

## 2019-01-07 MED ORDER — OMEPRAZOLE 20 MG PO CPDR: 20.0000 mg | DELAYED_RELEASE_CAPSULE | Freq: Every day | ORAL | 1 refills | Status: DC

## 2019-01-07 MED ORDER — METRONIDAZOLE 500 MG PO TABS: 500.0000 mg | ORAL_TABLET | Freq: Two times a day (BID) | ORAL | 0 refills | Status: DC

## 2019-01-07 MED ORDER — LISINOPRIL-HYDROCHLOROTHIAZIDE 10-12.5 MG PO TABS: 1.0000 | ORAL_TABLET | Freq: Every day | ORAL | 1 refills | Status: DC

## 2019-01-07 MED ORDER — ALPRAZOLAM 0.5 MG PO TABS: 0.5000 mg | ORAL_TABLET | Freq: Every day | ORAL | 5 refills | Status: DC

## 2019-01-07 NOTE — Progress Notes (Signed)
Subjective:    Patient ID: Gabriela Wilson, female    DOB: 12-04-1945, 73 y.o.   MRN: [id number]   Chief Complaint: Medical Management of Chronic Issues    HPI:  1. Essential hypertension, benign No c/o chest pain, sob or headaches. Does check blood pressure at home and runs around 681-275 systolic. BP Readings from Last 3 Encounters:  01/07/19 (!) 148/68  07/15/18 (!) 157/81  01/11/18 140/60     2. Hyperlipidemia with target LDL less than 100 She tries to avoid fried foods. Does not exercise at all. Lab Results  Component Value Date   CHOL 182 07/15/2018   HDL 52 07/15/2018   LDLCALC 107 (H) 07/15/2018   TRIG 116 07/15/2018   CHOLHDL 3.5 07/15/2018     3. Pulmonary emphysema, unspecified emphysema type ([place]) Gets so if she goes out in the cold weather  4. Gastroesophageal reflux disease, unspecified whether esophagitis present Takes omeprazole daily and works well to keep symptoms under control  5. GAD (generalized anxiety disorder) Is on xanax dialy prn. Usually takes no more then 1/2 tablet daily. GAD 7 : Generalized Anxiety Score 01/07/2019  Nervous, Anxious, on Edge 0  Control/stop worrying 0  Worry too much - different things 0  Trouble relaxing 0  Restless 0  Easily annoyed or irritable 0  Afraid - awful might happen 0  Total GAD 7 Score 0  Anxiety Difficulty Not difficult at all      6. PANIC DISORDER Has occasional panic attacks and that is when sh etakes her xanax.  7. BMI 29.0-29.9,adult No recent weight changes Wt Readings from Last 3 Encounters:  01/07/19 176 lb (79.8 kg)  07/15/18 173 lb (78.5 kg)  01/11/18 175 lb (79.4 kg)    BMI Readings from Last 3 Encounters:  01/07/19 30.21 kg/m  07/15/18 29.70 kg/m  01/11/18 30.04 kg/m      Outpatient Encounter Medications as of 01/07/2019  Medication Sig  . ADVAIR DISKUS 100-50 MCG/DOSE AEPB INHALE 1 DOSE BY MOUTH TWICE DAILY  . albuterol (PROAIR HFA) 108 (90 Base) MCG/ACT inhaler  Inhale 2 puffs into the lungs every 6 (six) hours as needed.  . ALPRAZolam (XANAX) 0.5 MG tablet Take 1 tablet (0.5 mg total) by mouth daily.  . Ipratropium-Albuterol (COMBIVENT) 20-100 MCG/ACT AERS respimat Inhale 1 puff into the lungs every 6 (six) hours.  Marland Kitchen lisinopril-hydrochlorothiazide (ZESTORETIC) 10-12.5 MG tablet Take 1 tablet by mouth daily.  . Multiple Vitamin (MULTIVITAMIN WITH MINERALS) TABS tablet Take 1 tablet by mouth daily.  Marland Kitchen omeprazole (PRILOSEC) 20 MG capsule Take 1 capsule (20 mg total) by mouth daily.     Past Surgical History:  Procedure Laterality Date  . TUBAL LIGATION      Family History  Problem Relation Age of Onset  . Asthma Mother   . Hypertension Mother   . Dementia Mother   . Diabetes Father   . COPD Father   . Diabetes Sister   . Hypertension Sister   . Diabetes Brother   . Early death Brother        MVA with quadrapelgic  . Diabetes Brother   . Dementia Brother   . Diabetes Brother   . Cancer Sister        vulva  . Colon cancer Neg Hx     New complaints: Having right upper quadrant pain. This is usually where her diverticuitis flares up. Stated hurting a couple of weeks ago.  Social history: Lives with her husband  Controlled substance contract: 01/07/19    Review of Systems  Constitutional: Negative for diaphoresis.  Eyes: Negative for pain.  Respiratory: Negative for shortness of breath.   Cardiovascular: Negative for chest pain, palpitations and leg swelling.  Gastrointestinal: Positive for abdominal pain (right side). Negative for constipation and diarrhea.  Endocrine: Negative for polydipsia.  Skin: Negative for rash.  Neurological: Negative for dizziness, weakness and headaches.  Hematological: Does not bruise/bleed easily.  All other systems reviewed and are negative.      Objective:   Physical Exam Vitals and nursing note reviewed.  Constitutional:      General: She is not in acute distress.    Appearance: Normal  appearance. She is well-developed.  HENT:     Head: Normocephalic.     Nose: Nose normal.  Eyes:     Pupils: Pupils are equal, round, and reactive to light.  Neck:     Vascular: No carotid bruit or JVD.  Cardiovascular:     Rate and Rhythm: Normal rate and regular rhythm.     Heart sounds: Normal heart sounds.  Pulmonary:     Effort: Pulmonary effort is normal. No respiratory distress.     Breath sounds: Normal breath sounds. No wheezing or rales.  Chest:     Chest wall: No tenderness.  Abdominal:     General: Bowel sounds are normal. There is no distension or abdominal bruit.     Palpations: Abdomen is soft. There is no hepatomegaly, splenomegaly, mass or pulsatile mass.     Tenderness: There is abdominal tenderness (right side).  Musculoskeletal:        General: Normal range of motion.     Cervical back: Normal range of motion and neck supple.  Lymphadenopathy:     Cervical: No cervical adenopathy.  Skin:    General: Skin is warm and dry.  Neurological:     Mental Status: She is alert and oriented to person, place, and time.     Deep Tendon Reflexes: Reflexes are normal and symmetric.  Psychiatric:        Behavior: Behavior normal.        Thought Content: Thought content normal.        Judgment: Judgment normal.    BP (!) 148/68   Pulse 62   Temp 98 F (36.7 C) (Temporal)   Resp 20   Ht 5' 4"  (1.626 m)   Wt 176 lb (79.8 kg)   SpO2 99%   BMI 30.21 kg/m        Assessment & Plan:  Gabriela Wilson comes in today with chief complaint of Medical Management of Chronic Issues   Diagnosis and orders addressed:  1. Essential hypertension, benign Low sodium diet - lisinopril-hydrochlorothiazide (ZESTORETIC) 10-12.5 MG tablet; Take 1 tablet by mouth daily.  Dispense: 90 tablet; Refill: 1 - CMP14+EGFR  2. Hyperlipidemia with target LDL less than 100 Low fat diet - Lipid panel  3. Pulmonary emphysema, unspecified emphysema type ([place]) Continue inhalers as rx  4.  Gastroesophageal reflux disease Avoid spicy foods Do not eat 2 hours prior to bedtime - omeprazole (PRILOSEC) 20 MG capsule; Take 1 capsule (20 mg total) by mouth daily.  Dispense: 90 capsule; Refill: 1   5. GAD (generalized anxiety disorder) strress management - ALPRAZolam (XANAX) 0.5 MG tablet; Take 1 tablet (0.5 mg total) by mouth daily.  Dispense: 30 tablet; Refill: 5  6. PANIC DISORDER  7. BMI 29.0-29.9,adult Discussed diet and exercise for person with BMI >25 Will  recheck weight in 3-6 months  8. Diverticulitis of large intestine without perforation or abscess without bleeding Avoid food s with small seeds or indigestable skin - metroNIDAZOLE (FLAGYL) 500 MG tablet; Take 1 tablet (500 mg total) by mouth 2 (two) times daily.  Dispense: 20 tablet; Refill: 0 - ciprofloxacin (CIPRO) 500 MG tablet; Take 1 tablet (500 mg total) by mouth 2 (two) times daily.  Dispense: 20 tablet; Refill: 0  9. EMPHYSEMA - Ipratropium-Albuterol (COMBIVENT) 20-100 MCG/ACT AERS respimat; Inhale 1 puff into the lungs every 6 (six) hours.  Dispense: 12 g; Refill: 1 - ADVAIR DISKUS 100-50 MCG/DOSE AEPB; INHALE 1 DOSE BY MOUTH TWICE DAILY  Dispense: 180 each; Refill: 1    Labs pending Health Maintenance reviewed Diet and exercise encouraged  Follow up plan: 6 months   Mary-Margaret Hassell Done, FNP

## 2019-01-07 NOTE — Patient Instructions (Signed)
Diverticulosis  Diverticulosis is a condition that develops when small pouches (diverticula) form in the wall of the large intestine (colon). The colon is where water is absorbed and stool is formed. The pouches form when the inside layer of the colon pushes through weak spots in the outer layers of the colon. You may have a few pouches or many of them. What are the causes? The cause of this condition is not known. What increases the risk? The following factors may make you more likely to develop this condition:  Being older than age 60. Your risk for this condition increases with age. Diverticulosis is rare among people younger than age 30. By age 80, many people have it.  Eating a low-fiber diet.  Having frequent constipation.  Being overweight.  Not getting enough exercise.  Smoking.  Taking over-the-counter pain medicines, like aspirin and ibuprofen.  Having a family history of diverticulosis. What are the signs or symptoms? In most people, there are no symptoms of this condition. If you do have symptoms, they may include:  Bloating.  Cramps in the abdomen.  Constipation or diarrhea.  Pain in the lower left side of the abdomen. How is this diagnosed? This condition is most often diagnosed during an exam for other colon problems. Because diverticulosis usually has no symptoms, it often cannot be diagnosed independently. This condition may be diagnosed by:  Using a flexible scope to examine the colon (colonoscopy).  Taking an X-ray of the colon after dye has been put into the colon (barium enema).  Doing a CT scan. How is this treated? You may not need treatment for this condition if you have never developed an infection related to diverticulosis. If you have had an infection before, treatment may include:  Eating a high-fiber diet. This may include eating more fruits, vegetables, and grains.  Taking a fiber supplement.  Taking a live bacteria supplement (probiotic).   Taking medicine to relax your colon.  Taking antibiotic medicines. Follow these instructions at home:  Drink 6-8 glasses of water or more each day to prevent constipation.  Try not to strain when you have a bowel movement.  If you have had an infection before: ? Eat more fiber as directed by your health care provider or your diet and nutrition specialist (dietitian). ? Take a fiber supplement or probiotic, if your health care provider approves.  Take over-the-counter and prescription medicines only as told by your health care provider.  If you were prescribed an antibiotic, take it as told by your health care provider. Do not stop taking the antibiotic even if you start to feel better.  Keep all follow-up visits as told by your health care provider. This is important. Contact a health care provider if:  You have pain in your abdomen.  You have bloating.  You have cramps.  You have not had a bowel movement in 3 days. Get help right away if:  Your pain gets worse.  Your bloating becomes very bad.  You have a fever or chills, and your symptoms suddenly get worse.  You vomit.  You have bowel movements that are bloody or black.  You have bleeding from your rectum. Summary  Diverticulosis is a condition that develops when small pouches (diverticula) form in the wall of the large intestine (colon).  You may have a few pouches or many of them.  This condition is most often diagnosed during an exam for other colon problems.  If you have had an infection related to   diverticulosis, treatment may include increasing the fiber in your diet, taking supplements, or taking medicines. This information is not intended to replace advice given to you by your health care provider. Make sure you discuss any questions you have with your health care provider. Document Released: 10/07/2003 Document Revised: 12/22/2016 Document Reviewed: 11/29/2015 Elsevier Patient Education  2020  Elsevier Inc.  

## 2019-01-08 LAB — LIPID PANEL
Chol/HDL Ratio: 4.1 ratio (ref 0.0–4.4)
Cholesterol, Total: 204 mg/dL — ABNORMAL HIGH (ref 100–199)
HDL: 50 mg/dL (ref >39)
LDL Chol Calc (NIH): 129 mg/dL — ABNORMAL HIGH (ref 0–99)
Triglycerides: 143 mg/dL (ref 0–149)
VLDL Cholesterol Cal: 25 mg/dL (ref 5–40)

## 2019-01-08 LAB — CMP14+EGFR
ALT: 24 IU/L (ref 0–32)
AST: 23 IU/L (ref 0–40)
Albumin/Globulin Ratio: 2.4 — ABNORMAL HIGH (ref 1.2–2.2)
Albumin: 4.7 g/dL (ref 3.7–4.7)
Alkaline Phosphatase: 53 IU/L (ref 39–117)
BUN/Creatinine Ratio: 15 (ref 12–28)
BUN: 14 mg/dL (ref 8–27)
Bilirubin Total: 0.6 mg/dL (ref 0.0–1.2)
CO2: 21 mmol/L (ref 20–29)
Calcium: 10 mg/dL (ref 8.7–10.3)
Chloride: 103 mmol/L (ref 96–106)
Creatinine, Ser: 0.96 mg/dL (ref 0.57–1.00)
GFR calc Af Amer: 68 mL/min/{1.73_m2} (ref >59)
GFR calc non Af Amer: 59 mL/min/{1.73_m2} — ABNORMAL LOW (ref >59)
Globulin, Total: 2 g/dL (ref 1.5–4.5)
Glucose: 108 mg/dL — ABNORMAL HIGH (ref 65–99)
Potassium: 4.5 mmol/L (ref 3.5–5.2)
Sodium: 142 mmol/L (ref 134–144)
Total Protein: 6.7 g/dL (ref 6.0–8.5)

## 2019-01-14 ENCOUNTER — Ambulatory Visit: Payer: Self-pay | Admitting: Nurse Practitioner

## 2019-04-22 ENCOUNTER — Telehealth: Payer: Self-pay | Admitting: Nurse Practitioner

## 2019-04-22 NOTE — Chronic Care Management (AMB) (Signed)
  Chronic Care Management   Outreach Note  04/22/2019 Name: Gabriela Wilson MRN: [id number] DOB: 01/04/1946  Gabriela Wilson is a 74 y.o. year old female who is a primary care patient of Chevis Pretty, [place]. I reached out to [name] by phone today in response to a referral sent by [name]'s health plan.     An unsuccessful telephone outreach was attempted today. The patient was referred to the case management team for assistance with care management and care coordination.   Follow Up Plan: The care management team will reach out to the patient again over the next 7 days.  If patient returns call to provider office, please advise to call [facility] at [address], [place], [place],  [address] Direct Dial: [phone] [email or web address] Website: [email or web address]

## 2019-05-01 NOTE — Chronic Care Management (AMB) (Signed)
  Chronic Care Management   Outreach Note  05/01/2019 Name: Gabriela Wilson MRN: [id number] DOB: 1945/10/13  Gabriela Wilson is a 74 y.o. year old female who is a primary care patient of Chevis Pretty, [place]. I reached out to Gabriela Wilson by phone today in response to a referral sent by Gabriela Wilson's health plan.     A second unsuccessful telephone outreach was attempted today. The patient was referred to the case management team for assistance with care management and care coordination.   Follow Up Plan: A HIPPA compliant phone message was left for the patient providing contact information and requesting a return call.  The care management team will reach out to the patient again over the next 7 days.  If patient returns call to provider office, please advise to call [facility]  at [place], [place], [place], [place] [address] Direct Dial: [phone] [email or web address] Website: [email or web address]

## 2019-05-05 NOTE — Chronic Care Management (AMB) (Signed)
  Chronic Care Management   Note  05/05/2019 Name: Gabriela Wilson MRN: [id number] DOB: [birth year]  GLORIANA PILTZ is a 74 y.o. year old female who is a primary care patient of Chevis Pretty, [place]. I reached out to [name] by phone today in response to a referral sent by Ms. Lucita Lora Jernberg's health plan.     Ms. Lockner was given information about Chronic Care Management services today including:  1. CCM service includes personalized support from designated clinical staff supervised by her physician, including individualized plan of care and coordination with other care providers 2. 24/7 contact phone numbers for assistance for urgent and routine care needs. 3. Service will only be billed when office clinical staff spend 20 minutes or more in a month to coordinate care. 4. Only one practitioner may furnish and bill the service in a calendar month. 5. The patient may stop CCM services at any time (effective at the end of the month) by phone call to the office staff. 6. The patient will be responsible for cost sharing (co-pay) of up to 20% of the service fee (after annual deductible is met).  Patient did not agree to enrollment in care management services and does not wish to consider at this time.  Follow up plan: The patient has been provided with contact information for the care management team and has been advised to call with any health related questions or concerns.   Noreene Larsson, [place], [place], [place] [address] Direct Dial: [phone] [email or web address] Website: [email or web address]

## 2019-06-25 ENCOUNTER — Encounter: Payer: Self-pay | Admitting: Family Medicine

## 2019-06-25 ENCOUNTER — Other Ambulatory Visit: Payer: Self-pay

## 2019-06-25 ENCOUNTER — Ambulatory Visit (INDEPENDENT_AMBULATORY_CARE_PROVIDER_SITE_OTHER): Payer: PPO | Admitting: Family Medicine

## 2019-06-25 DIAGNOSIS — J441 Chronic obstructive pulmonary disease with (acute) exacerbation: Secondary | ICD-10-CM

## 2019-06-25 MED ORDER — AMOXICILLIN-POT CLAVULANATE 875-125 MG PO TABS: 1.0000 | ORAL_TABLET | Freq: Two times a day (BID) | ORAL | 0 refills | Status: DC

## 2019-06-25 MED ORDER — PREDNISONE 10 MG PO TABS: ORAL_TABLET | ORAL | 0 refills | Status: DC

## 2019-06-25 NOTE — Progress Notes (Signed)
Subjective:    Patient ID: Gabriela Wilson, female    DOB: 1945/04/15, 74 y.o.   MRN: [id number]   HPI: Gabriela Wilson is a 74 y.o. female presenting for COPD with URI. Has been using OTC stuff to make it better, but not working. Using inhalers wih no improvement. Cough is dry. Rattling in chest. Wheezing as well. Has had CoVID shots. Denies dyspnea. Denies fever.   Depression screen [facility] 2/9 01/07/2019 07/15/2018 01/11/2018 07/12/2017 05/02/2017  Decreased Interest 0 0 0 0 0  Down, Depressed, Hopeless 0 0 0 0 0  PHQ - 2 Score 0 0 0 0 0     Relevant past medical, surgical, family and social history reviewed and updated as indicated.  Interim medical history since our last visit reviewed. Allergies and medications reviewed and updated.  ROS:  Review of Systems  Constitutional: Negative for fever.  HENT: Positive for congestion, postnasal drip and sinus pressure.   Respiratory: Positive for cough and wheezing. Negative for shortness of breath.      Social History   Tobacco Use  Smoking Status Former Smoker  . Quit date: 01/24/1992  . Years since quitting: 27.4  Smokeless Tobacco Never Used       Objective:     Wt Readings from Last 3 Encounters:  01/07/19 176 lb (79.8 kg)  07/15/18 173 lb (78.5 kg)  01/11/18 175 lb (79.4 kg)     Exam deferred. Pt. [place] due to COVID 19. Phone visit performed.   Assessment & Plan:   1. Acute exacerbation of chronic obstructive pulmonary disease (COPD) ([place])     Meds ordered this encounter  Medications  . amoxicillin-clavulanate (AUGMENTIN) 875-125 MG tablet    Sig: Take 1 tablet by mouth 2 (two) times daily. Take all of this medication    Dispense:  20 tablet    Refill:  0  . predniSONE (DELTASONE) 10 MG tablet    Sig: Take 5 daily for 2 days followed by 4,3,2 and 1 for 2 days each.    Dispense:  30 tablet    Refill:  0    No orders of the defined types were placed in this encounter.     Diagnoses and all orders for  this visit:  Acute exacerbation of chronic obstructive pulmonary disease (COPD) ([place])  Other orders -     amoxicillin-clavulanate (AUGMENTIN) 875-125 MG tablet; Take 1 tablet by mouth 2 (two) times daily. Take all of this medication -     predniSONE (DELTASONE) 10 MG tablet; Take 5 daily for 2 days followed by 4,3,2 and 1 for 2 days each.    Virtual Visit via telephone Note  I discussed the limitations, risks, security and privacy concerns of performing an evaluation and management service by telephone and the availability of in person appointments. The patient was identified with two identifiers. Pt.expressed understanding and agreed to proceed. Pt. Is at home. Dr. Livia Snellen is in his office.  Follow Up Instructions:   I discussed the assessment and treatment plan with the patient. The patient was provided an opportunity to ask questions and all were answered. The patient agreed with the plan and demonstrated an understanding of the instructions.   The patient was advised to call back or seek an in-person evaluation if the symptoms worsen or if the condition fails to improve as anticipated.   Total minutes including chart review and phone contact time: 14   Follow up plan: Return if symptoms worsen or fail  to improve.  Claretta Fraise, MD [place]

## 2019-07-08 ENCOUNTER — Encounter: Payer: Self-pay | Admitting: Nurse Practitioner

## 2019-07-08 ENCOUNTER — Other Ambulatory Visit: Payer: Self-pay

## 2019-07-08 ENCOUNTER — Ambulatory Visit (INDEPENDENT_AMBULATORY_CARE_PROVIDER_SITE_OTHER): Payer: PPO | Admitting: Nurse Practitioner

## 2019-07-08 VITALS — BP 160/66 | HR 65 | Temp 97.7°F | Resp 20 | Ht 64.0 in | Wt 170.0 lb

## 2019-07-08 DIAGNOSIS — M8580 Other specified disorders of bone density and structure, unspecified site: Secondary | ICD-10-CM | POA: Diagnosis not present

## 2019-07-08 DIAGNOSIS — J438 Other emphysema: Secondary | ICD-10-CM

## 2019-07-08 DIAGNOSIS — F411 Generalized anxiety disorder: Secondary | ICD-10-CM | POA: Diagnosis not present

## 2019-07-08 DIAGNOSIS — J439 Emphysema, unspecified: Secondary | ICD-10-CM | POA: Diagnosis not present

## 2019-07-08 DIAGNOSIS — Z6829 Body mass index (BMI) 29.0-29.9, adult: Secondary | ICD-10-CM

## 2019-07-08 DIAGNOSIS — I10 Essential (primary) hypertension: Secondary | ICD-10-CM

## 2019-07-08 DIAGNOSIS — F410 Panic disorder [episodic paroxysmal anxiety] without agoraphobia: Secondary | ICD-10-CM | POA: Diagnosis not present

## 2019-07-08 DIAGNOSIS — K219 Gastro-esophageal reflux disease without esophagitis: Secondary | ICD-10-CM | POA: Diagnosis not present

## 2019-07-08 DIAGNOSIS — E785 Hyperlipidemia, unspecified: Secondary | ICD-10-CM | POA: Diagnosis not present

## 2019-07-08 LAB — CBC WITH DIFFERENTIAL/PLATELET
Basophils Absolute: 0.1 10*3/uL (ref 0.0–0.2)
Basos: 1 %
EOS (ABSOLUTE): 0.4 10*3/uL (ref 0.0–0.4)
Eos: 6 %
Hematocrit: 43.2 % (ref 34.0–46.6)
Hemoglobin: 14.4 g/dL (ref 11.1–15.9)
Immature Grans (Abs): 0 10*3/uL (ref 0.0–0.1)
Immature Granulocytes: 0 %
Lymphocytes Absolute: 1.5 10*3/uL (ref 0.7–3.1)
Lymphs: 22 %
MCH: 31.1 pg (ref 26.6–33.0)
MCHC: 33.3 g/dL (ref 31.5–35.7)
MCV: 93 fL (ref 79–97)
Monocytes Absolute: 0.6 10*3/uL (ref 0.1–0.9)
Monocytes: 8 %
Neutrophils Absolute: 4.3 10*3/uL (ref 1.4–7.0)
Neutrophils: 63 %
Platelets: 232 10*3/uL (ref 150–450)
RBC: 4.63 x10E6/uL (ref 3.77–5.28)
RDW: 13.6 % (ref 11.7–15.4)
WBC: 6.9 10*3/uL (ref 3.4–10.8)

## 2019-07-08 LAB — CMP14+EGFR
ALT: 21 IU/L (ref 0–32)
AST: 16 IU/L (ref 0–40)
Albumin/Globulin Ratio: 2.4 — ABNORMAL HIGH (ref 1.2–2.2)
Albumin: 4.4 g/dL (ref 3.7–4.7)
Alkaline Phosphatase: 51 IU/L (ref 48–121)
BUN/Creatinine Ratio: 14 (ref 12–28)
BUN: 13 mg/dL (ref 8–27)
Bilirubin Total: 0.7 mg/dL (ref 0.0–1.2)
CO2: 25 mmol/L (ref 20–29)
Calcium: 9.8 mg/dL (ref 8.7–10.3)
Chloride: 100 mmol/L (ref 96–106)
Creatinine, Ser: 0.93 mg/dL (ref 0.57–1.00)
GFR calc Af Amer: 71 mL/min/{1.73_m2} (ref >59)
GFR calc non Af Amer: 61 mL/min/{1.73_m2} (ref >59)
Globulin, Total: 1.8 g/dL (ref 1.5–4.5)
Glucose: 96 mg/dL (ref 65–99)
Potassium: 4.1 mmol/L (ref 3.5–5.2)
Sodium: 140 mmol/L (ref 134–144)
Total Protein: 6.2 g/dL (ref 6.0–8.5)

## 2019-07-08 LAB — LIPID PANEL
Chol/HDL Ratio: 4.3 ratio (ref 0.0–4.4)
Cholesterol, Total: 183 mg/dL (ref 100–199)
HDL: 43 mg/dL (ref >39)
LDL Chol Calc (NIH): 107 mg/dL — ABNORMAL HIGH (ref 0–99)
Triglycerides: 187 mg/dL — ABNORMAL HIGH (ref 0–149)
VLDL Cholesterol Cal: 33 mg/dL (ref 5–40)

## 2019-07-08 MED ORDER — ADVAIR DISKUS 100-50 MCG/DOSE IN AEPB: INHALATION_SPRAY | RESPIRATORY_TRACT | 1 refills | Status: DC

## 2019-07-08 MED ORDER — ALPRAZOLAM 0.5 MG PO TABS: 0.5000 mg | ORAL_TABLET | Freq: Every day | ORAL | 5 refills | Status: DC

## 2019-07-08 MED ORDER — LISINOPRIL-HYDROCHLOROTHIAZIDE 20-12.5 MG PO TABS: 1.0000 | ORAL_TABLET | Freq: Every day | ORAL | 1 refills | Status: DC

## 2019-07-08 MED ORDER — IPRATROPIUM-ALBUTEROL 20-100 MCG/ACT IN AERS: 1.0000 | INHALATION_SPRAY | Freq: Four times a day (QID) | RESPIRATORY_TRACT | 1 refills | Status: DC

## 2019-07-08 MED ORDER — OMEPRAZOLE 20 MG PO CPDR: 20.0000 mg | DELAYED_RELEASE_CAPSULE | Freq: Every day | ORAL | 1 refills | Status: DC

## 2019-07-08 NOTE — Patient Instructions (Signed)

## 2019-07-08 NOTE — Progress Notes (Addendum)
Subjective:    Patient ID: Gabriela Wilson, female    DOB: November 06, 1945, 74 y.o.   MRN: [id number]   Chief Complaint: Medical Management of Chronic Issues    HPI:  1. Essential hypertension, benign No c/o chest pain, sob or headache. Does  check blood pressure at home and runs in 140"s consistenetly BP Readings from Last 3 Encounters:  07/08/19 (!) 160/66  01/07/19 (!) 148/68  07/15/18 (!) 157/81     2. Hyperlipidemia with target LDL less than 100 Doe sot watch diet and does no dedicated exercise. Refuses to take statin. Lab Results  Component Value Date   CHOL 204 (H) 01/07/2019   HDL 50 01/07/2019   LDLCALC 129 (H) 01/07/2019   TRIG 143 01/07/2019   CHOLHDL 4.1 01/07/2019     3. Pulmonary emphysema, unspecified emphysema type ([place]) Has chronic cough. Uses combivent and advair. has been using albuterol , because she had URI. Only had SOB while she was sick.  4. Gastroesophageal reflux disease, unspecified whether esophagitis present Is on omeprazole daily and works well to keep symptoms under control.  5. GAD (generalized anxiety disorder) Is on xanax as needed. Has need ed them lately since she was sick.  6. PANIC DISORDER Had a panic attack last week while sick because she was having trouble breathing and sleeping.  7. Osteopenia, unspecified location Last dexascan was in 2016. Her t score was -1.5. takes vitamin d and calcium daily.  8. BMI 29.0-29.9,adult Weight is down 6lbs Wt Readings from Last 3 Encounters:  07/08/19 170 lb (77.1 kg)  01/07/19 176 lb (79.8 kg)  07/15/18 173 lb (78.5 kg)   BMI Readings from Last 3 Encounters:  07/08/19 29.18 kg/m  01/07/19 30.21 kg/m  07/15/18 29.70 kg/m        Outpatient Encounter Medications as of 07/08/2019  Medication Sig  . ADVAIR DISKUS 100-50 MCG/DOSE AEPB INHALE 1 DOSE BY MOUTH TWICE DAILY  . albuterol (PROAIR HFA) 108 (90 Base) MCG/ACT inhaler Inhale 2 puffs into the lungs every 6 (six) hours as  needed.  . ALPRAZolam (XANAX) 0.5 MG tablet Take 1 tablet (0.5 mg total) by mouth daily.  . Ipratropium-Albuterol (COMBIVENT) 20-100 MCG/ACT AERS respimat Inhale 1 puff into the lungs every 6 (six) hours.  Marland Kitchen lisinopril-hydrochlorothiazide (ZESTORETIC) 10-12.5 MG tablet Take 1 tablet by mouth daily.  . Multiple Vitamin (MULTIVITAMIN WITH MINERALS) TABS tablet Take 1 tablet by mouth daily.  Marland Kitchen omeprazole (PRILOSEC) 20 MG capsule Take 1 capsule (20 mg total) by mouth daily.     Past Surgical History:  Procedure Laterality Date  . TUBAL LIGATION      Family History  Problem Relation Age of Onset  . Asthma Mother   . Hypertension Mother   . Dementia Mother   . Diabetes Father   . COPD Father   . Diabetes Sister   . Hypertension Sister   . Diabetes Brother   . Early death Brother        MVA with quadrapelgic  . Diabetes Brother   . Dementia Brother   . Diabetes Brother   . Cancer Sister        vulva  . Colon cancer Neg Hx     New complaints: None today  Social history: Lives with husband  Controlled substance contract: n/a    Review of Systems  Constitutional: Negative for diaphoresis.  Eyes: Negative for pain.  Respiratory: Negative for shortness of breath.   Cardiovascular: Negative for chest pain, palpitations  and leg swelling.  Gastrointestinal: Negative for abdominal pain.  Endocrine: Negative for polydipsia.  Skin: Negative for rash.  Neurological: Negative for dizziness, weakness and headaches.  Hematological: Does not bruise/bleed easily.  All other systems reviewed and are negative.      Objective:   Physical Exam Vitals and nursing note reviewed.  Constitutional:      General: She is not in acute distress.    Appearance: Normal appearance. She is well-developed.  HENT:     Head: Normocephalic.     Nose: Nose normal.  Eyes:     Pupils: Pupils are equal, round, and reactive to light.  Neck:     Vascular: No carotid bruit or JVD.  Cardiovascular:      Rate and Rhythm: Normal rate and regular rhythm.     Heart sounds: Normal heart sounds.  Pulmonary:     Effort: Pulmonary effort is normal. No respiratory distress.     Breath sounds: Normal breath sounds. No wheezing or rales.  Chest:     Chest wall: No tenderness.  Abdominal:     General: Bowel sounds are normal. There is no distension or abdominal bruit.     Palpations: Abdomen is soft. There is no hepatomegaly, splenomegaly, mass or pulsatile mass.     Tenderness: There is no abdominal tenderness.  Musculoskeletal:        General: Normal range of motion.     Cervical back: Normal range of motion and neck supple.  Lymphadenopathy:     Cervical: No cervical adenopathy.  Skin:    General: Skin is warm and dry.  Neurological:     Mental Status: She is alert and oriented to person, place, and time.     Deep Tendon Reflexes: Reflexes are normal and symmetric.  Psychiatric:        Behavior: Behavior normal.        Thought Content: Thought content normal.        Judgment: Judgment normal.     BP (!) 160/66   Pulse 65   Temp 97.7 F (36.5 C) (Temporal)   Resp 20   Ht 5\' 4"  (1.626 m)   Wt 170 lb (77.1 kg)   SpO2 99%   BMI 29.18 kg/m         Assessment & Plan:  Gabriela Wilson comes in today with chief complaint of Medical Management of Chronic Issues   Diagnosis and orders addressed:  1. Essential hypertension, benign Low sodium diet Increase lisinopril to 20/12.5 daily - lisinopril-hydrochlorothiazide (ZESTORETIC) 20-12.5 MG tablet; Take 1 tablet by mouth daily.  Dispense: 90 tablet; Refill: 1  2. Hyperlipidemia with target LDL less than 100 Low fat diet  3. Pulmonary emphysema, unspecified emphysema type ([place]) Continue inhalers  4. Gastroesophageal reflux disease, unspecified whether esophagitis present Avoid spicy foods Do not eat 2 hours prior to bedtime - omeprazole (PRILOSEC) 20 MG capsule; Take 1 capsule (20 mg total) by mouth daily.  Dispense: 90  capsule; Refill: 1  5. GAD (generalized anxiety disorder) Stress management - ALPRAZolam (XANAX) 0.5 MG tablet; Take 1 tablet (0.5 mg total) by mouth daily.  Dispense: 30 tablet; Refill: 5  6. PANIC DISORDER Deep breathing exercises  7. Osteopenia, unspecified location Weight bearing exercise Will repeat dexascan when machine is repaired  8. BMI 29.0-29.9,adult Discussed diet and exercise for person with BMI >25 Will recheck weight in 3-6 months  9. EMPHYSEMA - Ipratropium-Albuterol (COMBIVENT) 20-100 MCG/ACT AERS respimat; Inhale 1 puff into the lungs every  6 (six) hours.  Dispense: 12 g; Refill: 1 - ADVAIR DISKUS 100-50 MCG/DOSE AEPB; INHALE 1 DOSE BY MOUTH TWICE DAILY  Dispense: 180 each; Refill: 1    Labs pending Health Maintenance reviewed Diet and exercise encouraged  Follow up plan: 6 months   Mary-Margaret Hassell Done, FNP

## 2019-07-08 NOTE — Addendum Note (Signed)
Addended by: Chevis Pretty on: 07/08/2019 10:56 AM   Modules accepted: Orders

## 2019-08-12 ENCOUNTER — Other Ambulatory Visit: Payer: Self-pay

## 2019-08-12 ENCOUNTER — Ambulatory Visit (INDEPENDENT_AMBULATORY_CARE_PROVIDER_SITE_OTHER): Payer: PPO | Admitting: Dermatology

## 2019-08-12 ENCOUNTER — Encounter: Payer: Self-pay | Admitting: Dermatology

## 2019-08-12 DIAGNOSIS — D692 Other nonthrombocytopenic purpura: Secondary | ICD-10-CM

## 2019-08-12 DIAGNOSIS — L570 Actinic keratosis: Secondary | ICD-10-CM

## 2019-08-12 DIAGNOSIS — D229 Melanocytic nevi, unspecified: Secondary | ICD-10-CM

## 2019-08-12 DIAGNOSIS — D225 Melanocytic nevi of trunk: Secondary | ICD-10-CM

## 2019-08-12 DIAGNOSIS — L821 Other seborrheic keratosis: Secondary | ICD-10-CM

## 2019-08-12 NOTE — Patient Instructions (Addendum)
First follow-up in years for [facility]. Rausch date of birth Feb 08, 1945.  Sun exposed areas plus back examined.  There are no atypical moles or melanoma.  On the left temple and the left scapular are tan textured 6 mm papules that are benign keratoses and require no treatment.  On the central forehead for several months there has been a nonhealing pink waxy scale which is more likely a lichenoid actinic keratosis than an early nonmelanoma skin cancer.  This was treated with 5-second liquid nitrogen freeze and she can expect this will swell and may peel in the next 2 weeks.  There is no special care in terms of washing or bandage.  If the spot is perfectly smooth in 2 months then Mrs. Gabriela Wilson can cancel her follow-up.  If the freezes fail to make it disappear then we will obtain biopsy.  We also discussed the easy bruising on her forearms for which she may look for an over-the-counter cream called Dermend; this is applied daily after showering to the arms and decreases bruising in perhaps 60 or 70% of people.  It is not a cure.  [name] knows that if she has any questions about this she can call me anytime

## 2019-09-07 ENCOUNTER — Other Ambulatory Visit: Payer: Self-pay | Admitting: Nurse Practitioner

## 2019-09-07 DIAGNOSIS — F411 Generalized anxiety disorder: Secondary | ICD-10-CM

## 2019-09-15 ENCOUNTER — Encounter: Payer: Self-pay | Admitting: Dermatology

## 2019-09-15 NOTE — Progress Notes (Signed)
   New Patient   Subjective  Gabriela Wilson is a 74 y.o. female who presents for the following: Skin Problem (front of forehead, present for 6 months, can be itchy and dry at times, has tried neutragena sun block cream with some improvement).  Crust Location: Forehead Duration: Months Quality:  Associated Signs/Symptoms: Modifying Factors:  Severity:  Timing: Context:    The following portions of the chart were reviewed this encounter and updated as appropriate: Tobacco  Allergies  Meds  Problems  Med Hx  Surg Hx  Fam Hx      Objective  Well appearing patient in no apparent distress; mood and affect are within normal limits.  All sun exposed areas plus back examined.   Assessment & Plan  Lichenoid actinic keratosis Mid Forehead  AK (actinic keratosis) Head - Anterior (Face)  Destruction of lesion - Head - Anterior (Face) Complexity: simple   Destruction method: cryotherapy   Informed consent: discussed and consent obtained   Timeout:  patient name, date of birth, surgical site, and procedure verified Lesion destroyed using liquid nitrogen: Yes   Region frozen until ice ball extended beyond lesion: Yes   Outcome: patient tolerated procedure well with no complications    Seborrheic keratosis Head - Anterior (Face)  Leave if stable  Nevus Mid Back  Self examine skin twice yearly, dermatologist examination annually.  Senile purpura (HCC) (2) Left Forearm - Posterior; Right Forearm - Posterior  Dermend cream daily after bathing to areas prone to bruise.

## 2019-09-26 ENCOUNTER — Other Ambulatory Visit: Payer: Self-pay

## 2019-09-26 DIAGNOSIS — M8580 Other specified disorders of bone density and structure, unspecified site: Secondary | ICD-10-CM

## 2019-09-26 DIAGNOSIS — Z780 Asymptomatic menopausal state: Secondary | ICD-10-CM

## 2019-10-13 ENCOUNTER — Other Ambulatory Visit (HOSPITAL_COMMUNITY): Payer: Self-pay | Admitting: Nurse Practitioner

## 2019-10-13 ENCOUNTER — Ambulatory Visit: Payer: PPO | Admitting: Dermatology

## 2019-10-13 ENCOUNTER — Other Ambulatory Visit: Payer: Self-pay

## 2019-10-13 ENCOUNTER — Ambulatory Visit (INDEPENDENT_AMBULATORY_CARE_PROVIDER_SITE_OTHER): Payer: PPO | Admitting: Family Medicine

## 2019-10-13 DIAGNOSIS — Z23 Encounter for immunization: Secondary | ICD-10-CM

## 2019-10-13 DIAGNOSIS — Z1231 Encounter for screening mammogram for malignant neoplasm of breast: Secondary | ICD-10-CM

## 2019-11-21 ENCOUNTER — Ambulatory Visit (HOSPITAL_COMMUNITY)
Admission: RE | Admit: 2019-11-21 | Discharge: 2019-11-21 | Disposition: A | Discharge status: Home / Self Care | Payer: PPO | Source: Ambulatory Visit | Attending: Nurse Practitioner | Admitting: Nurse Practitioner

## 2019-11-21 ENCOUNTER — Other Ambulatory Visit: Payer: Self-pay

## 2019-11-21 DIAGNOSIS — Z1231 Encounter for screening mammogram for malignant neoplasm of breast: Secondary | ICD-10-CM

## 2019-12-19 IMAGING — MG DIGITAL SCREENING BILAT W/ TOMO W/ CAD
8 series · 8 of 24 positions shown · non-contrast
Comparison: Previous exam(s).

CLINICAL DATA: Screening.

EXAM:
DIGITAL SCREENING BILATERAL MAMMOGRAM WITH TOMO AND CAD

[L MLO synth-2D]
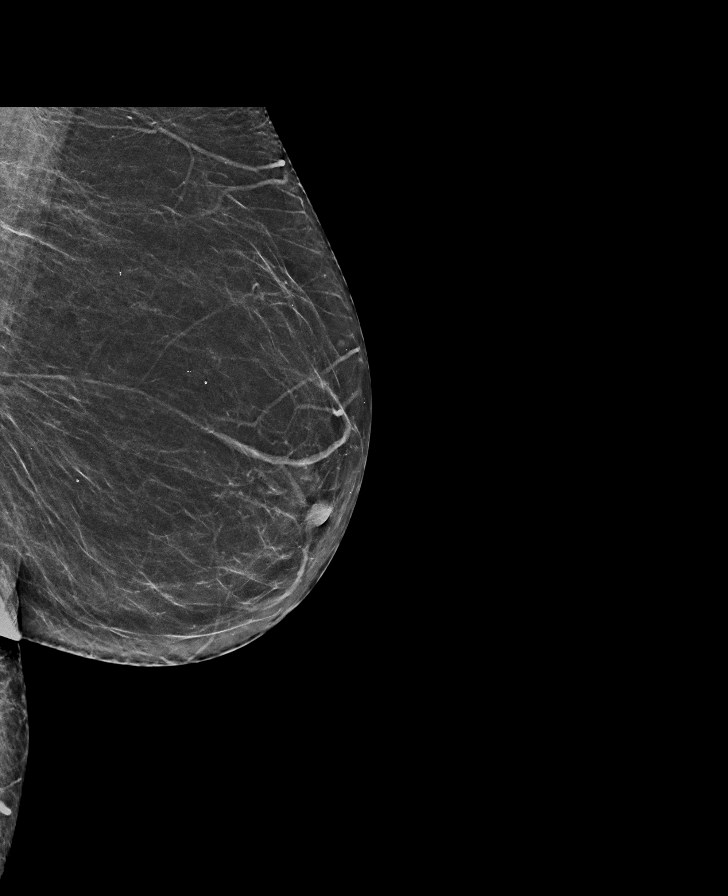

[R CC synth-2D]
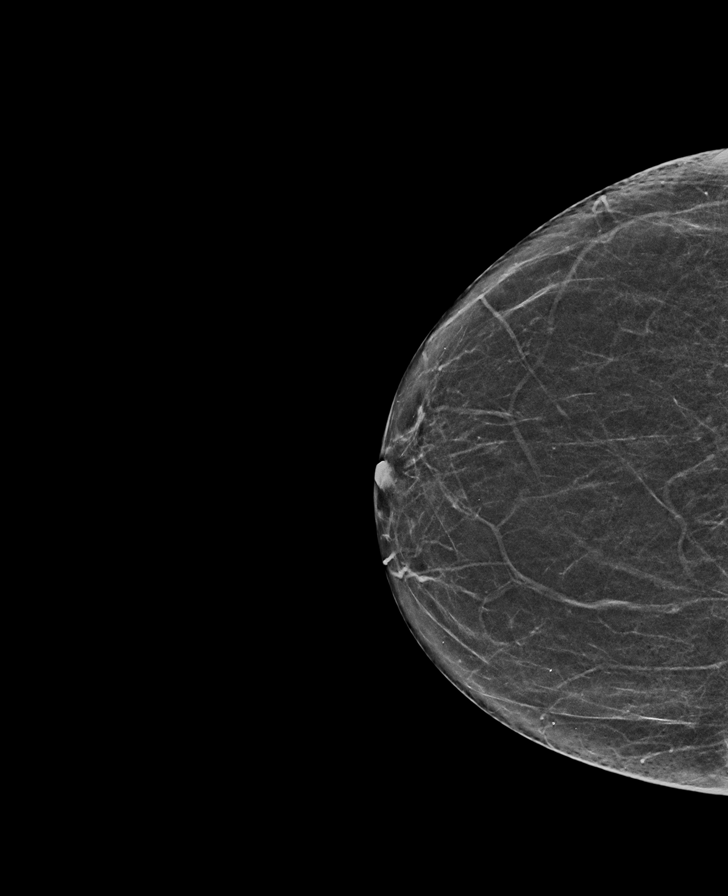

[L CC synth-2D]
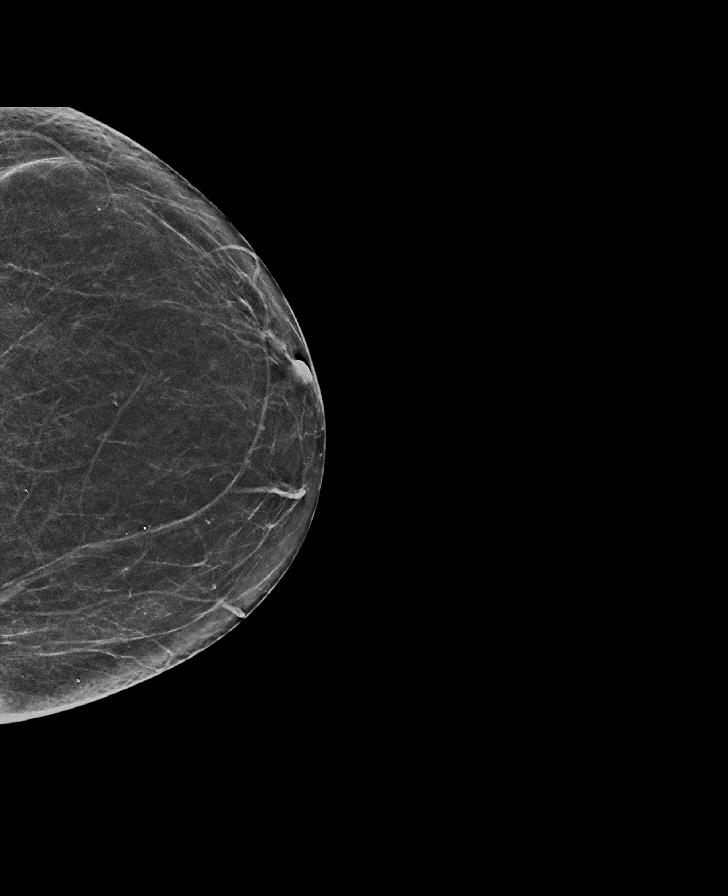

[R MLO synth-2D]
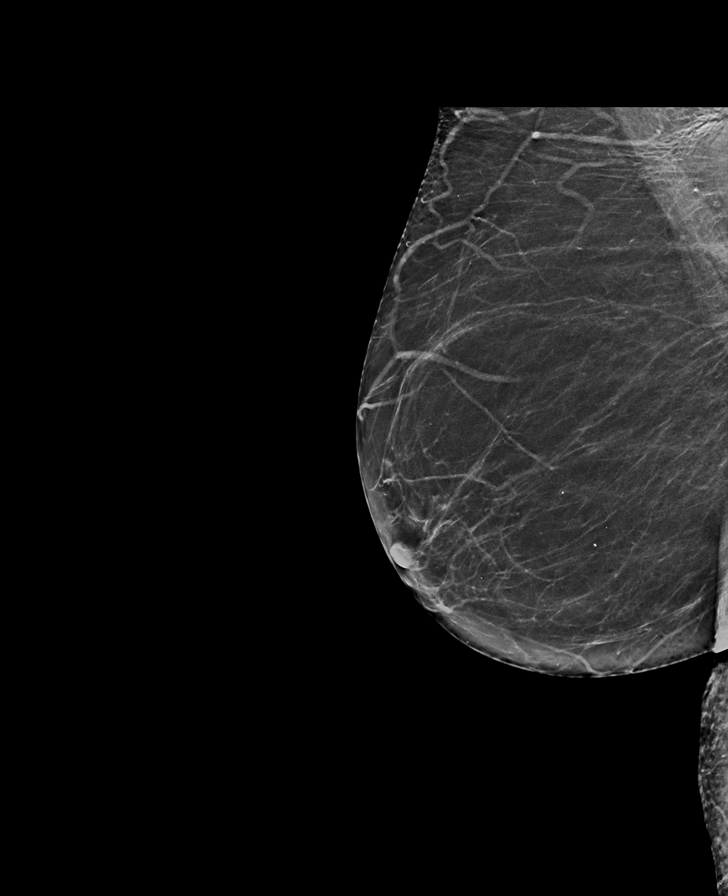

[L CC tomo · tomo slice 30/59.0]
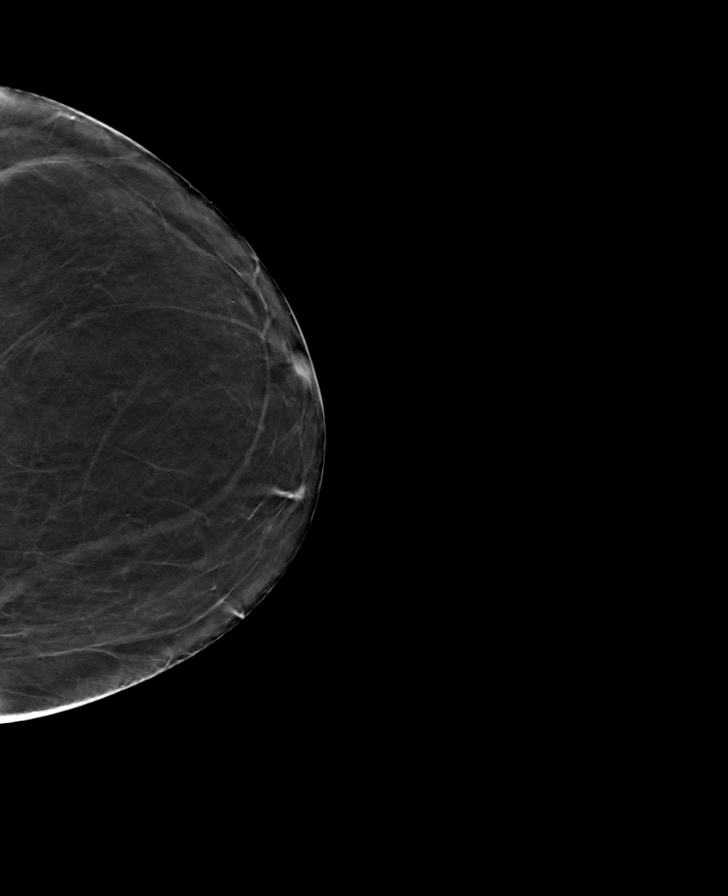

[L MLO tomo · tomo slice 31/61.0]
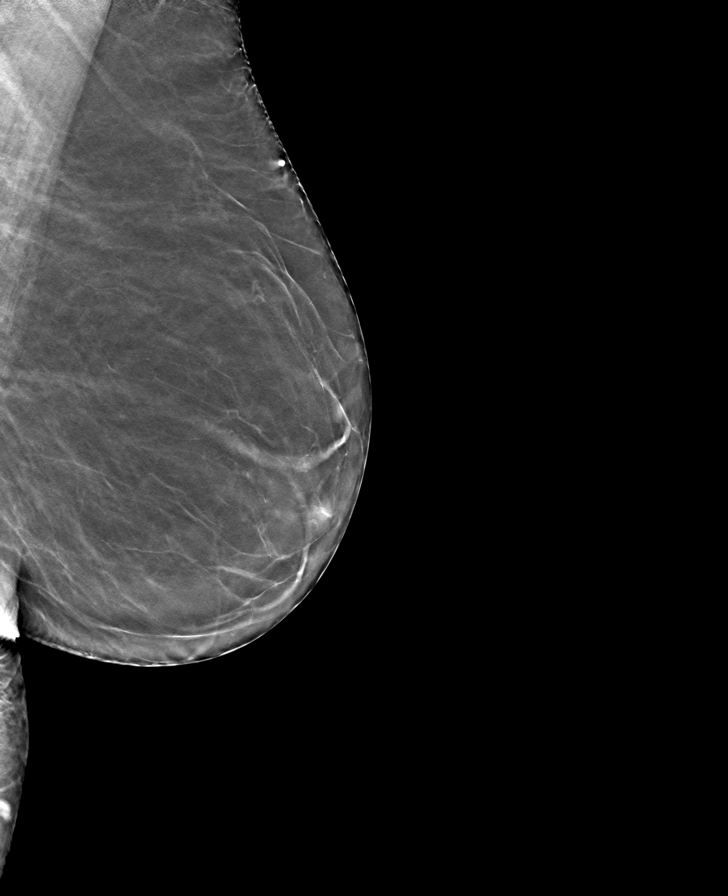

[R MLO tomo · tomo slice 31/62.0]
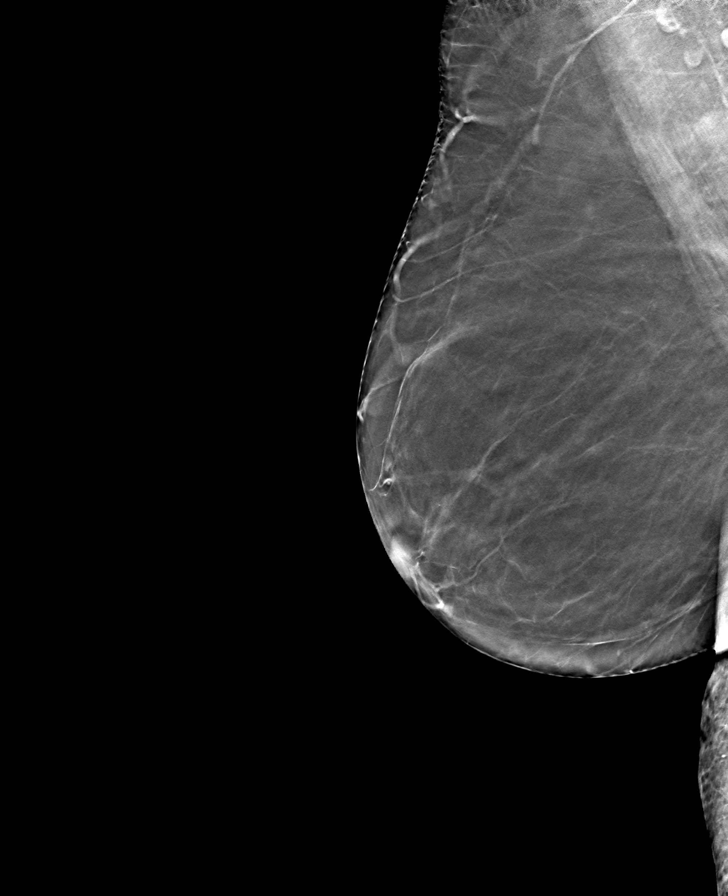

[R CC tomo · tomo slice 28/55.0]
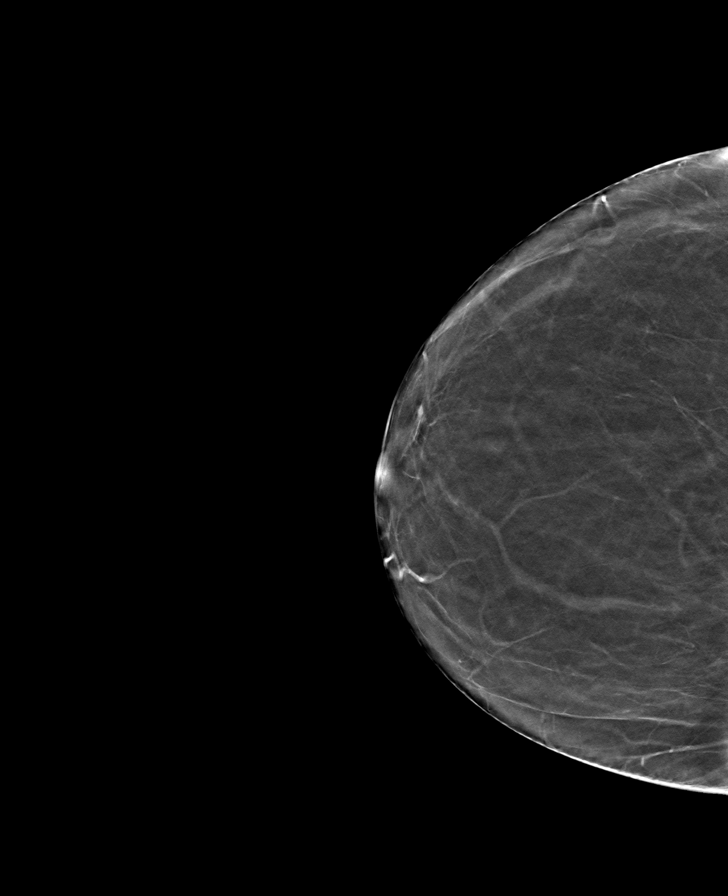

[8 of 24 positions shown; findings below may reference images not displayed]

ACR Breast Density Category b: There are scattered areas of
fibroglandular density.
FINDINGS: There are no findings suspicious for malignancy. Images were
processed with CAD.
IMPRESSION: No mammographic evidence of malignancy. A result letter of this
screening mammogram will be mailed directly to the patient.

RECOMMENDATION:
Screening mammogram in one year. (Code:CN-U-775)

BI-RADS CATEGORY  1: Negative.

## 2020-01-12 ENCOUNTER — Other Ambulatory Visit: Payer: Self-pay

## 2020-01-12 ENCOUNTER — Ambulatory Visit (INDEPENDENT_AMBULATORY_CARE_PROVIDER_SITE_OTHER): Payer: PPO | Admitting: Nurse Practitioner

## 2020-01-12 ENCOUNTER — Other Ambulatory Visit: Payer: PPO

## 2020-01-12 ENCOUNTER — Encounter: Payer: Self-pay | Admitting: Nurse Practitioner

## 2020-01-12 VITALS — BP 177/74 | HR 63 | Temp 98.4°F | Resp 20 | Ht 64.0 in | Wt 174.0 lb

## 2020-01-12 DIAGNOSIS — J439 Emphysema, unspecified: Secondary | ICD-10-CM | POA: Diagnosis not present

## 2020-01-12 DIAGNOSIS — I10 Essential (primary) hypertension: Secondary | ICD-10-CM | POA: Diagnosis not present

## 2020-01-12 DIAGNOSIS — E785 Hyperlipidemia, unspecified: Secondary | ICD-10-CM | POA: Diagnosis not present

## 2020-01-12 DIAGNOSIS — Z6829 Body mass index (BMI) 29.0-29.9, adult: Secondary | ICD-10-CM | POA: Diagnosis not present

## 2020-01-12 DIAGNOSIS — M8580 Other specified disorders of bone density and structure, unspecified site: Secondary | ICD-10-CM

## 2020-01-12 DIAGNOSIS — F411 Generalized anxiety disorder: Secondary | ICD-10-CM

## 2020-01-12 DIAGNOSIS — F410 Panic disorder [episodic paroxysmal anxiety] without agoraphobia: Secondary | ICD-10-CM | POA: Diagnosis not present

## 2020-01-12 DIAGNOSIS — K219 Gastro-esophageal reflux disease without esophagitis: Secondary | ICD-10-CM

## 2020-01-12 MED ORDER — OMEPRAZOLE 20 MG PO CPDR: 20.0000 mg | DELAYED_RELEASE_CAPSULE | Freq: Every day | ORAL | 1 refills | Status: DC

## 2020-01-12 MED ORDER — LISINOPRIL-HYDROCHLOROTHIAZIDE 20-12.5 MG PO TABS: 2.0000 | ORAL_TABLET | Freq: Every day | ORAL | 1 refills | Status: DC

## 2020-01-12 MED ORDER — ALPRAZOLAM 0.5 MG PO TABS: 0.5000 mg | ORAL_TABLET | Freq: Every day | ORAL | 5 refills | Status: DC

## 2020-01-12 MED ORDER — CIPROFLOXACIN HCL 500 MG PO TABS: 500.0000 mg | ORAL_TABLET | Freq: Two times a day (BID) | ORAL | 0 refills | Status: DC

## 2020-01-12 MED ORDER — METRONIDAZOLE 500 MG PO TABS: 500.0000 mg | ORAL_TABLET | Freq: Two times a day (BID) | ORAL | 0 refills | Status: DC

## 2020-01-12 MED ORDER — ALBUTEROL SULFATE HFA 108 (90 BASE) MCG/ACT IN AERS: 2.0000 | INHALATION_SPRAY | Freq: Four times a day (QID) | RESPIRATORY_TRACT | 1 refills | Status: DC | PRN

## 2020-01-12 MED ORDER — ADVAIR DISKUS 100-50 MCG/DOSE IN AEPB: INHALATION_SPRAY | RESPIRATORY_TRACT | 1 refills | Status: DC

## 2020-01-12 NOTE — Progress Notes (Addendum)
Subjective:    Patient ID: Gabriela Wilson, female    DOB: 27-Jan-1945, 74 y.o.   MRN: [id number]   Chief Complaint: Medical Management of Chronic Issues    HPI:  1. Essential hypertension, benign No c/o chest pain, sob or headache. Does not check blood pressure at home. We increased her lisinopril at last visit. Her blood pressure at home runs in the 132'G systolic. BP Readings from Last 3 Encounters:  01/12/20 (!) 177/74  07/08/19 (!) 160/66  01/07/19 (!) 148/68     2. Hyperlipidemia with target LDL less than 100 Tries to watch diet and stays very active. Lab Results  Component Value Date   CHOL 183 07/08/2019   HDL 43 07/08/2019   LDLCALC 107 (H) 07/08/2019   TRIG 187 (H) 07/08/2019   CHOLHDL 4.3 07/08/2019     3. Pulmonary emphysema, unspecified emphysema type ([place]) Uses advair daily and s doing well. Denies cough or SOB.  4. Gastroesophageal reflux disease, unspecified whether esophagitis present Is on omeprazole daily and os doing well.  5. Osteopenia, unspecified location Last dexascan was done in 2016. She refuses to do it today. SHe is on a daily vitamin d and calcium supplement  6. PANIC DISORDER She denies any recent panic attachs.  7. GAD (generalized anxiety disorder) Has anxiety, but the xanax really helps keep her calm GAD 7 : Generalized Anxiety Score 01/12/2020 07/08/2019 01/07/2019  Nervous, Anxious, on Edge 1 1 0  Control/stop worrying 0 0 0  Worry too much - different things 0 0 0  Trouble relaxing 1 1 0  Restless 0 0 0  Easily annoyed or irritable 0 0 0  Afraid - awful might happen 0 0 0  Total GAD 7 Score 2 2 0  Anxiety Difficulty Not difficult at all Not difficult at all Not difficult at all      8. BMI 29.0-29.9,adult No recent weight changes Wt Readings from Last 3 Encounters:  01/12/20 174 lb (78.9 kg)  07/08/19 170 lb (77.1 kg)  01/07/19 176 lb (79.8 kg)   BMI Readings from Last 3 Encounters:  01/12/20 29.87 kg/m  07/08/19  29.18 kg/m  01/07/19 30.21 kg/m       Outpatient Encounter Medications as of 01/12/2020  Medication Sig  . ADVAIR DISKUS 100-50 MCG/DOSE AEPB INHALE 1 DOSE BY MOUTH TWICE DAILY  . albuterol (PROAIR HFA) 108 (90 Base) MCG/ACT inhaler Inhale 2 puffs into the lungs every 6 (six) hours as needed.  . ALPRAZolam (XANAX) 0.5 MG tablet Take 1 tablet by mouth once daily  . Ipratropium-Albuterol (COMBIVENT) 20-100 MCG/ACT AERS respimat Inhale 1 puff into the lungs every 6 (six) hours.  Marland Kitchen lisinopril-hydrochlorothiazide (ZESTORETIC) 20-12.5 MG tablet Take 1 tablet by mouth daily.  . Multiple Vitamin (MULTIVITAMIN WITH MINERALS) TABS tablet Take 1 tablet by mouth daily.  Marland Kitchen omeprazole (PRILOSEC) 20 MG capsule Take 1 capsule (20 mg total) by mouth daily.   No facility-administered encounter medications on file as of 01/12/2020.    Past Surgical History:  Procedure Laterality Date  . TUBAL LIGATION      Family History  Problem Relation Age of Onset  . Asthma Mother   . Hypertension Mother   . Dementia Mother   . Diabetes Father   . COPD Father   . Diabetes Sister   . Hypertension Sister   . Diabetes Brother   . Early death Brother        MVA with quadrapelgic  . Diabetes Brother   .  Dementia Brother   . Diabetes Brother   . Cancer Sister        vulva  . Colon cancer Neg Hx     New complaints: Has had left lower quadrant pain for the last 2 days- feels like diverticulitis flare up  Social history: Lives with husband  Controlled substance contract: 01/12/20    Review of Systems  Constitutional: Negative for diaphoresis.  Eyes: Negative for pain.  Respiratory: Negative for shortness of breath.   Cardiovascular: Negative for chest pain, palpitations and leg swelling.  Gastrointestinal: Positive for abdominal pain (left lower quadrant). Negative for constipation, diarrhea, nausea, rectal pain and vomiting.  Endocrine: Negative for polydipsia.  Skin: Negative for rash.   Neurological: Negative for dizziness, weakness and headaches.  Hematological: Does not bruise/bleed easily.  All other systems reviewed and are negative.      Objective:   Physical Exam Vitals and nursing note reviewed.  Constitutional:      General: She is not in acute distress.    Appearance: Normal appearance. She is well-developed and well-nourished.  HENT:     Head: Normocephalic.     Nose: Nose normal.     Mouth/Throat:     Mouth: Oropharynx is clear and moist.  Eyes:     Extraocular Movements: EOM normal.     Pupils: Pupils are equal, round, and reactive to light.  Neck:     Vascular: No carotid bruit or JVD.  Cardiovascular:     Rate and Rhythm: Normal rate and regular rhythm.     Pulses: Intact distal pulses.     Heart sounds: Normal heart sounds.  Pulmonary:     Effort: Pulmonary effort is normal. No respiratory distress.     Breath sounds: Normal breath sounds. No wheezing or rales.  Chest:     Chest wall: No tenderness.  Abdominal:     General: Bowel sounds are normal. There is no distension or abdominal bruit. Aorta is normal.     Palpations: Abdomen is soft. There is no hepatomegaly, splenomegaly, mass or pulsatile mass.     Tenderness: There is no abdominal tenderness (left lower quadrant).  Musculoskeletal:        General: No edema. Normal range of motion.     Cervical back: Normal range of motion and neck supple.  Lymphadenopathy:     Cervical: No cervical adenopathy.  Skin:    General: Skin is warm and dry.  Neurological:     Mental Status: She is alert and oriented to person, place, and time.     Deep Tendon Reflexes: Reflexes are normal and symmetric.  Psychiatric:        Mood and Affect: Mood and affect normal.        Behavior: Behavior normal.        Thought Content: Thought content normal.        Judgment: Judgment normal.    BP (!) 177/74   Pulse 63   Temp 98.4 F (36.9 C) (Temporal)   Resp 20   Ht 5' 4"  (1.626 m)   Wt 174 lb (78.9  kg)   SpO2 99%   BMI 29.87 kg/m         Assessment & Plan:  Gabriela Wilson comes in today with chief complaint of Medical Management of Chronic Issues   Diagnosis and orders addressed:  1. Essential hypertension, benign Low sodium diet - lisinopril-hydrochlorothiazide (ZESTORETIC) 20-12.5 MG tablet; Take 2 tablets by mouth daily.  Dispense: 180 tablet; Refill:  1 - CBC with Differential/Platelet - CMP14+EGFR  2. Hyperlipidemia with target LDL less than 100 Low fat diet - Lipid panel  3. Pulmonary emphysema, unspecified emphysema type ([place]) Continue adviar daily - ADVAIR DISKUS 100-50 MCG/DOSE AEPB; INHALE 1 DOSE BY MOUTH TWICE DAILY  Dispense: 180 each; Refill: 1 - albuterol (PROAIR HFA) 108 (90 Base) MCG/ACT inhaler; Inhale 2 puffs into the lungs every 6 (six) hours as needed.  Dispense: 3 each; Refill: 1  4. Osteopenia, unspecified location Weight bearing exercises Will do dexascan next visit  5. PANIC DISORDER  6. GAD (generalized anxiety disorder) Stress management - ALPRAZolam (XANAX) 0.5 MG tablet; Take 1 tablet (0.5 mg total) by mouth daily.  Dispense: 30 tablet; Refill: 5  7. BMI 29.0-29.9,adult Discussed diet and exercise for person with BMI >25 Will recheck weight in 3-6 months   8. Gastroesophageal reflux disease Avoid spicy foods Do not eat 2 hours prior to bedtime - omeprazole (PRILOSEC) 20 MG capsule; Take 1 capsule (20 mg total) by mouth daily.  Dispense: 90 capsule; Refill: 1  9. Diverticulitis cipro 535m bid for 7 days [id number] po bid for 7 days  Labs pending Health Maintenance reviewed Diet and exercise encouraged  Follow up plan: 6 months   Mary-Margaret MaHassell DoneFNP

## 2020-01-12 NOTE — Addendum Note (Signed)
Addended by: Chevis Pretty on: 01/12/2020 01:14 PM   Modules accepted: Orders

## 2020-01-12 NOTE — Patient Instructions (Signed)

## 2020-01-13 LAB — CMP14+EGFR
ALT: 20 IU/L (ref 0–32)
AST: 17 IU/L (ref 0–40)
Albumin/Globulin Ratio: 3 — ABNORMAL HIGH (ref 1.2–2.2)
Albumin: 5.1 g/dL — ABNORMAL HIGH (ref 3.7–4.7)
Alkaline Phosphatase: 50 IU/L (ref 44–121)
BUN/Creatinine Ratio: 19 (ref 12–28)
BUN: 18 mg/dL (ref 8–27)
Bilirubin Total: 0.7 mg/dL (ref 0.0–1.2)
CO2: 25 mmol/L (ref 20–29)
Calcium: 10.5 mg/dL — ABNORMAL HIGH (ref 8.7–10.3)
Chloride: 106 mmol/L (ref 96–106)
Creatinine, Ser: 0.97 mg/dL (ref 0.57–1.00)
GFR calc Af Amer: 67 mL/min/{1.73_m2} (ref >59)
GFR calc non Af Amer: 58 mL/min/{1.73_m2} — ABNORMAL LOW (ref >59)
Globulin, Total: 1.7 g/dL (ref 1.5–4.5)
Glucose: 105 mg/dL — ABNORMAL HIGH (ref 65–99)
Potassium: 4.9 mmol/L (ref 3.5–5.2)
Sodium: 146 mmol/L — ABNORMAL HIGH (ref 134–144)
Total Protein: 6.8 g/dL (ref 6.0–8.5)

## 2020-01-13 LAB — CBC WITH DIFFERENTIAL/PLATELET
Basophils Absolute: 0.1 10*3/uL (ref 0.0–0.2)
Basos: 1 %
EOS (ABSOLUTE): 0.2 10*3/uL (ref 0.0–0.4)
Eos: 3 %
Hematocrit: 42.4 % (ref 34.0–46.6)
Hemoglobin: 14.7 g/dL (ref 11.1–15.9)
Immature Grans (Abs): 0 10*3/uL (ref 0.0–0.1)
Immature Granulocytes: 0 %
Lymphocytes Absolute: 1.6 10*3/uL (ref 0.7–3.1)
Lymphs: 29 %
MCH: 31.5 pg (ref 26.6–33.0)
MCHC: 34.7 g/dL (ref 31.5–35.7)
MCV: 91 fL (ref 79–97)
Monocytes Absolute: 0.3 10*3/uL (ref 0.1–0.9)
Monocytes: 6 %
Neutrophils Absolute: 3.4 10*3/uL (ref 1.4–7.0)
Neutrophils: 61 %
Platelets: 258 10*3/uL (ref 150–450)
RBC: 4.66 x10E6/uL (ref 3.77–5.28)
RDW: 12.9 % (ref 11.7–15.4)
WBC: 5.6 10*3/uL (ref 3.4–10.8)

## 2020-01-13 LAB — LIPID PANEL
Chol/HDL Ratio: 3.7 ratio (ref 0.0–4.4)
Cholesterol, Total: 201 mg/dL — ABNORMAL HIGH (ref 100–199)
HDL: 54 mg/dL (ref >39)
LDL Chol Calc (NIH): 125 mg/dL — ABNORMAL HIGH (ref 0–99)
Triglycerides: 123 mg/dL (ref 0–149)
VLDL Cholesterol Cal: 22 mg/dL (ref 5–40)

## 2020-03-25 ENCOUNTER — Ambulatory Visit (INDEPENDENT_AMBULATORY_CARE_PROVIDER_SITE_OTHER): Payer: PPO | Admitting: Family Medicine

## 2020-03-25 ENCOUNTER — Encounter: Payer: Self-pay | Admitting: Family Medicine

## 2020-03-25 DIAGNOSIS — J069 Acute upper respiratory infection, unspecified: Secondary | ICD-10-CM

## 2020-03-25 MED ORDER — AMOXICILLIN-POT CLAVULANATE 875-125 MG PO TABS: 1.0000 | ORAL_TABLET | Freq: Two times a day (BID) | ORAL | 0 refills | Status: AC

## 2020-03-25 MED ORDER — BENZONATATE 100 MG PO CAPS
100.0000 mg | ORAL_CAPSULE | Freq: Three times a day (TID) | ORAL | 0 refills | Status: DC | PRN
Start: 2020-03-25 — End: 2020-06-16

## 2020-03-25 NOTE — Progress Notes (Signed)
Virtual Visit via telephone Note Due to COVID-19 pandemic this visit was conducted virtually. This visit type was conducted due to national recommendations for restrictions regarding the COVID-19 Pandemic (e.g. social distancing, sheltering in place) in an effort to limit this patient's exposure and mitigate transmission in our community. All issues noted in this document were discussed and addressed.  A physical exam was not performed with this format.  I connected with Gabriela Wilson on 03/25/20 at 1136 by telephone and verified that I am speaking with the correct person using two identifiers. Gabriela Wilson is currently located at home and no one is currently with her the during visit. The provider, Gwenlyn Perking, FNP is located in their office at time of visit.  I discussed the limitations, risks, security and privacy concerns of performing an evaluation and management service by telephone and the availability of in person appointments. I also discussed with the patient that there may be a patient responsible charge related to this service. The patient expressed understanding and agreed to proceed.  CC: chest congestion  History and Present Illness:  HPI  Gabriela Wilson reports nasal congestion and headache that started 5 days ago. Today she has had an increased cough and chest congestion. Reports cough is dry. She denies fever, chills, weakness, shortness of breath, or chest pain. Denies nausea, vomiting, or sore throat. She has had sweating at night. She also reports some wheezing this morning that was relieved by her albuterol inhaler. She has a history of COPD. She has been using her Advair daily and albuterol as needed. She has tried tylenol, nyquil, and mucinex without much improvement. She has had her flu and Covid vaccines. She denies known exposure. She has a home Covid test that she can take. She does not have a pulse oximeter at home.    ROS As per HPI.   Observations/Objective: Alert  and oriented x 3. Able to speak in full sentences without difficulty.   Assessment and Plan: Gabriela Wilson was seen today for chest congestion.  Diagnoses and all orders for this visit:  Upper respiratory tract infection, unspecified type Patient will take home Covid test and notify provider if test is positive. Continue mucinex for congestion. Tylenol as needed. Continue inhalers. Rest and stay well hydrated. Given history of COPD, will treat empirically with antibiotics. Tessalon perles for cough. Return to office for new or worsening symptoms, or if symptoms persist.  -     amoxicillin-clavulanate (AUGMENTIN) 875-125 MG tablet; Take 1 tablet by mouth 2 (two) times daily for 10 days. -     benzonatate (TESSALON PERLES) 100 MG capsule; Take 1 capsule (100 mg total) by mouth 3 (three) times daily as needed for cough.   Follow Up Instructions: Return to office for new or worsening symptoms, or if symptoms persist.     I discussed the assessment and treatment plan with the patient. The patient was provided an opportunity to ask questions and all were answered. The patient agreed with the plan and demonstrated an understanding of the instructions.   The patient was advised to call back or seek an in-person evaluation if the symptoms worsen or if the condition fails to improve as anticipated.  The above assessment and management plan was discussed with the patient. The patient verbalized understanding of and has agreed to the management plan. Patient is aware to call the clinic if symptoms persist or worsen. Patient is aware when to return to the clinic for a follow-up visit. Patient  educated on when it is appropriate to go to the emergency department.   Time call ended:  1148  I provided 12 minutes of phone time during this encounter.   Gwenlyn Perking, FNP

## 2020-05-28 ENCOUNTER — Ambulatory Visit (INDEPENDENT_AMBULATORY_CARE_PROVIDER_SITE_OTHER): Payer: PPO | Admitting: Nurse Practitioner

## 2020-05-28 ENCOUNTER — Encounter: Payer: Self-pay | Admitting: Nurse Practitioner

## 2020-05-28 DIAGNOSIS — K5732 Diverticulitis of large intestine without perforation or abscess without bleeding: Secondary | ICD-10-CM | POA: Diagnosis not present

## 2020-05-28 MED ORDER — METRONIDAZOLE 500 MG PO TABS: 500.0000 mg | ORAL_TABLET | Freq: Two times a day (BID) | ORAL | 0 refills | Status: DC

## 2020-05-28 MED ORDER — CIPROFLOXACIN HCL 500 MG PO TABS: 500.0000 mg | ORAL_TABLET | Freq: Two times a day (BID) | ORAL | 0 refills | Status: DC

## 2020-05-28 NOTE — Progress Notes (Signed)
Virtual Visit  Note Due to COVID-19 pandemic this visit was conducted virtually. This visit type was conducted due to national recommendations for restrictions regarding the COVID-19 Pandemic (e.g. social distancing, sheltering in place) in an effort to limit this patient's exposure and mitigate transmission in our community. All issues noted in this document were discussed and addressed.  A physical exam was not performed with this format.  I connected with Gabriela Wilson on 05/28/20 at 10:14 by telephone and verified that I am speaking with the correct person using two identifiers. Gabriela Wilson is currently located at home and  Her husband is currently with her during visit. The provider, Mary-Margaret Hassell Done, FNP is located in their office at time of visit.  I discussed the limitations, risks, security and privacy concerns of performing an evaluation and management service by telephone and the availability of in person appointments. I also discussed with the patient that there may be a patient responsible charge related to this service. The patient expressed understanding and agreed to proceed.   History and Present Illness:   Chief Complaint: Abdominal Pain   HPI Patient calls in c/o left lower quadrant pain. Started about 3 days ago. She has a history of diverticulitis. The pain is the same as is when she has flare up. She went to family reunion over weekend and said she probably  ate something she shouldn't have eaten.   Review of Systems  Gastrointestinal: Positive for abdominal pain. Negative for blood in stool, constipation, diarrhea, nausea and vomiting.  All other systems reviewed and are negative.    Observations/Objective: Alert and oriented- answers all questions appropriately No distress Left lower quadrant pain on palpation   Assessment and Plan: [name] in today with chief complaint of Abdominal Pain   1. Diverticulitis of large intestine without  perforation or abscess without bleeding Watch diet Meds ordered this encounter  Medications  . ciprofloxacin (CIPRO) 500 MG tablet    Sig: Take 1 tablet (500 mg total) by mouth 2 (two) times daily.    Dispense:  14 tablet    Refill:  0    Order Specific Question:   Supervising Provider    Answer:   Caryl Pina A [id number]  . metroNIDAZOLE (FLAGYL) 500 MG tablet    Sig: Take 1 tablet (500 mg total) by mouth 2 (two) times daily.    Dispense:  14 tablet    Refill:  0    Order Specific Question:   Supervising Provider    Answer:   Caryl Pina A [[id number]]         Follow Up Instructions: prn    I discussed the assessment and treatment plan with the patient. The patient was provided an opportunity to ask questions and all were answered. The patient agreed with the plan and demonstrated an understanding of the instructions.   The patient was advised to call back or seek an in-person evaluation if the symptoms worsen or if the condition fails to improve as anticipated.  The above assessment and management plan was discussed with the patient. The patient verbalized understanding of and has agreed to the management plan. Patient is aware to call the clinic if symptoms persist or worsen. Patient is aware when to return to the clinic for a follow-up visit. Patient educated on when it is appropriate to go to the emergency department.   Time call ended:  10:26  I provided 12 minutes of  non face-to-face time during this  encounter.    Mary-Margaret Hassell Done, FNP

## 2020-06-16 ENCOUNTER — Other Ambulatory Visit: Payer: Self-pay

## 2020-06-16 ENCOUNTER — Encounter: Payer: Self-pay | Admitting: Nurse Practitioner

## 2020-06-16 ENCOUNTER — Ambulatory Visit (INDEPENDENT_AMBULATORY_CARE_PROVIDER_SITE_OTHER): Payer: PPO | Admitting: Nurse Practitioner

## 2020-06-16 VITALS — BP 119/64 | HR 73 | Temp 97.5°F | Ht 64.0 in | Wt 171.0 lb

## 2020-06-16 DIAGNOSIS — M5489 Other dorsalgia: Secondary | ICD-10-CM | POA: Diagnosis not present

## 2020-06-16 DIAGNOSIS — K5732 Diverticulitis of large intestine without perforation or abscess without bleeding: Secondary | ICD-10-CM | POA: Diagnosis not present

## 2020-06-16 MED ORDER — METRONIDAZOLE 500 MG PO TABS: 500.0000 mg | ORAL_TABLET | Freq: Two times a day (BID) | ORAL | 0 refills | Status: DC

## 2020-06-16 MED ORDER — CIPROFLOXACIN HCL 500 MG PO TABS: 500.0000 mg | ORAL_TABLET | Freq: Two times a day (BID) | ORAL | 0 refills | Status: AC

## 2020-06-16 NOTE — Patient Instructions (Signed)
Diverticulitis  Diverticulitis is when small pouches in your colon (large intestine) get infected or swollen. This causes pain in the belly (abdomen) and watery poop (diarrhea). These pouches are called diverticula. The pouches form in people who have a condition called diverticulosis. What are the causes? This condition may be caused by poop (stool) that gets trapped in the pouches in your colon. The poop lets germs (bacteria) grow in the pouches. This causes the infection. What increases the risk? You are more likely to get this condition if you have small pouches in your colon. The risk is higher if:  You are overweight or very overweight (obese).  You do not exercise enough.  You drink alcohol.  You smoke or use products with tobacco in them.  You eat a diet that has a lot of red meat such as beef, pork, or lamb.  You eat a diet that does not have enough fiber in it.  You are older than 74 years of age. What are the signs or symptoms?  Pain in the belly. Pain is often on the left side, but it may be in other areas.  Fever and feeling cold.  Feeling like you may vomit.  Vomiting.  Having cramps.  Feeling full.  Changes to how often you poop.  Blood in your poop. How is this treated? Most cases are treated at home by:  Taking over-the-counter pain medicines.  Following a clear liquid diet.  Taking antibiotic medicines.  Resting. Very bad cases may need to be treated at a hospital. This may include:  Not eating or drinking.  Taking prescription pain medicine.  Getting antibiotic medicines through an IV tube.  Getting fluid and food through an IV tube.  Having surgery. When you are feeling better, your doctor may tell you to have a test to check your colon (colonoscopy). Follow these instructions at home: Medicines  Take over-the-counter and prescription medicines only as told by your doctor. These include: ? Antibiotics. ? Pain medicines. ? Fiber  pills. ? Probiotics. ? Stool softeners.  If you were prescribed an antibiotic medicine, take it as told by your doctor. Do not stop taking the antibiotic even if you start to feel better.  Ask your doctor if the medicine prescribed to you requires you to avoid driving or using machinery. Eating and drinking  Follow a diet as told by your doctor.  When you feel better, your doctor may tell you to change your diet. You may need to eat a lot of fiber. Fiber makes it easier to poop (have a bowel movement). Foods with fiber include: ? Berries. ? Beans. ? Lentils. ? Green vegetables.  Avoid eating red meat.   General instructions  Do not use any products that contain nicotine or tobacco, such as cigarettes, e-cigarettes, and chewing tobacco. If you need help quitting, ask your doctor.  Exercise 3 or more times a week. Try to get 30 minutes each time. Exercise enough to sweat and make your heart beat faster.  Keep all follow-up visits as told by your doctor. This is important. Contact a doctor if:  Your pain does not get better.  You are not pooping like normal. Get help right away if:  Your pain gets worse.  Your symptoms do not get better.  Your symptoms get worse very fast.  You have a fever.  You vomit more than one time.  You have poop that is: ? Bloody. ? Black. ? Tarry. Summary  This condition happens when   small pouches in your colon get infected or swollen.  Take medicines only as told by your doctor.  Follow a diet as told by your doctor.  Keep all follow-up visits as told by your doctor. This is important. This information is not intended to replace advice given to you by your health care provider. Make sure you discuss any questions you have with your health care provider. Document Revised: 10/21/2018 Document Reviewed: 10/21/2018 Elsevier Patient Education  2021 Elsevier Inc.  

## 2020-06-17 DIAGNOSIS — M5489 Other dorsalgia: Secondary | ICD-10-CM | POA: Insufficient documentation

## 2020-06-17 DIAGNOSIS — K5732 Diverticulitis of large intestine without perforation or abscess without bleeding: Secondary | ICD-10-CM | POA: Insufficient documentation

## 2020-06-17 DIAGNOSIS — K5790 Diverticulosis of intestine, part unspecified, without perforation or abscess without bleeding: Secondary | ICD-10-CM | POA: Insufficient documentation

## 2020-06-17 NOTE — Assessment & Plan Note (Signed)
Symptoms of back pain is new for patient in the last few weeks.  Patient reports back pain is worse with abdominal pain she describes pain as mild to moderate. Encouraged to use Tylenol as needed for pain, warm compress.  Follow-up with worsening unresolved symptoms.

## 2020-06-17 NOTE — Assessment & Plan Note (Signed)
Unresolved symptoms of diverticulitis of the large intestine without perforation or abscess without bleeding.  Patient reports symptoms worsened after eating food with seeds and has been painful ever since.  Started patient on metronidazole and Cipro, advised patient to continue Metamucil as prescribed, avoid foods with seeds like strawberries, kiwi and tomatoes.  Follow-up with worsening or unresolved symptoms.  Patient verbalized understanding.  Rx sent to pharmacy.

## 2020-06-17 NOTE — Progress Notes (Signed)
Acute Office Visit  Subjective:    Patient ID: Gabriela Wilson, female    DOB: [birth year], 75 y.o.   MRN: [id number]  Chief Complaint  Patient presents with  . Abdominal Pain  . Back Pain    Abdominal Pain This is a recurrent problem. The current episode started in the past 7 days. The onset quality is gradual. The problem occurs constantly. The problem has been unchanged. The pain is located in the LLQ and generalized abdominal region. The pain is moderate. The quality of the pain is aching. The abdominal pain radiates to the back. Associated symptoms include constipation and nausea. Pertinent negatives include no diarrhea, fever or headaches. Exacerbated by: food. The pain is relieved by nothing. She has tried antibiotics for the symptoms. The treatment provided mild relief.  Back Pain This is a new problem. The current episode started in the past 7 days. The problem occurs every several days. The problem is unchanged. The pain is present in the lumbar spine. The quality of the pain is described as aching. The pain does not radiate. The pain is moderate. Stiffness is present all day. Associated symptoms include abdominal pain. Pertinent negatives include no fever or headaches. She has tried nothing for the symptoms.     Past Medical History:  Diagnosis Date  . Asthma   . COPD (chronic obstructive pulmonary disease) ([place])   . Hyperlipidemia   . Hypertension   . Osteopenia     Past Surgical History:  Procedure Laterality Date  . TUBAL LIGATION      Family History  Problem Relation Age of Onset  . Asthma Mother   . Hypertension Mother   . Dementia Mother   . Diabetes Father   . COPD Father   . Diabetes Sister   . Hypertension Sister   . Diabetes Brother   . Early death Brother        MVA with quadrapelgic  . Diabetes Brother   . Dementia Brother   . Diabetes Brother   . Cancer Sister        vulva  . Colon cancer Neg Hx     Social History   Socioeconomic History   . Marital status: Married    Spouse name: Not on file  . Number of children: 3  . Years of education: Not on file  . Highest education level: 11th grade  Occupational History    Employer: [employer]    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  Tobacco Use  . Smoking status: Former Smoker    Quit date: 01/24/1992    Years since quitting: 28.4  . Smokeless tobacco: Never Used  Vaping Use  . Vaping Use: Never used  Substance and Sexual Activity  . Alcohol use: Yes    Comment: wine about every 2 weeks  . Drug use: No  . Sexual activity: Yes  Other Topics Concern  . Not on file  Social History Narrative  . Not on file   Social Determinants of Health   Financial Resource Strain: Not on file  Food Insecurity: Not on file  Transportation Needs: Not on file  Physical Activity: Not on file  Stress: Not on file  Social Connections: Not on file  Intimate Partner Violence: Not on file    Outpatient Medications Prior to Visit  Medication Sig Dispense Refill  . ADVAIR DISKUS 100-50 MCG/DOSE AEPB INHALE 1 DOSE BY MOUTH TWICE DAILY 180 each 1  . albuterol (PROAIR HFA) 108 (90 Base) MCG/ACT inhaler Inhale  2 puffs into the lungs every 6 (six) hours as needed. 3 each 1  . ALPRAZolam (XANAX) 0.5 MG tablet Take 1 tablet (0.5 mg total) by mouth daily. 30 tablet 5  . Ipratropium-Albuterol (COMBIVENT) 20-100 MCG/ACT AERS respimat Inhale 1 puff into the lungs every 6 (six) hours. 12 g 1  . lisinopril-hydrochlorothiazide (ZESTORETIC) 20-12.5 MG tablet Take 2 tablets by mouth daily. 180 tablet 1  . Multiple Vitamin (MULTIVITAMIN WITH MINERALS) TABS tablet Take 1 tablet by mouth daily.    Marland Kitchen omeprazole (PRILOSEC) 20 MG capsule Take 1 capsule (20 mg total) by mouth daily. 90 capsule 1  . benzonatate (TESSALON PERLES) 100 MG capsule Take 1 capsule (100 mg total) by mouth 3 (three) times daily as needed for cough. 20 capsule 0  . ciprofloxacin (CIPRO) 500 MG tablet Take 1 tablet (500 mg total) by mouth 2 (two) times  daily. 14 tablet 0  . metroNIDAZOLE (FLAGYL) 500 MG tablet Take 1 tablet (500 mg total) by mouth 2 (two) times daily. 14 tablet 0   No facility-administered medications prior to visit.    No Known Allergies  Review of Systems  Constitutional: Negative for fever.  HENT: Negative.   Respiratory: Negative.   Gastrointestinal: Positive for abdominal pain, constipation and nausea. Negative for diarrhea.  Musculoskeletal: Positive for back pain.  Neurological: Negative for headaches.  All other systems reviewed and are negative.      Objective:    Physical Exam Vitals and nursing note reviewed.  Constitutional:      Appearance: She is well-developed.  HENT:     Head: Normocephalic.  Cardiovascular:     Heart sounds: Normal heart sounds.  Pulmonary:     Effort: Pulmonary effort is normal.     Breath sounds: Normal breath sounds.  Abdominal:     Palpations: Abdomen is soft.     Tenderness: There is abdominal tenderness. There is no right CVA tenderness or left CVA tenderness.  Skin:    General: Skin is warm.     Findings: No rash.  Neurological:     General: No focal deficit present.     Mental Status: She is alert and oriented to person, place, and time.     BP 119/64   Pulse 73   Temp (!) 97.5 F (36.4 C) (Temporal)   Ht 5\' 4"  (1.626 m)   Wt 171 lb (77.6 kg)   BMI 29.35 kg/m  Wt Readings from Last 3 Encounters:  06/16/20 171 lb (77.6 kg)  01/12/20 174 lb (78.9 kg)  07/08/19 170 lb (77.1 kg)    Health Maintenance Due  Topic Date Due  . Zoster Vaccines- Shingrix (1 of 2) Never done  . DEXA SCAN  06/09/2016  . COVID-19 Vaccine (3 - Moderna risk 4-dose series) 05/16/2019    There are no preventive care reminders to display for this patient.   No results found for: TSH Lab Results  Component Value Date   WBC 5.6 01/12/2020   HGB 14.7 01/12/2020   HCT 42.4 01/12/2020   MCV 91 01/12/2020   PLT 258 01/12/2020   Lab Results  Component Value Date   NA  146 (H) 01/12/2020   K 4.9 01/12/2020   CO2 25 01/12/2020   GLUCOSE 105 (H) 01/12/2020   BUN 18 01/12/2020   CREATININE 0.97 01/12/2020   BILITOT 0.7 01/12/2020   ALKPHOS 50 01/12/2020   AST 17 01/12/2020   ALT 20 01/12/2020   PROT 6.8 01/12/2020   ALBUMIN 5.1 (  H) 01/12/2020   CALCIUM 10.5 (H) 01/12/2020   Lab Results  Component Value Date   CHOL 201 (H) 01/12/2020   Lab Results  Component Value Date   HDL 54 01/12/2020   Lab Results  Component Value Date   LDLCALC 125 (H) 01/12/2020   Lab Results  Component Value Date   TRIG 123 01/12/2020   Lab Results  Component Value Date   CHOLHDL 3.7 01/12/2020   No results found for: HGBA1C     Assessment & Plan:   Problem List Items Addressed This Visit   None   Visit Diagnoses    Diverticulitis of large intestine without perforation or abscess without bleeding    -  Primary   Relevant Medications   metroNIDAZOLE (FLAGYL) 500 MG tablet   ciprofloxacin (CIPRO) 500 MG tablet       Meds ordered this encounter  Medications  . metroNIDAZOLE (FLAGYL) 500 MG tablet    Sig: Take 1 tablet (500 mg total) by mouth 2 (two) times daily.    Dispense:  20 tablet    Refill:  0    Order Specific Question:   Supervising Provider    Answer:   Janora Norlander [[id number]]  . ciprofloxacin (CIPRO) 500 MG tablet    Sig: Take 1 tablet (500 mg total) by mouth 2 (two) times daily for 10 days.    Dispense:  20 tablet    Refill:  0    Order Specific Question:   Supervising Provider    Answer:   Janora Norlander [[id number]]     Ivy Lynn, NP

## 2020-06-25 ENCOUNTER — Other Ambulatory Visit: Payer: Self-pay

## 2020-06-25 ENCOUNTER — Ambulatory Visit (INDEPENDENT_AMBULATORY_CARE_PROVIDER_SITE_OTHER): Payer: PPO | Admitting: Nurse Practitioner

## 2020-06-25 ENCOUNTER — Encounter: Payer: Self-pay | Admitting: Nurse Practitioner

## 2020-06-25 VITALS — BP 164/73 | HR 60 | Temp 98.2°F | Resp 20 | Ht 64.0 in | Wt 170.0 lb

## 2020-06-25 DIAGNOSIS — R1011 Right upper quadrant pain: Secondary | ICD-10-CM | POA: Diagnosis not present

## 2020-06-25 NOTE — Progress Notes (Signed)
   Subjective:    Patient ID: Gabriela Wilson, female    DOB: August 20, 1945, 75 y.o.   MRN: [id number]   Chief Complaint: Gastroesophageal Reflux and Abdominal Pain   HPI Patient had flare up of Diverticulitis for 2 time in a month. She was given cipro and flagyl. Now she is having nausea and indigestion with right upper quadrant pan. She has not been eating much so not sure if eating affects it.    Review of Systems  Constitutional: Negative for diaphoresis.  Eyes: Negative for pain.  Respiratory: Negative for shortness of breath.   Cardiovascular: Negative for chest pain, palpitations and leg swelling.  Gastrointestinal: Positive for abdominal pain (RUQ) and nausea.  Endocrine: Negative for polydipsia.  Skin: Negative for rash.  Neurological: Negative for dizziness, weakness and headaches.  Hematological: Does not bruise/bleed easily.  All other systems reviewed and are negative.      Objective:   Physical Exam Vitals and nursing note reviewed.  Constitutional:      Appearance: She is well-developed.  Cardiovascular:     Rate and Rhythm: Normal rate and regular rhythm.  Pulmonary:     Effort: Pulmonary effort is normal.     Breath sounds: Normal breath sounds.  Abdominal:     General: Abdomen is flat. Bowel sounds are normal. There is no distension.     Palpations: Abdomen is soft.     Tenderness: There is abdominal tenderness in the right upper quadrant. Positive signs include Murphy's sign.  Neurological:     Mental Status: She is alert.    BP (!) 164/73   Pulse 60   Temp 98.2 F (36.8 C) (Temporal)   Resp 20   Ht 5\' 4"  (1.626 m)   Wt 170 lb (77.1 kg)   SpO2 98%   BMI 29.18 kg/m       Assessment & Plan:  Gabriela Wilson in today with chief complaint of Gastroesophageal Reflux and Abdominal Pain   1. RUQ pain Avoid spicy and fatty foods Will call with test results once received. - US Abdomen Limited RUQ (LIVER/GB); Future    The above assessment and  management plan was discussed with the patient. The patient verbalized understanding of and has agreed to the management plan. Patient is aware to call the clinic if symptoms persist or worsen. Patient is aware when to return to the clinic for a follow-up visit. Patient educated on when it is appropriate to go to the emergency department.   Mary-Margaret Hassell Done, FNP

## 2020-06-25 NOTE — Patient Instructions (Signed)
Cholecystitis  Cholecystitis is irritation and swelling (inflammation) of the gallbladder. The gallbladder is an organ that is shaped like a pear. It is under the liver on the right side of the body. This organ stores bile. Bile helps the body break down (digest) the fats in food. This condition can occur all of a sudden. It needs to be treated. What are the causes? This condition may be caused by stones or lumps that form in the gallbladder (gallstones). Gallstones can block the tube (duct) that carries bile out of your gallbladder. Other causes are:  Damage to the gallbladder due to less blood flow.  Germs in the bile ducts.  Scars or kinks in the bile ducts.  Abnormal growths (tumors) in the liver, pancreas, or gallbladder. What increases the risk? You are more likely to develop this condition if:  You have sickle cell disease.  You take birth control pills.  You use estrogen.  You have alcoholic liver disease.  You have liver cirrhosis.  You are being fed through a vein.  You are very ill.  You do not eat or drink for a long time. This is also called "fasting."  You are overweight (obese).  You lose weight too fast.  You are pregnant.  You have high levels of fat in the blood (triglycerides).  You have irritation and swelling of the pancreas (pancreatitis). What are the signs or symptoms? Symptoms of this condition include:  Pain in the belly (abdomen). Pain is often in the upper right area of the belly.  Tenderness or bloating in the belly.  Feeling sick to your stomach (nauseous).  Throwing up (vomiting).  Fever.  Chills. How is this diagnosed? This condition may be diagnosed with a medical history and exam. You may also have other tests, such as:  Imaging tests. This may include: ? Ultrasound. ? CT scan of the belly. ? Nuclear scan. This is also called a HIDA scan. This scan lets your doctor see the bile as it moves in your body. ? MRI.  Blood  tests. These are done to check: ? Your blood count. The white blood cell count may be higher than normal. ? How well your liver works.   How is this treated? This condition may be treated with:  Surgery to take out your gallbladder.  Antibiotic medicines to treat illnesses caused by germs.  Going without food for some time.  Giving fluids through an IV tube.  Medicines to treat pain or throwing up. Follow these instructions at home:  If you had surgery, follow instructions from your doctor about how to care for yourself after you go home. Medicines  Take over-the-counter and prescription medicines only as told by your doctor.  If you were prescribed an antibiotic medicine, take it as told by your doctor. Do not stop taking it even if you start to feel better.   General instructions  Follow instructions from your doctor about what to eat or drink. Do not eat or drink anything that makes you sick again.  Do not lift anything that is heavier than 10 lb (4.5 kg) until your doctor says that it is safe.  Do not use any products that contain nicotine or tobacco, such as cigarettes and e-cigarettes. If you need help quitting, ask your doctor.  Keep all follow-up visits as told by your doctor. This is important. Contact a doctor if:  You have pain and your medicine does not help.  You have a fever. Get help right  away if:  Your pain moves to: ? Another part of your belly. ? Your back.  Your symptoms do not go away.  You have new symptoms. Summary  Cholecystitis is swelling and irritation of the gallbladder.  This condition may be caused by stones or lumps that form in the gallbladder (gallstones).  Common symptoms are pain in the belly. You may feel sick to your stomach and start throwing up. You may also have a fever and chills.  This condition may be treated with surgery to take out the gallbladder. It may also be treated with medicines, fasting, and fluids through an  IV tube.  Follow what you are told about eating and drinking. Do not eat things that make you sick again. This information is not intended to replace advice given to you by your health care provider. Make sure you discuss any questions you have with your health care provider. Document Revised: 05/18/2017 Document Reviewed: 05/18/2017 Elsevier Patient Education  [employer].

## 2020-06-30 ENCOUNTER — Ambulatory Visit (INDEPENDENT_AMBULATORY_CARE_PROVIDER_SITE_OTHER): Payer: PPO

## 2020-06-30 DIAGNOSIS — Z0000 Encounter for general adult medical examination without abnormal findings: Secondary | ICD-10-CM | POA: Diagnosis not present

## 2020-06-30 NOTE — Progress Notes (Signed)
MEDICARE ANNUAL WELLNESS VISIT  06/30/2020  Telephone Visit Disclaimer This Medicare AWV was conducted by telephone due to national recommendations for restrictions regarding the COVID-19 Pandemic (e.g. social distancing).  I verified, using two identifiers, that I am speaking with Gabriela Wilson or their authorized healthcare agent. I discussed the limitations, risks, security, and privacy concerns of performing an evaluation and management service by telephone and the potential availability of an in-person appointment in the future. The patient expressed understanding and agreed to proceed.  Location of Patient: Home Location of Provider (nurse):  [facility]  Subjective:    Gabriela Wilson is a 75 y.o. female patient of Chevis Pretty, [place] who had a Medicare Annual Wellness Visit today via telephone. Gabriela Wilson is Retired and lives with their spouse. She has three children. She reports that she is socially active and does interact with friends/family regularly. She is minimally physically active and enjoys working on crafts and spending time with family.  Patient Care Team: Chevis Pretty, FNP as PCP - General (Nurse Practitioner) Lavonna Monarch, MD as Consulting Physician (Dermatology)  Advanced Directives 06/30/2020 05/02/2017 07/31/2014 06/10/2014  Does Patient Have a Medical Advance Directive? No No No No  Would patient like information on creating a medical advance directive? No - Patient declined Yes (ED - Information included in AVS) Yes - Educational materials given Yes - Scientist, clinical (histocompatibility and immunogenetics) given;No - patient declined information    Hospital Utilization Over the Past 12 Months: # of hospitalizations or ER visits: 0 # of surgeries: 0  Review of Systems    Patient reports that her overall health is unchanged compared to last year.  History obtained from the patient  Patient Reported Readings (BP, Pulse, CBG, Weight, etc) none  Pain Assessment Pain : No/denies pain      Current Medications & Allergies (verified) Allergies as of 06/30/2020   No Known Allergies     Medication List       Accurate as of June 30, 2020 11:21 AM. If you have any questions, ask your nurse or doctor.        Advair Diskus 100-50 MCG/ACT Aepb Generic drug: fluticasone-salmeterol INHALE 1 DOSE BY MOUTH TWICE DAILY   albuterol 108 (90 Base) MCG/ACT inhaler Commonly known as: ProAir HFA Inhale 2 puffs into the lungs every 6 (six) hours as needed.   ALPRAZolam 0.5 MG tablet Commonly known as: XANAX Take 1 tablet (0.5 mg total) by mouth daily.   Ipratropium-Albuterol 20-100 MCG/ACT Aers respimat Commonly known as: COMBIVENT Inhale 1 puff into the lungs every 6 (six) hours.   lisinopril-hydrochlorothiazide 20-12.5 MG tablet Commonly known as: Zestoretic Take 2 tablets by mouth daily.   metroNIDAZOLE 500 MG tablet Commonly known as: Flagyl Take 1 tablet (500 mg total) by mouth 2 (two) times daily.   multivitamin with minerals Tabs tablet Take 1 tablet by mouth daily.   omeprazole 20 MG capsule Commonly known as: PRILOSEC Take 1 capsule (20 mg total) by mouth daily.       History (reviewed): Past Medical History:  Diagnosis Date  . Asthma   . COPD (chronic obstructive pulmonary disease) ([place])   . Hyperlipidemia   . Hypertension   . Osteopenia    Past Surgical History:  Procedure Laterality Date  . TUBAL LIGATION     Family History  Problem Relation Age of Onset  . Asthma Mother   . Hypertension Mother   . Dementia Mother   . Diabetes Father   . COPD Father   .  Diabetes Sister   . Hypertension Sister   . Diabetes Brother   . Early death Brother        MVA with quadrapelgic  . Diabetes Brother   . Dementia Brother   . Diabetes Brother   . Cancer Sister        vulva  . Colon cancer Neg Hx    Social History   Socioeconomic History  . Marital status: Married    Spouse name: Not on file  . Number of children: 3  . Years of education: Not  on file  . Highest education level: 11th grade  Occupational History    Employer: [employer]    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  Tobacco Use  . Smoking status: Former Smoker    Quit date: 01/24/1992    Years since quitting: 28.4  . Smokeless tobacco: Never Used  Vaping Use  . Vaping Use: Never used  Substance and Sexual Activity  . Alcohol use: Yes    Comment: wine about every 2 weeks  . Drug use: No  . Sexual activity: Yes  Other Topics Concern  . Not on file  Social History Narrative  . Not on file   Social Determinants of Health   Financial Resource Strain: Not on file  Food Insecurity: Not on file  Transportation Needs: Not on file  Physical Activity: Not on file  Stress: Not on file  Social Connections: Not on file    Activities of Daily Living In your present state of health, do you have any difficulty performing the following activities: 06/30/2020 01/12/2020  Hearing? N N  Vision? N N  Difficulty concentrating or making decisions? N N  Walking or climbing stairs? N N  Dressing or bathing? N N  Doing errands, shopping? N N  Preparing Food and eating ? N -  Using the Toilet? N -  In the past six months, have you accidently leaked urine? N -  Do you have problems with loss of bowel control? N -  Managing your Medications? N -  Managing your Finances? N -  Housekeeping or managing your Housekeeping? N -  Some recent data might be hidden    Patient Education/ Literacy How often do you need to have someone help you when you read instructions, pamphlets, or other written materials from your doctor or pharmacy?: 1 - Never What is the last grade level you completed in school?: 11th grade  Exercise Current Exercise Habits: Home exercise routine, Type of exercise: walking, Time (Minutes): 30, Frequency (Times/Week): 4, Weekly Exercise (Minutes/Week): 120, Intensity: Mild, Exercise limited by: None identified  Diet Patient reports consuming 3 meals a day and 0 snack(s) a  day Patient reports that her primary diet is: Regular Patient reports that she does have regular access to food.   Depression Screen PHQ 2/9 Scores 06/30/2020 06/25/2020 06/16/2020 01/12/2020 07/08/2019 01/07/2019 07/15/2018  PHQ - 2 Score 0 0 0 0 0 0 0  Exception Documentation - - - - - - -     Fall Risk Fall Risk  06/30/2020 06/25/2020 06/16/2020 01/12/2020 07/08/2019  Falls in the past year? 0 0 0 0 0  Follow up Falls evaluation completed - - - -     Objective:  Gabriela Wilson seemed alert and oriented and she participated appropriately during our telephone visit.  Blood Pressure Weight BMI  BP Readings from Last 3 Encounters:  06/25/20 (!) 164/73  06/16/20 119/64  01/12/20 (!) 177/74   Wt Readings from Last  3 Encounters:  06/25/20 170 lb (77.1 kg)  06/16/20 171 lb (77.6 kg)  01/12/20 174 lb (78.9 kg)   BMI Readings from Last 1 Encounters:  06/25/20 29.18 kg/m    *Unable to obtain current vital signs, weight, and BMI due to telephone visit type  Hearing/Vision  . Gabriela Wilson did not seem to have difficulty with hearing/understanding during the telephone conversation . Reports that she has not had a formal eye exam by an eye care professional within the past year . Reports that she has not had a formal hearing evaluation within the past year *Unable to fully assess hearing and vision during telephone visit type  Cognitive Function: 6CIT Screen 06/30/2020  What Year? 0 points  What month? 0 points  What time? 0 points  Count back from 20 0 points  Months in reverse 0 points  Repeat phrase 0 points  Total Score 0   (Normal:0-7, Significant for Dysfunction: >8)  Normal Cognitive Function Screening: Yes   Immunization & Health Maintenance Record Immunization History  Administered Date(s) Administered  . Fluad Quad(high Dose 65+) 11/22/2018, 10/13/2019  . Influenza, High Dose Seasonal PF 10/20/2015, 11/14/2016, 12/17/2017  . Influenza,inj,Quad PF,6+ Mos 12/13/2012, 11/18/2013,  11/24/2014  . Moderna Sars-Covid-2 Vaccination 03/20/2019, 04/18/2019    Health Maintenance  Topic Date Due  . DEXA SCAN  06/09/2016  . TETANUS/TDAP  07/07/2020 (Originally 01/23/2013)  . PNA vac Low Risk Adult (1 of 2 - PCV13) 07/07/2020 (Originally 06/01/2011)  . COVID-19 Vaccine (3 - Moderna risk 4-dose series) 07/11/2020 (Originally 05/16/2019)  . Pneumococcal Vaccine 59-41 Years old (1 of 4 - PCV13) 09/24/2020 (Originally 05/31/1951)  . Zoster Vaccines- Shingrix (1 of 2) 09/25/2020 (Originally 05/31/1996)  . INFLUENZA VACCINE  08/23/2020  . COLONOSCOPY (Pts 45-31yrs Insurance coverage will need to be confirmed)  10/27/2020  . MAMMOGRAM  11/20/2020  . Hepatitis C Screening  Completed  . HPV VACCINES  Aged Out       Assessment  This is a routine wellness examination for Gabriela Wilson.  Health Maintenance: Due or Overdue Health Maintenance Due  Topic Date Due  . DEXA SCAN  06/09/2016    Gabriela Wilson does not need a referral for Community Assistance: Care Management:   no Social Work:    no Prescription Assistance:  no Nutrition/Diabetes Education:  no   Plan:  Personalized Goals Goals Addressed            This Visit's Progress   . Patient Stated       06/30/2020 AWV Goal: Fall Prevention  . Over the next year, patient will decrease their risk for falls by: o Using assistive devices, such as a cane or walker, as needed o Identifying fall risks within their home and correcting them by: - Removing throw rugs - Adding handrails to stairs or ramps - Removing clutter and keeping a clear pathway throughout the home - Increasing light, especially at night - Adding shower handles/bars - Raising toilet seat o Identifying potential personal risk factors for falls: - Medication side effects - Incontinence/urgency - Vestibular dysfunction - Hearing loss - Musculoskeletal disorders - Neurological disorders - Orthostatic hypotension        Personalized Health Maintenance  & Screening Recommendations  Pneumococcal vaccine  Bone densitometry screening  Lung Cancer Screening Recommended: yes (Low Dose CT Chest recommended if Age 12-80 years, 30 pack-year currently smoking OR have quit w/in past 15 years) Hepatitis C Screening recommended: no HIV Screening recommended: no  Advanced Directives: Written  information was not prepared per patient's request.  Referrals & Orders No orders of the defined types were placed in this encounter.   Follow-up Plan . Follow-up with Chevis Pretty, FNP as planned . Schedule Dexa scan    I have personally reviewed and noted the following in the patient's chart:   . Medical and social history . Use of alcohol, tobacco or illicit drugs  . Current medications and supplements . Functional ability and status . Nutritional status . Physical activity . Advanced directives . List of other physicians . Hospitalizations, surgeries, and ER visits in previous 12 months . Vitals . Screenings to include cognitive, depression, and falls . Referrals and appointments  In addition, I have reviewed and discussed with Gabriela Wilson certain preventive protocols, quality metrics, and best practice recommendations. A written personalized care plan for preventive services as well as general preventive health recommendations is available and can be mailed to the patient at her request.      Felicity Coyer, LPN    [id number]/03/[id number]

## 2020-07-02 ENCOUNTER — Other Ambulatory Visit: Payer: Self-pay | Admitting: Nurse Practitioner

## 2020-07-02 DIAGNOSIS — K219 Gastro-esophageal reflux disease without esophagitis: Secondary | ICD-10-CM

## 2020-07-02 DIAGNOSIS — I10 Essential (primary) hypertension: Secondary | ICD-10-CM

## 2020-07-08 ENCOUNTER — Other Ambulatory Visit: Payer: Self-pay

## 2020-07-08 ENCOUNTER — Ambulatory Visit (HOSPITAL_COMMUNITY)
Admission: RE | Admit: 2020-07-08 | Discharge: 2020-07-08 | Disposition: A | Discharge status: Home / Self Care | Payer: PPO | Source: Ambulatory Visit | Attending: Nurse Practitioner | Admitting: Nurse Practitioner

## 2020-07-08 DIAGNOSIS — R1011 Right upper quadrant pain: Secondary | ICD-10-CM | POA: Insufficient documentation

## 2020-07-08 DIAGNOSIS — R109 Unspecified abdominal pain: Secondary | ICD-10-CM | POA: Diagnosis not present

## 2020-07-12 ENCOUNTER — Encounter: Payer: Self-pay | Admitting: Nurse Practitioner

## 2020-07-12 ENCOUNTER — Other Ambulatory Visit: Payer: Self-pay

## 2020-07-12 ENCOUNTER — Ambulatory Visit (INDEPENDENT_AMBULATORY_CARE_PROVIDER_SITE_OTHER): Payer: PPO | Admitting: Nurse Practitioner

## 2020-07-12 VITALS — BP 153/72 | HR 64 | Temp 97.8°F | Resp 20 | Ht 64.0 in | Wt 166.0 lb

## 2020-07-12 DIAGNOSIS — I10 Essential (primary) hypertension: Secondary | ICD-10-CM | POA: Diagnosis not present

## 2020-07-12 DIAGNOSIS — K5732 Diverticulitis of large intestine without perforation or abscess without bleeding: Secondary | ICD-10-CM | POA: Diagnosis not present

## 2020-07-12 DIAGNOSIS — R1011 Right upper quadrant pain: Secondary | ICD-10-CM | POA: Diagnosis not present

## 2020-07-12 DIAGNOSIS — K219 Gastro-esophageal reflux disease without esophagitis: Secondary | ICD-10-CM

## 2020-07-12 DIAGNOSIS — Z6829 Body mass index (BMI) 29.0-29.9, adult: Secondary | ICD-10-CM

## 2020-07-12 DIAGNOSIS — F410 Panic disorder [episodic paroxysmal anxiety] without agoraphobia: Secondary | ICD-10-CM

## 2020-07-12 DIAGNOSIS — J439 Emphysema, unspecified: Secondary | ICD-10-CM | POA: Diagnosis not present

## 2020-07-12 DIAGNOSIS — F411 Generalized anxiety disorder: Secondary | ICD-10-CM | POA: Diagnosis not present

## 2020-07-12 DIAGNOSIS — M8588 Other specified disorders of bone density and structure, other site: Secondary | ICD-10-CM

## 2020-07-12 DIAGNOSIS — E785 Hyperlipidemia, unspecified: Secondary | ICD-10-CM

## 2020-07-12 LAB — CBC WITH DIFFERENTIAL/PLATELET
Basophils Absolute: 0.1 10*3/uL (ref 0.0–0.2)
Basos: 1 %
EOS (ABSOLUTE): 0.2 10*3/uL (ref 0.0–0.4)
Eos: 3 %
Hematocrit: 41.8 % (ref 34.0–46.6)
Hemoglobin: 14.6 g/dL (ref 11.1–15.9)
Immature Grans (Abs): 0 10*3/uL (ref 0.0–0.1)
Immature Granulocytes: 0 %
Lymphocytes Absolute: 1.7 10*3/uL (ref 0.7–3.1)
Lymphs: 26 %
MCH: 31.7 pg (ref 26.6–33.0)
MCHC: 34.9 g/dL (ref 31.5–35.7)
MCV: 91 fL (ref 79–97)
Monocytes Absolute: 0.5 10*3/uL (ref 0.1–0.9)
Monocytes: 7 %
Neutrophils Absolute: 4.2 10*3/uL (ref 1.4–7.0)
Neutrophils: 63 %
Platelets: 281 10*3/uL (ref 150–450)
RBC: 4.61 x10E6/uL (ref 3.77–5.28)
RDW: 12.3 % (ref 11.7–15.4)
WBC: 6.6 10*3/uL (ref 3.4–10.8)

## 2020-07-12 LAB — CMP14+EGFR
ALT: 16 IU/L (ref 0–32)
AST: 14 IU/L (ref 0–40)
Albumin/Globulin Ratio: 2.2 (ref 1.2–2.2)
Albumin: 4.8 g/dL — ABNORMAL HIGH (ref 3.7–4.7)
Alkaline Phosphatase: 55 IU/L (ref 44–121)
BUN/Creatinine Ratio: 9 — ABNORMAL LOW (ref 12–28)
BUN: 10 mg/dL (ref 8–27)
Bilirubin Total: 0.8 mg/dL (ref 0.0–1.2)
CO2: 25 mmol/L (ref 20–29)
Calcium: 10.7 mg/dL — ABNORMAL HIGH (ref 8.7–10.3)
Chloride: 96 mmol/L (ref 96–106)
Creatinine, Ser: 1.12 mg/dL — ABNORMAL HIGH (ref 0.57–1.00)
Globulin, Total: 2.2 g/dL (ref 1.5–4.5)
Glucose: 104 mg/dL — ABNORMAL HIGH (ref 65–99)
Potassium: 4.3 mmol/L (ref 3.5–5.2)
Sodium: 136 mmol/L (ref 134–144)
Total Protein: 7 g/dL (ref 6.0–8.5)
eGFR: 52 mL/min/{1.73_m2} — ABNORMAL LOW (ref >59)

## 2020-07-12 LAB — LIPID PANEL
Chol/HDL Ratio: 4.3 ratio (ref 0.0–4.4)
Cholesterol, Total: 207 mg/dL — ABNORMAL HIGH (ref 100–199)
HDL: 48 mg/dL (ref >39)
LDL Chol Calc (NIH): 127 mg/dL — ABNORMAL HIGH (ref 0–99)
Triglycerides: 182 mg/dL — ABNORMAL HIGH (ref 0–149)
VLDL Cholesterol Cal: 32 mg/dL (ref 5–40)

## 2020-07-12 MED ORDER — LISINOPRIL-HYDROCHLOROTHIAZIDE 20-12.5 MG PO TABS: 2.0000 | ORAL_TABLET | Freq: Every day | ORAL | 1 refills | Status: DC

## 2020-07-12 MED ORDER — AMLODIPINE BESYLATE 5 MG PO TABS: 5.0000 mg | ORAL_TABLET | Freq: Every day | ORAL | 1 refills | Status: DC

## 2020-07-12 MED ORDER — FLUTICASONE-SALMETEROL 100-50 MCG/ACT IN AEPB: INHALATION_SPRAY | RESPIRATORY_TRACT | 1 refills | Status: DC

## 2020-07-12 MED ORDER — ALPRAZOLAM 0.5 MG PO TABS: 0.5000 mg | ORAL_TABLET | Freq: Every day | ORAL | 5 refills | Status: DC

## 2020-07-12 MED ORDER — OMEPRAZOLE 20 MG PO CPDR: 1.0000 | DELAYED_RELEASE_CAPSULE | Freq: Every day | ORAL | 1 refills | Status: DC

## 2020-07-12 NOTE — Progress Notes (Signed)
Subjective:    Patient ID: Gabriela Wilson, female    DOB: Jan 21, 1946, 75 y.o.   MRN: [id number]   Chief Complaint: Medical Management of Chronic Issues    HPI:  1. Essential hypertension, benign No c/o chest pain, sob or headache. Does  check blood pressure at home. Blood pressure at home systolic runns 256-389'H. BP Readings from Last 3 Encounters:  06/25/20 (!) 164/73  06/16/20 119/64  01/12/20 (!) 177/74     2. Pulmonary emphysema, unspecified emphysema type ([place]) Has occasional sob, bit overall is doing well.she is on Advair daily and does not need albuterol very often.  3. Diverticulitis of large intestine without perforation or abscess without bleeding No recent  flare ups  4. Gastroesophageal reflux disease, unspecified whether esophagitis present Is on omeprazole daily and is doing well.  5. Hyperlipidemia with target LDL less than 100 Does try to watch diet but does no dedicated exercise. Lab Results  Component Value Date   CHOL 201 (H) 01/12/2020   HDL 54 01/12/2020   LDLCALC 125 (H) 01/12/2020   TRIG 123 01/12/2020   CHOLHDL 3.7 01/12/2020     6. PANIC DISORDER Is on xanax as needed and usually has to take 1 a day. Usually lat ein afternoon. GAD 7 : Generalized Anxiety Score 07/12/2020 01/12/2020 07/08/2019 01/07/2019  Nervous, Anxious, on Edge 0 1 1 0  Control/stop worrying 0 0 0 0  Worry too much - different things 0 0 0 0  Trouble relaxing 0 1 1 0  Restless 0 0 0 0  Easily annoyed or irritable 0 0 0 0  Afraid - awful might happen 0 0 0 0  Total GAD 7 Score 0 2 2 0  Anxiety Difficulty Not difficult at all Not difficult at all Not difficult at all Not difficult at all      7. GAD (generalized anxiety disorder) Se not eon panic disorder. Sh eis doing well.  8. Osteopenia of lumbar spine Last dexascan was in 2016. Patient no longer wanted to do these.  9. BMI 29.0-29.9,adult Weight is down 4lbs Wt Readings from Last 3 Encounters:  07/12/20  166 lb (75.3 kg)  06/25/20 170 lb (77.1 kg)  06/16/20 171 lb (77.6 kg)   BMI Readings from Last 3 Encounters:  07/12/20 28.49 kg/m  06/25/20 29.18 kg/m  06/16/20 29.35 kg/m       Outpatient Encounter Medications as of 07/12/2020  Medication Sig   ADVAIR DISKUS 100-50 MCG/DOSE AEPB INHALE 1 DOSE BY MOUTH TWICE DAILY   albuterol (PROAIR HFA) 108 (90 Base) MCG/ACT inhaler Inhale 2 puffs into the lungs every 6 (six) hours as needed.   ALPRAZolam (XANAX) 0.5 MG tablet Take 1 tablet (0.5 mg total) by mouth daily.   Ipratropium-Albuterol (COMBIVENT) 20-100 MCG/ACT AERS respimat Inhale 1 puff into the lungs every 6 (six) hours.   lisinopril-hydrochlorothiazide (ZESTORETIC) 20-12.5 MG tablet Take 2 tablets by mouth once daily   metroNIDAZOLE (FLAGYL) 500 MG tablet Take 1 tablet (500 mg total) by mouth 2 (two) times daily.   Multiple Vitamin (MULTIVITAMIN WITH MINERALS) TABS tablet Take 1 tablet by mouth daily.   omeprazole (PRILOSEC) 20 MG capsule Take 1 capsule by mouth once daily   No facility-administered encounter medications on file as of 07/12/2020.    Past Surgical History:  Procedure Laterality Date   TUBAL LIGATION      Family History  Problem Relation Age of Onset   Asthma Mother    Hypertension Mother  Dementia Mother    Diabetes Father    COPD Father    Diabetes Sister    Hypertension Sister    Diabetes Brother    Early death Brother        MVA with quadrapelgic   Diabetes Brother    Dementia Brother    Diabetes Brother    Cancer Sister        vulva   Colon cancer Neg Hx     New complaints: Patient was seen with gallbladder symptoms last month. Gall bladder U/s s ordered and was clear except for fatty liver. She does c/o nausea 2-3 times a week. Not sure if det related. She is still having RUQ pain at times. She had RUQ yesterday after eating a hamburger.  Social history: Lives her husband  Controlled substance contract: 01/13/20      Review of  Systems     Objective:   Physical Exam Vitals and nursing note reviewed.  Constitutional:      General: She is not in acute distress.    Appearance: Normal appearance. She is well-developed.  HENT:     Head: Normocephalic.     Right Ear: Tympanic membrane normal.     Left Ear: Tympanic membrane normal.     Nose: Nose normal.     Mouth/Throat:     Mouth: Mucous membranes are moist.  Eyes:     Pupils: Pupils are equal, round, and reactive to light.  Neck:     Vascular: No carotid bruit or JVD.  Cardiovascular:     Rate and Rhythm: Normal rate and regular rhythm.     Heart sounds: Normal heart sounds.  Pulmonary:     Effort: Pulmonary effort is normal. No respiratory distress.     Breath sounds: Normal breath sounds. No wheezing or rales.  Chest:     Chest wall: No tenderness.  Abdominal:     General: Bowel sounds are normal. There is no distension or abdominal bruit.     Palpations: Abdomen is soft. There is no hepatomegaly, splenomegaly, mass or pulsatile mass.     Tenderness: There is no abdominal tenderness. There is no guarding.  Musculoskeletal:        General: Normal range of motion.     Cervical back: Normal range of motion and neck supple.  Lymphadenopathy:     Cervical: No cervical adenopathy.  Skin:    General: Skin is warm and dry.  Neurological:     Mental Status: She is alert and oriented to person, place, and time.     Deep Tendon Reflexes: Reflexes are normal and symmetric.  Psychiatric:        Behavior: Behavior normal.        Thought Content: Thought content normal.        Judgment: Judgment normal.    BP (!) 153/72   Pulse 64   Temp 97.8 F (36.6 C) (Temporal)   Resp 20   Ht 5' 4"  (1.626 m)   Wt 166 lb (75.3 kg)   SpO2 98%   BMI 28.49 kg/m         Assessment & Plan:  Gabriela Wilson comes in today with chief complaint of Medical Management of Chronic Issues   Diagnosis and orders addressed:  1. Essential hypertension, benign Low  sodium diet - lisinopril-hydrochlorothiazide (ZESTORETIC) 20-12.5 MG tablet; Take 2 tablets by mouth daily.  Dispense: 180 tablet; Refill: 1 - amLODipine (NORVASC) 5 MG tablet; Take 1 tablet (5 mg total)  by mouth daily.  Dispense: 90 tablet; Refill: 1 - CBC with Differential/Platelet - CMP14+EGFR  2. Pulmonary emphysema, unspecified emphysema type ([place]) Continue advair daily - fluticasone-salmeterol (ADVAIR DISKUS) 100-50 MCG/ACT AEPB; INHALE 1 DOSE BY MOUTH TWICE DAILY  Dispense: 180 each; Refill: 1  3. Diverticulitis of large intestine without perforation or abscess without bleeding Watch d to prevent flare up  4. Gastroesophageal reflux disease, unspecified whether esophagitis present Avoid spicy foods Do not eat 2 hours prior to bedtime - omeprazole (PRILOSEC) 20 MG capsule; Take 1 capsule (20 mg total) by mouth daily.  Dispense: 90 capsule; Refill: 1  5. Hyperlipidemia with target LDL less than 100 Low fat diet - Lipid panel  6. PANIC DISORDER Stress managemetn  7. GAD (generalized anxiety disorder) - ALPRAZolam (XANAX) 0.5 MG tablet; Take 1 tablet (0.5 mg total) by mouth daily.  Dispense: 30 tablet; Refill: 5  8. Osteopenia of lumbar spine Weight bearing exercises Refuses to do dexascan  9. BMI 29.0-29.9,adult Discussed diet and exercise for person with BMI >25 Will recheck weight in 3-6 months  10. RUQ pain Avoid fatty foods - NM Hepato W/Eject Fract; Future   Labs pending Health Maintenance reviewed Diet and exercise encouraged  Follow up plan: 6 months   Mary-Margaret Hassell Done, FNP

## 2020-07-12 NOTE — Patient Instructions (Signed)

## 2020-07-14 ENCOUNTER — Telehealth: Payer: Self-pay | Admitting: Nurse Practitioner

## 2020-07-14 ENCOUNTER — Encounter: Payer: Self-pay | Admitting: *Deleted

## 2020-07-16 ENCOUNTER — Other Ambulatory Visit: Payer: Self-pay

## 2020-07-16 ENCOUNTER — Encounter (HOSPITAL_COMMUNITY): Payer: Self-pay

## 2020-07-16 ENCOUNTER — Encounter (HOSPITAL_COMMUNITY)
Admission: RE | Admit: 2020-07-16 | Discharge: 2020-07-16 | Disposition: A | Discharge status: Home / Self Care | Payer: PPO | Source: Ambulatory Visit | Attending: Nurse Practitioner | Admitting: Nurse Practitioner

## 2020-07-16 DIAGNOSIS — R1011 Right upper quadrant pain: Secondary | ICD-10-CM | POA: Diagnosis not present

## 2020-07-16 DIAGNOSIS — R110 Nausea: Secondary | ICD-10-CM | POA: Diagnosis not present

## 2020-07-16 DIAGNOSIS — R1013 Epigastric pain: Secondary | ICD-10-CM | POA: Diagnosis not present

## 2020-07-16 MED ORDER — TECHNETIUM TC 99M MEBROFENIN IV KIT
5.0000 | PACK | Freq: Once | INTRAVENOUS | Status: AC | PRN
  Administered 2020-07-16: 5.1 via INTRAVENOUS

## 2020-08-11 ENCOUNTER — Ambulatory Visit (INDEPENDENT_AMBULATORY_CARE_PROVIDER_SITE_OTHER): Payer: PPO | Admitting: Dermatology

## 2020-08-11 ENCOUNTER — Other Ambulatory Visit: Payer: Self-pay

## 2020-08-11 ENCOUNTER — Encounter: Payer: Self-pay | Admitting: Dermatology

## 2020-08-11 DIAGNOSIS — L570 Actinic keratosis: Secondary | ICD-10-CM

## 2020-08-20 ENCOUNTER — Encounter: Payer: Self-pay | Admitting: Dermatology

## 2020-08-20 NOTE — Progress Notes (Signed)
   Follow-Up Visit   Subjective  Gabriela Wilson is a 75 y.o. female who presents for the following: Skin Problem (Patient here today to have two lesions on her forehead refroze, per patient one lesion is going away the other one is still crusty. ).   2 spots on face Location:  Duration:  Quality:  Associated Signs/Symptoms: Modifying Factors:  Severity:  Timing: Context:   Objective  Well appearing patient in no apparent distress; mood and affect are within normal limits. Left Temple, Mid Forehead (2) Tan-pink gritty crusts    A focused examination was performed including head and neck. Relevant physical exam findings are noted in the Assessment and Plan.   Assessment & Plan    AK (actinic keratosis) (3) Mid Forehead (2); Left Temple  Destruction of lesion - Left Temple, Mid Forehead Complexity: simple   Destruction method: cryotherapy   Informed consent: discussed and consent obtained   Timeout:  patient name, date of birth, surgical site, and procedure verified Lesion destroyed using liquid nitrogen: Yes   Cryotherapy cycles:  3 Outcome: patient tolerated procedure well with no complications   Post-procedure details: wound care instructions given        I, Lavonna Monarch, MD, have reviewed all documentation for this visit.  The documentation on 08/20/20 for the exam, diagnosis, procedures, and orders are all accurate and complete.

## 2020-10-07 ENCOUNTER — Other Ambulatory Visit (HOSPITAL_COMMUNITY): Payer: Self-pay | Admitting: Nurse Practitioner

## 2020-10-07 DIAGNOSIS — Z1231 Encounter for screening mammogram for malignant neoplasm of breast: Secondary | ICD-10-CM

## 2020-11-02 ENCOUNTER — Ambulatory Visit (INDEPENDENT_AMBULATORY_CARE_PROVIDER_SITE_OTHER): Payer: PPO

## 2020-11-02 ENCOUNTER — Other Ambulatory Visit: Payer: Self-pay

## 2020-11-02 DIAGNOSIS — Z23 Encounter for immunization: Secondary | ICD-10-CM | POA: Diagnosis not present

## 2020-11-22 ENCOUNTER — Ambulatory Visit (HOSPITAL_COMMUNITY)
Admission: RE | Admit: 2020-11-22 | Discharge: 2020-11-22 | Disposition: A | Discharge status: Home / Self Care | Payer: PPO | Source: Ambulatory Visit | Attending: Nurse Practitioner | Admitting: Nurse Practitioner

## 2020-11-22 ENCOUNTER — Other Ambulatory Visit: Payer: Self-pay

## 2020-11-22 DIAGNOSIS — Z1231 Encounter for screening mammogram for malignant neoplasm of breast: Secondary | ICD-10-CM | POA: Insufficient documentation

## 2020-12-28 ENCOUNTER — Other Ambulatory Visit: Payer: Self-pay | Admitting: Nurse Practitioner

## 2020-12-28 DIAGNOSIS — K219 Gastro-esophageal reflux disease without esophagitis: Secondary | ICD-10-CM

## 2020-12-28 DIAGNOSIS — I10 Essential (primary) hypertension: Secondary | ICD-10-CM

## 2021-01-11 ENCOUNTER — Encounter: Payer: Self-pay | Admitting: Nurse Practitioner

## 2021-01-11 ENCOUNTER — Ambulatory Visit (INDEPENDENT_AMBULATORY_CARE_PROVIDER_SITE_OTHER): Payer: PPO | Admitting: Nurse Practitioner

## 2021-01-11 VITALS — BP 139/52 | HR 66 | Temp 97.9°F | Resp 20 | Ht 64.0 in | Wt 175.0 lb

## 2021-01-11 DIAGNOSIS — I10 Essential (primary) hypertension: Secondary | ICD-10-CM

## 2021-01-11 DIAGNOSIS — F410 Panic disorder [episodic paroxysmal anxiety] without agoraphobia: Secondary | ICD-10-CM

## 2021-01-11 DIAGNOSIS — K5732 Diverticulitis of large intestine without perforation or abscess without bleeding: Secondary | ICD-10-CM

## 2021-01-11 DIAGNOSIS — Z6829 Body mass index (BMI) 29.0-29.9, adult: Secondary | ICD-10-CM | POA: Diagnosis not present

## 2021-01-11 DIAGNOSIS — E785 Hyperlipidemia, unspecified: Secondary | ICD-10-CM

## 2021-01-11 DIAGNOSIS — M5489 Other dorsalgia: Secondary | ICD-10-CM | POA: Diagnosis not present

## 2021-01-11 DIAGNOSIS — M8588 Other specified disorders of bone density and structure, other site: Secondary | ICD-10-CM | POA: Diagnosis not present

## 2021-01-11 DIAGNOSIS — J439 Emphysema, unspecified: Secondary | ICD-10-CM | POA: Diagnosis not present

## 2021-01-11 DIAGNOSIS — F411 Generalized anxiety disorder: Secondary | ICD-10-CM

## 2021-01-11 DIAGNOSIS — K219 Gastro-esophageal reflux disease without esophagitis: Secondary | ICD-10-CM | POA: Diagnosis not present

## 2021-01-11 LAB — LIPID PANEL
Chol/HDL Ratio: 3.7 ratio (ref 0.0–4.4)
Cholesterol, Total: 190 mg/dL (ref 100–199)
HDL: 52 mg/dL (ref >39)
LDL Chol Calc (NIH): 111 mg/dL — ABNORMAL HIGH (ref 0–99)
Triglycerides: 151 mg/dL — ABNORMAL HIGH (ref 0–149)
VLDL Cholesterol Cal: 27 mg/dL (ref 5–40)

## 2021-01-11 LAB — CMP14+EGFR
ALT: 17 IU/L (ref 0–32)
AST: 17 IU/L (ref 0–40)
Albumin/Globulin Ratio: 2.7 — ABNORMAL HIGH (ref 1.2–2.2)
Albumin: 4.9 g/dL — ABNORMAL HIGH (ref 3.7–4.7)
Alkaline Phosphatase: 57 IU/L (ref 44–121)
BUN/Creatinine Ratio: 10 — ABNORMAL LOW (ref 12–28)
BUN: 9 mg/dL (ref 8–27)
Bilirubin Total: 0.8 mg/dL (ref 0.0–1.2)
CO2: 26 mmol/L (ref 20–29)
Calcium: 10.3 mg/dL (ref 8.7–10.3)
Chloride: 101 mmol/L (ref 96–106)
Creatinine, Ser: 0.91 mg/dL (ref 0.57–1.00)
Globulin, Total: 1.8 g/dL (ref 1.5–4.5)
Glucose: 101 mg/dL — ABNORMAL HIGH (ref 70–99)
Potassium: 4.4 mmol/L (ref 3.5–5.2)
Sodium: 140 mmol/L (ref 134–144)
Total Protein: 6.7 g/dL (ref 6.0–8.5)
eGFR: 66 mL/min/{1.73_m2} (ref >59)

## 2021-01-11 LAB — CBC WITH DIFFERENTIAL/PLATELET
Basophils Absolute: 0.1 10*3/uL (ref 0.0–0.2)
Basos: 1 %
EOS (ABSOLUTE): 0.2 10*3/uL (ref 0.0–0.4)
Eos: 2 %
Hematocrit: 40.4 % (ref 34.0–46.6)
Hemoglobin: 14.1 g/dL (ref 11.1–15.9)
Immature Grans (Abs): 0 10*3/uL (ref 0.0–0.1)
Immature Granulocytes: 0 %
Lymphocytes Absolute: 1.6 10*3/uL (ref 0.7–3.1)
Lymphs: 26 %
MCH: 31.3 pg (ref 26.6–33.0)
MCHC: 34.9 g/dL (ref 31.5–35.7)
MCV: 90 fL (ref 79–97)
Monocytes Absolute: 0.4 10*3/uL (ref 0.1–0.9)
Monocytes: 6 %
Neutrophils Absolute: 4 10*3/uL (ref 1.4–7.0)
Neutrophils: 65 %
Platelets: 272 10*3/uL (ref 150–450)
RBC: 4.51 x10E6/uL (ref 3.77–5.28)
RDW: 12.5 % (ref 11.7–15.4)
WBC: 6.2 10*3/uL (ref 3.4–10.8)

## 2021-01-11 MED ORDER — LISINOPRIL-HYDROCHLOROTHIAZIDE 20-12.5 MG PO TABS: 2.0000 | ORAL_TABLET | Freq: Every day | ORAL | 1 refills | Status: DC

## 2021-01-11 MED ORDER — OMEPRAZOLE 20 MG PO CPDR: 20.0000 mg | DELAYED_RELEASE_CAPSULE | Freq: Every day | ORAL | 1 refills | Status: DC

## 2021-01-11 MED ORDER — FLUTICASONE-SALMETEROL 100-50 MCG/ACT IN AEPB: INHALATION_SPRAY | RESPIRATORY_TRACT | 1 refills | Status: DC

## 2021-01-11 MED ORDER — ALPRAZOLAM 0.5 MG PO TABS: 0.5000 mg | ORAL_TABLET | Freq: Every day | ORAL | 5 refills | Status: DC

## 2021-01-11 MED ORDER — AMLODIPINE BESYLATE 5 MG PO TABS: 5.0000 mg | ORAL_TABLET | Freq: Every day | ORAL | 1 refills | Status: DC

## 2021-01-11 NOTE — Patient Instructions (Signed)

## 2021-01-11 NOTE — Progress Notes (Signed)
Subjective:    Patient ID: Gabriela Wilson, female    DOB: 1945-03-09, 75 y.o.   MRN: [id number]    Chief Complaint: Medical Management of Chronic Issues    HPI:  Gabriela Wilson is a 75 y.o. who identifies as a female who was assigned female at birth.   Social history: Lives with: lives with husband of 9 years. Husband recently dx with brain tumor Work history: retired from [employer], physiological in today for follow up of the following chronic medical issues:  1. Essential hypertension, benign No c/o chest pain, sob or headache. Does  check blood pressure at home. Usually runs in 659'D systolic BP Readings from Last 3 Encounters:  01/11/21 (!) 178/79  07/12/20 (!) 153/72  06/25/20 (!) 164/73     2. Hyperlipidemia with target LDL less than 100 Does not watch diet and does no dedicated exercise. Does not want to take statin due to myalgia Lab Results  Component Value Date   CHOL 207 (H) 07/12/2020   HDL 48 07/12/2020   LDLCALC 127 (H) 07/12/2020   TRIG 182 (H) 07/12/2020   CHOLHDL 4.3 07/12/2020  The 10-year ASCVD risk score (Arnett DK, et al., 2019) is: 22.2%    3. Pulmonary emphysema, unspecified emphysema type ([place]) Is on advair daily and is doing well. Denies any SOB or cough  4. Diverticulitis of large intestine without perforation or abscess without bleeding No recent flare ups  5. Gastroesophageal reflux disease, unspecified whether esophagitis present Is on omeprazole daily and is doing well.  6. GAD (generalized anxiety disorder) Xanax daily and that is working well for her. GAD 7 : Generalized Anxiety Score 01/11/2021 07/12/2020 01/12/2020 07/08/2019  Nervous, Anxious, on Edge 0 0 1 1  Control/stop worrying 0 0 0 0  Worry too much - different things 0 0 0 0  Trouble relaxing 0 0 1 1  Restless 0 0 0 0  Easily annoyed or irritable 0 0 0 0  Afraid - awful might happen 0 0 0 0  Total GAD 7 Score 0 0 2 2  Anxiety Difficulty Not difficult at all Not  difficult at all Not difficult at all Not difficult at all      7. PANIC DISORDER No recent panic attacks  8. Osteopenia of lumbar spine Last dexascan was done on 2016. She refuses  to repeat those anymore.  9. Back pain without sciatica Has chronic low back pain. Just uses OTC meds as needed.  10. BMI 29.0-29.9,adult Weight is up 9 lbs Wt Readings from Last 3 Encounters:  01/11/21 175 lb (79.4 kg)  07/12/20 166 lb (75.3 kg)  06/25/20 170 lb (77.1 kg)   BMI Readings from Last 3 Encounters:  01/11/21 30.04 kg/m  07/12/20 28.49 kg/m  06/25/20 29.18 kg/m      New complaints: None today  No Known Allergies Outpatient Encounter Medications as of 01/11/2021  Medication Sig   albuterol (PROAIR HFA) 108 (90 Base) MCG/ACT inhaler Inhale 2 puffs into the lungs every 6 (six) hours as needed.   ALPRAZolam (XANAX) 0.5 MG tablet Take 1 tablet (0.5 mg total) by mouth daily.   amLODipine (NORVASC) 5 MG tablet Take 1 tablet (5 mg total) by mouth daily.   fluticasone-salmeterol (ADVAIR DISKUS) 100-50 MCG/ACT AEPB INHALE 1 DOSE BY MOUTH TWICE DAILY   Ipratropium-Albuterol (COMBIVENT) 20-100 MCG/ACT AERS respimat Inhale 1 puff into the lungs every 6 (six) hours.   lisinopril-hydrochlorothiazide (ZESTORETIC) 20-12.5 MG tablet Take 2  tablets by mouth once daily   Multiple Vitamin (MULTIVITAMIN WITH MINERALS) TABS tablet Take 1 tablet by mouth daily.   omeprazole (PRILOSEC) 20 MG capsule Take 1 capsule by mouth once daily   No facility-administered encounter medications on file as of 01/11/2021.    Past Surgical History:  Procedure Laterality Date   TUBAL LIGATION      Family History  Problem Relation Age of Onset   Asthma Mother    Hypertension Mother    Dementia Mother    Diabetes Father    COPD Father    Diabetes Sister    Hypertension Sister    Diabetes Brother    Early death Brother        MVA with quadrapelgic   Diabetes Brother    Dementia Brother    Diabetes  Brother    Cancer Sister        vulva   Colon cancer Neg Hx       Controlled substance contract: 01/11/21    Review of Systems  Constitutional:  Negative for diaphoresis.  Eyes:  Negative for pain.  Respiratory:  Negative for shortness of breath.   Cardiovascular:  Negative for chest pain, palpitations and leg swelling.  Gastrointestinal:  Negative for abdominal pain.  Endocrine: Negative for polydipsia.  Skin:  Negative for rash.  Neurological:  Negative for dizziness, weakness and headaches.  Hematological:  Does not bruise/bleed easily.  All other systems reviewed and are negative.     Objective:   Physical Exam Vitals and nursing note reviewed.  Constitutional:      General: She is not in acute distress.    Appearance: Normal appearance. She is well-developed.  HENT:     Head: Normocephalic.     Right Ear: Tympanic membrane normal.     Left Ear: Tympanic membrane normal.     Nose: Nose normal.     Mouth/Throat:     Mouth: Mucous membranes are moist.  Eyes:     Pupils: Pupils are equal, round, and reactive to light.  Neck:     Vascular: No carotid bruit or JVD.  Cardiovascular:     Rate and Rhythm: Normal rate and regular rhythm.     Heart sounds: Normal heart sounds.  Pulmonary:     Effort: Pulmonary effort is normal. No respiratory distress.     Breath sounds: Normal breath sounds. No wheezing or rales.  Chest:     Chest wall: No tenderness.  Abdominal:     General: Bowel sounds are normal. There is no distension or abdominal bruit.     Palpations: Abdomen is soft. There is no hepatomegaly, splenomegaly, mass or pulsatile mass.     Tenderness: There is no abdominal tenderness.  Musculoskeletal:        General: Normal range of motion.     Cervical back: Normal range of motion and neck supple.  Lymphadenopathy:     Cervical: No cervical adenopathy.  Skin:    General: Skin is warm and dry.  Neurological:     Mental Status: She is alert and oriented to  person, place, and time.     Deep Tendon Reflexes: Reflexes are normal and symmetric.  Psychiatric:        Behavior: Behavior normal.        Thought Content: Thought content normal.        Judgment: Judgment normal.    BP (!) 139/52 Comment: home   Pulse 66    Temp 97.9 F (36.6 C) (  Temporal)    Resp 20    Ht 5' 4"  (1.626 m)    Wt 175 lb (79.4 kg)    SpO2 98%    BMI 30.04 kg/m          Assessment & Plan:  Gabriela Wilson comes in today with chief complaint of Medical Management of Chronic Issues   Diagnosis and orders addressed:  1. Essential hypertension, benign Low sodium diet - lisinopril-hydrochlorothiazide (ZESTORETIC) 20-12.5 MG tablet; Take 2 tablets by mouth daily.  Dispense: 180 tablet; Refill: 1 - amLODipine (NORVASC) 5 MG tablet; Take 1 tablet (5 mg total) by mouth daily.  Dispense: 90 tablet; Refill: 1  2. Hyperlipidemia with target LDL less than 100 Low fat diet Refuses statin therapy  3. Pulmonary emphysema, unspecified emphysema type ([facility]) - fluticasone-salmeterol (ADVAIR DISKUS) 100-50 MCG/ACT AEPB; INHALE 1 DOSE BY MOUTH TWICE DAILY  Dispense: 180 each; Refill: 1  4. Diverticulitis of large intestine without perforation or abscess without bleeding Watch diet to prevent flare up  5. Gastroesophageal reflux disease, unspecified whether esophagitis present Avoid spicy foods Do not eat 2 hours prior to bedtime - omeprazole (PRILOSEC) 20 MG capsule; Take 1 capsule (20 mg total) by mouth daily.  Dispense: 90 capsule; Refill: 1  6. GAD (generalized anxiety disorder) Stress management - ALPRAZolam (XANAX) 0.5 MG tablet; Take 1 tablet (0.5 mg total) by mouth daily.  Dispense: 30 tablet; Refill: 5  7. PANIC DISORDER Stress management  8. Osteopenia of lumbar spine Weight bearing exercises  9. Back pain without sciatica Back stretches  10. BMI 29.0-29.9,adult Discussed diet and exercise for person with BMI >25 Will recheck weight in 3-6 months  Orders  Placed This Encounter  Procedures   CBC with Differential/Platelet   CMP14+EGFR   Lipid panel      Labs pending Health Maintenance reviewed Diet and exercise encouraged  Follow up plan: 6 months   [place], FNP

## 2021-01-24 ENCOUNTER — Encounter: Payer: Self-pay | Admitting: Gastroenterology

## 2021-02-25 ENCOUNTER — Encounter: Payer: Self-pay | Admitting: Nurse Practitioner

## 2021-02-25 ENCOUNTER — Ambulatory Visit (INDEPENDENT_AMBULATORY_CARE_PROVIDER_SITE_OTHER): Payer: PPO | Admitting: Nurse Practitioner

## 2021-02-25 VITALS — BP 150/84 | HR 80 | Temp 98.1°F | Resp 20 | Ht 64.0 in | Wt 173.0 lb

## 2021-02-25 DIAGNOSIS — K5732 Diverticulitis of large intestine without perforation or abscess without bleeding: Secondary | ICD-10-CM

## 2021-02-25 MED ORDER — CIPROFLOXACIN HCL 500 MG PO TABS: 500.0000 mg | ORAL_TABLET | Freq: Two times a day (BID) | ORAL | 0 refills | Status: DC

## 2021-02-25 MED ORDER — METRONIDAZOLE 500 MG PO TABS: 500.0000 mg | ORAL_TABLET | Freq: Two times a day (BID) | ORAL | 0 refills | Status: DC

## 2021-02-25 NOTE — Patient Instructions (Signed)
Diverticulitis Diverticulitis is when small pouches in your colon (large intestine) get infected or swollen. This causes pain in the belly (abdomen) and watery poop (diarrhea). These pouches are called diverticula. The pouches form in people who have a condition called diverticulosis. What are the causes? This condition may be caused by poop (stool) that gets trapped in the pouches in your colon. The poop lets germs (bacteria) grow in the pouches. This causes the infection. What increases the risk? You are more likely to get this condition if you have small pouches in your colon. The risk is higher if: You are overweight or very overweight (obese). You do not exercise enough. You drink alcohol. You smoke or use products with tobacco in them. You eat a diet that has a lot of red meat such as beef, pork, or lamb. You eat a diet that does not have enough fiber in it. You are older than 75 years of age. What are the signs or symptoms? Pain in the belly. Pain is often on the left side, but it may be in other areas. Fever and feeling cold. Feeling like you may vomit. Vomiting. Having cramps. Feeling full. Changes to how often you poop. Blood in your poop. How is this treated? Most cases are treated at home by: Taking over-the-counter pain medicines. Following a clear liquid diet. Taking antibiotic medicines. Resting. Very bad cases may need to be treated at a hospital. This may include: Not eating or drinking. Taking prescription pain medicine. Getting antibiotic medicines through an IV tube. Getting fluid and food through an IV tube. Having surgery. When you are feeling better, your doctor may tell you to have a test to check your colon (colonoscopy). Follow these instructions at home: Medicines Take over-the-counter and prescription medicines only as told by your doctor. These include: Antibiotics. Pain medicines. Fiber pills. Probiotics. Stool softeners. If you were  prescribed an antibiotic medicine, take it as told by your doctor. Do not stop taking the antibiotic even if you start to feel better. Ask your doctor if the medicine prescribed to you requires you to avoid driving or using machinery. Eating and drinking  Follow a diet as told by your doctor. When you feel better, your doctor may tell you to change your diet. You may need to eat a lot of fiber. Fiber makes it easier to poop (have a bowel movement). Foods with fiber include: Berries. Beans. Lentils. Green vegetables. Avoid eating red meat. General instructions Do not use any products that contain nicotine or tobacco, such as cigarettes, e-cigarettes, and chewing tobacco. If you need help quitting, ask your doctor. Exercise 3 or more times a week. Try to get 30 minutes each time. Exercise enough to sweat and make your heart beat faster. Keep all follow-up visits as told by your doctor. This is important. Contact a doctor if: Your pain does not get better. You are not pooping like normal. Get help right away if: Your pain gets worse. Your symptoms do not get better. Your symptoms get worse very fast. You have a fever. You vomit more than one time. You have poop that is: Bloody. Black. Tarry. Summary This condition happens when small pouches in your colon get infected or swollen. Take medicines only as told by your doctor. Follow a diet as told by your doctor. Keep all follow-up visits as told by your doctor. This is important. This information is not intended to replace advice given to you by your health care provider. Make sure you  discuss any questions you have with your health care provider. Document Revised: 10/21/2018 Document Reviewed: 10/21/2018 Elsevier Patient Education  2022 [employer].

## 2021-02-25 NOTE — Progress Notes (Signed)
° °  Subjective:    Patient ID: Gabriela Wilson, female    DOB: December 03, 1945, 76 y.o.   MRN: [id number]   Chief Complaint: Abdominal Pain   Abdominal Pain This is a new problem. The current episode started in the past 7 days. The onset quality is sudden. The problem occurs constantly. The problem has been unchanged. The pain is located in the LLQ. The pain is at a severity of 7/10. The pain is moderate. The quality of the pain is sharp, aching and cramping. The abdominal pain does not radiate. Pertinent negatives include no constipation, diarrhea, fever, nausea, vomiting or weight loss. Nothing aggravates the pain. The pain is relieved by Nothing. hx of diverticulitis      Review of Systems  Constitutional:  Negative for fever and weight loss.  Gastrointestinal:  Positive for abdominal pain. Negative for constipation, diarrhea, nausea and vomiting.      Objective:   Physical Exam Constitutional:      Appearance: She is well-developed.  Cardiovascular:     Rate and Rhythm: Normal rate and regular rhythm.  Pulmonary:     Breath sounds: Normal breath sounds.  Abdominal:     General: Abdomen is flat. Bowel sounds are normal.     Palpations: Abdomen is soft.     Tenderness: There is abdominal tenderness (LLQ on palpation).  Neurological:     Mental Status: She is alert.    BP (!) 150/84    Pulse 80    Temp 98.1 F (36.7 C) (Temporal)    Resp 20    Ht 5\' 4"  (1.626 m)    Wt 173 lb (78.5 kg)    SpO2 96%    BMI 29.70 kg/m         Assessment & Plan:  CALANDRIA MULLINGS in today with chief complaint of Abdominal Pain   1. Diverticulitis of large intestine without perforation or abscess without bleeding Continue to watch diet Force fluids Metamucil daily Meds ordered this encounter  Medications   ciprofloxacin (CIPRO) 500 MG tablet    Sig: Take 1 tablet (500 mg total) by mouth 2 (two) times daily.    Dispense:  14 tablet    Refill:  0    Order Specific Question:   Supervising  Provider    Answer:   Caryl Pina A [[id number]]   metroNIDAZOLE (FLAGYL) 500 MG tablet    Sig: Take 1 tablet (500 mg total) by mouth 2 (two) times daily.    Dispense:  14 tablet    Refill:  0    Order Specific Question:   Supervising Provider    Answer:   Caryl Pina A [[id number]]       The above assessment and management plan was discussed with the patient. The patient verbalized understanding of and has agreed to the management plan. Patient is aware to call the clinic if symptoms persist or worsen. Patient is aware when to return to the clinic for a follow-up visit. Patient educated on when it is appropriate to go to the emergency department.   Mary-Margaret Hassell Done, FNP

## 2021-07-01 ENCOUNTER — Other Ambulatory Visit: Payer: Self-pay | Admitting: Nurse Practitioner

## 2021-07-01 ENCOUNTER — Ambulatory Visit (INDEPENDENT_AMBULATORY_CARE_PROVIDER_SITE_OTHER): Payer: PPO

## 2021-07-01 VITALS — Wt 173.0 lb

## 2021-07-01 DIAGNOSIS — Z0000 Encounter for general adult medical examination without abnormal findings: Secondary | ICD-10-CM | POA: Diagnosis not present

## 2021-07-01 DIAGNOSIS — I10 Essential (primary) hypertension: Secondary | ICD-10-CM

## 2021-07-01 NOTE — Progress Notes (Signed)
Subjective:   STEPHANYE FINNICUM is a 76 y.o. female who presents for Medicare Annual (Subsequent) preventive examination.  Virtual Visit via Telephone Note  I connected with  QUINTERIA CHISUM on 07/01/21 at 12:00 PM EDT by telephone and verified that I am speaking with the correct person using two identifiers.  Location: Patient: Home Provider: [facility] Persons participating in the virtual visit: patient/Nurse Health Advisor   I discussed the limitations, risks, security and privacy concerns of performing an evaluation and management service by telephone and the availability of in person appointments. The patient expressed understanding and agreed to proceed.  Interactive audio and video telecommunications were attempted between this nurse and patient, however failed, due to patient having technical difficulties OR patient did not have access to video capability.  We continued and completed visit with audio only.  Some vital signs may be absent or patient reported.   Amiracle Neises E Deborrah Mabin, LPN   Review of Systems     Cardiac Risk Factors include: advanced age (>61mn, >>10women);dyslipidemia;hypertension;Other (see comment), Risk factor comments: COPD     Objective:    Today's Vitals   07/01/21 1203  Weight: 173 lb (78.5 kg)  PainSc: 5    Body mass index is 29.7 kg/m.     07/01/2021   12:09 PM 06/30/2020   11:17 AM 05/02/2017    9:18 AM 07/31/2014    8:41 AM 06/10/2014   11:11 AM  Advanced Directives  Does Patient Have a Medical Advance Directive? Yes No No No No  Type of [place]      [place] of [place]? No - copy requested      Would patient like information on creating a medical advance directive?  No - Patient declined Yes (ED - Information included in AVS) Yes - Educational materials given Yes - EScientist, clinical (histocompatibility and immunogenetics)given;No - patient declined information    Current Medications (verified) Outpatient  Encounter Medications as of 07/01/2021  Medication Sig   albuterol (PROAIR HFA) 108 (90 Base) MCG/ACT inhaler Inhale 2 puffs into the lungs every 6 (six) hours as needed.   ALPRAZolam (XANAX) 0.5 MG tablet Take 1 tablet (0.5 mg total) by mouth daily.   amLODipine (NORVASC) 5 MG tablet Take 1 tablet by mouth once daily   fluticasone-salmeterol (ADVAIR DISKUS) 100-50 MCG/ACT AEPB INHALE 1 DOSE BY MOUTH TWICE DAILY   Ipratropium-Albuterol (COMBIVENT) 20-100 MCG/ACT AERS respimat Inhale 1 puff into the lungs every 6 (six) hours.   lisinopril-hydrochlorothiazide (ZESTORETIC) 20-12.5 MG tablet Take 2 tablets by mouth daily.   Multiple Vitamin (MULTIVITAMIN WITH MINERALS) TABS tablet Take 1 tablet by mouth daily.   omeprazole (PRILOSEC) 20 MG capsule Take 1 capsule (20 mg total) by mouth daily.   [DISCONTINUED] ciprofloxacin (CIPRO) 500 MG tablet Take 1 tablet (500 mg total) by mouth 2 (two) times daily.   [DISCONTINUED] metroNIDAZOLE (FLAGYL) 500 MG tablet Take 1 tablet (500 mg total) by mouth 2 (two) times daily.   No facility-administered encounter medications on file as of 07/01/2021.    Allergies (verified) Patient has no known allergies.   History: Past Medical History:  Diagnosis Date   Asthma    COPD [place]ch[place] obstructive [place] disease) [place]    Hyperlipidemia    Hypertension    Osteopenia    Past Surgical History:  Procedure Laterality Date   TUBAL LIGATION     Family History  Problem Relation Age of Onset   Asthma Mother  Hypertension Mother    Dementia Mother    Diabetes Father    COPD Father    Diabetes Sister    Hypertension Sister    Diabetes Brother    Early death Brother        MVA with quadrapelgic   Diabetes Brother    Dementia Brother    Diabetes Brother    Cancer Sister        vulva   Colon cancer Neg Hx    Social History   Socioeconomic History   Marital status: Married    Spouse name: Not on file   Number of children: 3   Years of education:  Not on file   Highest education level: 11th grade  Occupational History   Occupation: retired    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  Tobacco Use   Smoking status: Former    Types: Cigarettes    Quit date: 01/24/1992    Years since quitting: 29.4   Smokeless tobacco: Never  Vaping Use   Vaping Use: Never used  Substance and Sexual Activity   Alcohol use: Yes    Comment: wine about every 2 weeks   Drug use: No   Sexual activity: Yes  Other Topics Concern   Not on file  Social History Narrative   Very involved in church   Children live in [place], [place], and [place]   Social Determinants of Health   Financial Resource Strain: Low Risk  (07/01/2021)   Overall Financial Resource Strain (CARDIA)    Difficulty of Paying Living Expenses: Not hard at all  Food Insecurity: No Food Insecurity (07/01/2021)   Hunger Vital Sign    Worried About Running Out of Food in the Last Year: Never true    Ran Out of Food in the Last Year: Never true  Transportation Needs: No Transportation Needs (07/01/2021)   PRAPARE - Hydrologist (Medical): No    Lack of Transportation (Non-Medical): No  Physical Activity: Sufficiently Active (07/01/2021)   Exercise Vital Sign    Days of Exercise per Week: 7 days    Minutes of Exercise per Session: 30 min  Stress: No Stress Concern Present (07/01/2021)   [place]    Feeling of Stress : Not at all  Social Connections: [place] (07/01/2021)   Social Connection and Isolation Panel [NHANES]    Frequency of Communication with Friends and Family: More than three times a week    Frequency of Social Gatherings with Friends and Family: More than three times a week    Attends Religious Services: More than 4 times per year    Active Member of [employer] or Organizations: Yes    Attends Music therapist: More than 4 times per year    Marital  Status: Married    Tobacco Counseling Counseling given: Not Answered   Clinical Intake:  Pre-visit preparation completed: Yes  Pain : 0-10 Pain Score: 5  Pain Type: Chronic pain Pain Location: Shoulder Pain Orientation: Left Pain Descriptors / Indicators: Aching, Discomfort Pain Onset: 1 to 4 weeks ago Pain Frequency: Intermittent     BMI - recorded: 29.7 Nutritional Status: BMI 25 -29 Overweight Nutritional Risks: None Diabetes: No  How often do you need to have someone help you when you read instructions, pamphlets, or other written materials from your doctor or pharmacy?: 1 - Never  Diabetic? no  Interpreter Needed?: No  Information entered by :: Sohana Austell, LPN   Activities of Daily Living    07/01/2021   12:09 PM  In your present state of health, do you have any difficulty performing the following activities:  Hearing? 0  Vision? 0  Difficulty concentrating or making decisions? 0  Walking or climbing stairs? 0  Dressing or bathing? 0  Doing errands, shopping? 0  Preparing Food and eating ? N  Using the Toilet? N  In the past six months, have you accidently leaked urine? N  Do you have problems with loss of bowel control? N  Managing your Medications? N  Managing your Finances? N  Housekeeping or managing your Housekeeping? N    Patient Care Team: Chevis Pretty, FNP as PCP - General (Nurse Practitioner) Lavonna Monarch, MD as Consulting Physician (Dermatology)  Indicate any recent Medical Services you may have received from other than [facility] providers in the past year (date may be approximate).     Assessment:   This is a routine wellness examination for [place].  Hearing/Vision screen Hearing Screening - Comments:: Denies hearing difficulties   Vision Screening - Comments:: Wears rx glasses - up to date with routine eye exams with [facility]  Dietary issues and exercise activities discussed: Current Exercise Habits: Home exercise  routine, Type of exercise: walking;Other - see comments (water aerobics), Time (Minutes): 30, Frequency (Times/Week): 7, Weekly Exercise (Minutes/Week): 210, Intensity: Mild, Exercise limited by: respiratory conditions(s);orthopedic condition(s)   Goals Addressed             This Visit's Progress    Exercise 150 minutes per week (moderate activity)   On track    Patient Stated   On track    07/01/2021  AWV Goal: Fall Prevention  Over the next year, patient will decrease their risk for falls by: Using assistive devices, such as a cane or walker, as needed Identifying fall risks within their home and correcting them by: Removing throw rugs Adding handrails to stairs or ramps Removing clutter and keeping a clear pathway throughout the home Increasing light, especially at night Adding shower handles/bars Raising toilet seat Identifying potential personal risk factors for falls: Medication side effects Incontinence/urgency Vestibular dysfunction Hearing loss Musculoskeletal disorders Neurological disorders Orthostatic hypotension         Depression Screen    07/01/2021   12:07 PM 02/25/2021   11:24 AM 01/11/2021   10:00 AM 07/12/2020   10:41 AM 06/30/2020   11:20 AM 06/25/2020    9:04 AM 06/16/2020   12:07 PM  PHQ 2/9 Scores  PHQ - 2 Score 0 0 0 0 0 0 0  PHQ- 9 Score  1 0 0       Fall Risk    07/01/2021   12:04 PM 02/25/2021   11:24 AM 01/11/2021   10:00 AM 07/12/2020   10:41 AM 06/30/2020   11:19 AM  Fall Risk   Falls in the past year? 0 0 0 0 0  Number falls in past yr: 0      Injury with Fall? 0      Risk for fall due to : Orthopedic patient      Follow up Falls prevention discussed    Falls evaluation completed    [place]:  Any stairs in or around the home? No  If so, are there any without handrails? No  Home free of loose throw rugs in walkways, pet beds, electrical cords, etc? Yes  Adequate lighting  in your home to reduce risk  of falls? Yes   ASSISTIVE DEVICES UTILIZED TO PREVENT FALLS:  Life alert? No  Use of a cane, walker or w/c? No  Grab bars in the bathroom? Yes  Shower chair or bench in shower? No  Elevated toilet seat or a handicapped toilet? Yes   TIMED UP AND GO:  Was the test performed? No . Telephonic visit  Cognitive Function:    05/02/2017    9:20 AM 06/10/2014   11:14 AM  MMSE - Mini Mental State Exam  Orientation to time 5 5  Orientation to Place 5 5  Registration 3 3  Attention/ Calculation 5 4  Recall 3 3  Language- name 2 objects 2 2  Language- repeat 1 1  Language- follow 3 step command 3 3  Language- read & follow direction 1 1  Write a sentence 1 1  Copy design 1 1  Total score 30 29        07/01/2021   12:10 PM 06/30/2020   11:18 AM  6CIT Screen  What Year? 0 points 0 points  What month? 0 points 0 points  What time? 0 points 0 points  Count back from 20 0 points 0 points  Months in reverse 2 points 0 points  Repeat phrase 0 points 0 points  Total Score 2 points 0 points    Immunizations Immunization History  Administered Date(s) Administered   Fluad Quad(high Dose 65+) 11/22/2018, 10/13/2019, 11/02/2020   Influenza, High Dose Seasonal PF 10/20/2015, 11/14/2016, 12/17/2017   Influenza,inj,Quad PF,6+ Mos 12/13/2012, 11/18/2013, 11/24/2014   Moderna Sars-Covid-2 Vaccination 03/20/2019, 04/18/2019    TDAP status: Due, Education has been provided regarding the importance of this vaccine. Advised may receive this vaccine at local pharmacy or Health Dept. Aware to provide a copy of the vaccination record if obtained from local pharmacy or Health Dept. Verbalized acceptance and understanding.  Flu Vaccine status: Up to date  Pneumococcal vaccine status: Declined,  Education has been provided regarding the importance of this vaccine but patient still declined. Advised may receive this vaccine at local pharmacy or Health Dept. Aware to provide a copy of the vaccination  record if obtained from local pharmacy or Health Dept. Verbalized acceptance and understanding.   Covid-19 vaccine status: Completed vaccines  Qualifies for Shingles Vaccine? Yes   Zostavax completed No   Shingrix Completed?: No.    Education has been provided regarding the importance of this vaccine. Patient has been advised to call insurance company to determine out of pocket expense if they have not yet received this vaccine. Advised may also receive vaccine at local pharmacy or Health Dept. Verbalized acceptance and understanding.  Screening Tests Health Maintenance  Topic Date Due   Zoster Vaccines- Shingrix (1 of 2) Never done   COVID-19 Vaccine (3 - Moderna risk series) 05/16/2019   TETANUS/TDAP  07/12/2021 (Originally 01/23/2013)   Pneumonia Vaccine 54+ Years old (1 - PCV) 01/11/2022 (Originally 05/31/1951)   COLONOSCOPY (Pts 45-25yr Insurance coverage will need to be confirmed)  01/11/2022 (Originally 10/27/2020)   INFLUENZA VACCINE  08/23/2021   MAMMOGRAM  11/22/2021   Hepatitis C Screening  Completed   HPV VACCINES  Aged Out   DEXA SCAN  Discontinued    Health Maintenance  Health Maintenance Due  Topic Date Due   Zoster Vaccines- Shingrix (1 of 2) Never done   COVID-19 Vaccine (3 - Moderna risk series) 05/16/2019    Colorectal cancer screening: Referral to GI placed 07/01/2021.  Pt aware the office will call re: appt.  Mammogram status: Completed 11/22/2020. Repeat every year  Declines DEXA - last done 2016 - no results in chart  Lung Cancer Screening: (Low Dose CT Chest recommended if Age 43-80 years, 30 pack-year currently smoking OR have quit w/in 15years.) does not qualify.    Additional Screening:  Hepatitis C Screening: does qualify; Completed 02/05/2015  Vision Screening: Recommended annual ophthalmology exams for early detection of glaucoma and other disorders of the eye. Is the patient up to date with their annual eye exam?  Yes  Who is the provider or what  is the name of the office in which the patient attends annual eye exams? [facility] If pt is not established with a provider, would they like to be referred to a provider to establish care? No .   Dental Screening: Recommended annual dental exams for proper oral hygiene  Community Resource Referral / Chronic Care Management: CRR required this visit?  No   CCM required this visit?  No      Plan:     I have personally reviewed and noted the following in the patient's chart:   Medical and social history Use of alcohol, tobacco or illicit drugs  Current medications and supplements including opioid prescriptions.  Functional ability and status Nutritional status Physical activity Advanced directives List of other physicians Hospitalizations, surgeries, and ER visits in previous 12 months Vitals Screenings to include cognitive, depression, and falls Referrals and appointments  In addition, I have reviewed and discussed with patient certain preventive protocols, quality metrics, and best practice recommendations. A written personalized care plan for preventive services as well as general preventive health recommendations were provided to patient.     Sandrea Hammond, LPN   07/28/2829   Nurse Notes: None

## 2021-07-01 NOTE — Patient Instructions (Signed)
[name] , Thank you for taking time to come for your Medicare Wellness Visit. I appreciate your ongoing commitment to your health goals. Please review the following plan we discussed and let me know if I can assist you in the future.   Screening recommendations/referrals: Colonoscopy: Done 10/28/2010 - Repeat in 10 years *sent referral today to [facility] Mammogram: Done 11/22/2020 - Repeat annually Bone Density: Done 06/10/2014 - recommended every 2 years - patient declines Recommended yearly ophthalmology/optometry visit for glaucoma screening and checkup Recommended yearly dental visit for hygiene and checkup  Vaccinations: Influenza vaccine: Done 11/02/2020 - Repeat annually  Pneumococcal vaccine: Recommend once per lifetime Prevnar-20 Tdap vaccine: recommended every 10 years Shingles vaccine: recommend Shingrix which is 2 doses 2-6 months apart and over 90% effective     Covid-19: Done 03/20/2019 & 04/18/2019  Advanced directives: Please bring a copy of your health care power of attorney and living will to the office to be added to your chart at your convenience.   Conditions/risks identified: Aim for 30 minutes of exercise or brisk walking, 6-8 glasses of water, and 5 servings of fruits and vegetables each day.   Next appointment: Follow up in one year for your annual wellness visit    Preventive Care 65 Years and Older, Female Preventive care refers to lifestyle choices and visits with your health care provider that can promote health and wellness. What does preventive care include? A yearly physical exam. This is also called an annual well check. Dental exams once or twice a year. Routine eye exams. Ask your health care provider how often you should have your eyes checked. Personal lifestyle choices, including: Daily care of your teeth and gums. Regular physical activity. Eating a healthy diet. Avoiding tobacco and drug use. Limiting alcohol use. Practicing safe  sex. Taking low-dose aspirin every day. Taking vitamin and mineral supplements as recommended by your health care provider. What happens during an annual well check? The services and screenings done by your health care provider during your annual well check will depend on your age, overall health, lifestyle risk factors, and family history of disease. Counseling  Your health care provider may ask you questions about your: Alcohol use. Tobacco use. Drug use. Emotional well-being. Home and relationship well-being. Sexual activity. Eating habits. History of falls. Memory and ability to understand (cognition). Work and work Statistician. Reproductive health. Screening  You may have the following tests or measurements: Height, weight, and BMI. Blood pressure. Lipid and cholesterol levels. These may be checked every 5 years, or more frequently if you are over 54 years old. Skin check. Lung cancer screening. You may have this screening every year starting at age 57 if you have a 30-pack-year history of smoking and currently smoke or have quit within the past 15 years. Fecal occult blood test (FOBT) of the stool. You may have this test every year starting at age 71. Flexible sigmoidoscopy or colonoscopy. You may have a sigmoidoscopy every 5 years or a colonoscopy every 10 years starting at age 45. Hepatitis C blood test. Hepatitis B blood test. Sexually transmitted disease (STD) testing. Diabetes screening. This is done by checking your blood sugar (glucose) after you have not eaten for a while (fasting). You may have this done every 1-3 years. Bone density scan. This is done to screen for osteoporosis. You may have this done starting at age 60. Mammogram. This may be done every 1-2 years. Talk to your health care provider about how often you should  have regular mammograms. Talk with your health care provider about your test results, treatment options, and if necessary, the need for more  tests. Vaccines  Your health care provider may recommend certain vaccines, such as: Influenza vaccine. This is recommended every year. Tetanus, diphtheria, and acellular pertussis (Tdap, Td) vaccine. You may need a Td booster every 10 years. Zoster vaccine. You may need this after age 38. Pneumococcal 13-valent conjugate (PCV13) vaccine. One dose is recommended after age 75. Pneumococcal polysaccharide (PPSV23) vaccine. One dose is recommended after age 68. Talk to your health care provider about which screenings and vaccines you need and how often you need them. This information is not intended to replace advice given to you by your health care provider. Make sure you discuss any questions you have with your health care provider. Document Released: 02/05/2015 Document Revised: 09/29/2015 Document Reviewed: 11/10/2014 Elsevier Interactive Patient Education  2017 [place] Prevention in the Home Falls can cause injuries. They can happen to people of all ages. There are many things you can do to make your home safe and to help prevent falls. What can I do on the outside of my home? Regularly fix the edges of walkways and driveways and fix any cracks. Remove anything that might make you trip as you walk through a door, such as a raised step or threshold. Trim any bushes or trees on the path to your home. Use bright outdoor lighting. Clear any walking paths of anything that might make someone trip, such as rocks or tools. Regularly check to see if handrails are loose or broken. Make sure that both sides of any steps have handrails. Any raised decks and porches should have guardrails on the edges. Have any leaves, snow, or ice cleared regularly. Use sand or salt on walking paths during winter. Clean up any spills in your garage right away. This includes oil or grease spills. What can I do in the bathroom? Use night lights. Install grab bars by the toilet and in the tub and shower.  Do not use towel bars as grab bars. Use non-skid mats or decals in the tub or shower. If you need to sit down in the shower, use a plastic, non-slip stool. Keep the floor dry. Clean up any water that spills on the floor as soon as it happens. Remove soap buildup in the tub or shower regularly. Attach bath mats securely with double-sided non-slip rug tape. Do not have throw rugs and other things on the floor that can make you trip. What can I do in the bedroom? Use night lights. Make sure that you have a light by your bed that is easy to reach. Do not use any sheets or blankets that are too big for your bed. They should not hang down onto the floor. Have a firm chair that has side arms. You can use this for support while you get dressed. Do not have throw rugs and other things on the floor that can make you trip. What can I do in the kitchen? Clean up any spills right away. Avoid walking on wet floors. Keep items that you use a lot in easy-to-reach places. If you need to reach something above you, use a strong step stool that has a grab bar. Keep electrical cords out of the way. Do not use floor polish or wax that makes floors slippery. If you must use wax, use non-skid floor wax. Do not have throw rugs and other things on the floor  that can make you trip. What can I do with my stairs? Do not leave any items on the stairs. Make sure that there are handrails on both sides of the stairs and use them. Fix handrails that are broken or loose. Make sure that handrails are as long as the stairways. Check any carpeting to make sure that it is firmly attached to the stairs. Fix any carpet that is loose or worn. Avoid having throw rugs at the top or bottom of the stairs. If you do have throw rugs, attach them to the floor with carpet tape. Make sure that you have a light switch at the top of the stairs and the bottom of the stairs. If you do not have them, ask someone to add them for you. What else  can I do to help prevent falls? Wear shoes that: Do not have high heels. Have rubber bottoms. Are comfortable and fit you well. Are closed at the toe. Do not wear sandals. If you use a stepladder: Make sure that it is fully opened. Do not climb a closed stepladder. Make sure that both sides of the stepladder are locked into place. Ask someone to hold it for you, if possible. Clearly mark and make sure that you can see: Any grab bars or handrails. First and last steps. Where the edge of each step is. Use tools that help you move around (mobility aids) if they are needed. These include: Canes. Walkers. Scooters. Crutches. Turn on the lights when you go into a dark area. Replace any light bulbs as soon as they burn out. Set up your furniture so you have a clear path. Avoid moving your furniture around. If any of your floors are uneven, fix them. If there are any pets around you, be aware of where they are. Review your medicines with your doctor. Some medicines can make you feel dizzy. This can increase your chance of falling. Ask your doctor what other things that you can do to help prevent falls. This information is not intended to replace advice given to you by your health care provider. Make sure you discuss any questions you have with your health care provider. Document Released: 11/05/2008 Document Revised: 06/17/2015 Document Reviewed: 02/13/2014 Elsevier Interactive Patient Education  2017 [employer].

## 2021-07-07 ENCOUNTER — Ambulatory Visit (INDEPENDENT_AMBULATORY_CARE_PROVIDER_SITE_OTHER): Payer: PPO | Admitting: Nurse Practitioner

## 2021-07-07 ENCOUNTER — Encounter: Payer: Self-pay | Admitting: Nurse Practitioner

## 2021-07-07 VITALS — BP 124/70 | HR 80 | Temp 98.1°F | Resp 20 | Ht 64.0 in | Wt 173.0 lb

## 2021-07-07 DIAGNOSIS — K219 Gastro-esophageal reflux disease without esophagitis: Secondary | ICD-10-CM

## 2021-07-07 DIAGNOSIS — E785 Hyperlipidemia, unspecified: Secondary | ICD-10-CM

## 2021-07-07 DIAGNOSIS — J439 Emphysema, unspecified: Secondary | ICD-10-CM

## 2021-07-07 DIAGNOSIS — K5790 Diverticulosis of intestine, part unspecified, without perforation or abscess without bleeding: Secondary | ICD-10-CM

## 2021-07-07 DIAGNOSIS — M25512 Pain in left shoulder: Secondary | ICD-10-CM

## 2021-07-07 DIAGNOSIS — Z6829 Body mass index (BMI) 29.0-29.9, adult: Secondary | ICD-10-CM

## 2021-07-07 DIAGNOSIS — I10 Essential (primary) hypertension: Secondary | ICD-10-CM

## 2021-07-07 DIAGNOSIS — F410 Panic disorder [episodic paroxysmal anxiety] without agoraphobia: Secondary | ICD-10-CM

## 2021-07-07 DIAGNOSIS — M8588 Other specified disorders of bone density and structure, other site: Secondary | ICD-10-CM

## 2021-07-07 DIAGNOSIS — M5489 Other dorsalgia: Secondary | ICD-10-CM

## 2021-07-07 DIAGNOSIS — F411 Generalized anxiety disorder: Secondary | ICD-10-CM

## 2021-07-07 MED ORDER — LISINOPRIL-HYDROCHLOROTHIAZIDE 20-12.5 MG PO TABS: 2.0000 | ORAL_TABLET | Freq: Every day | ORAL | 1 refills | Status: DC

## 2021-07-07 MED ORDER — AMLODIPINE BESYLATE 5 MG PO TABS: 5.0000 mg | ORAL_TABLET | Freq: Every day | ORAL | 1 refills | Status: DC

## 2021-07-07 MED ORDER — OMEPRAZOLE 20 MG PO CPDR: 20.0000 mg | DELAYED_RELEASE_CAPSULE | Freq: Every day | ORAL | 1 refills | Status: DC

## 2021-07-07 MED ORDER — DICLOFENAC SODIUM 1 % EX GEL: 4.0000 g | Freq: Four times a day (QID) | CUTANEOUS | 1 refills | Status: DC

## 2021-07-07 MED ORDER — ALPRAZOLAM 0.5 MG PO TABS: 0.5000 mg | ORAL_TABLET | Freq: Every day | ORAL | 5 refills | Status: DC

## 2021-07-07 MED ORDER — FLUTICASONE-SALMETEROL 100-50 MCG/ACT IN AEPB: INHALATION_SPRAY | RESPIRATORY_TRACT | 1 refills | Status: DC

## 2021-07-07 NOTE — Progress Notes (Signed)
Subjective:    Patient ID: Gabriela Wilson, female    DOB: [birth year], 76 y.o.   MRN: [id number]   Chief Complaint: medical management of chronic issues     HPI:  Gabriela Wilson is a 76 y.o. who identifies as a female who was assigned female at birth.   Social history: Lives with: husband- he was recently dx with brain tumor Work history: retired from Scientist, physiological in today for follow up of the following chronic medical issues:  1. Essential hypertension, benign No c/o chest pain, sob or headache. Does not check blood pressure at home. BP Readings from Last 3 Encounters:  07/07/21 124/70  02/25/21 (!) 150/84  01/11/21 (!) 139/52      2. Hyperlipidemia with target LDL less than 100 Does not watch diet and does little to no exercise. Lab Results  Component Value Date   CHOL 190 01/11/2021   HDL 52 01/11/2021   LDLCALC 111 (H) 01/11/2021   TRIG 151 (H) 01/11/2021   CHOLHDL 3.7 01/11/2021     3. Pulmonary emphysema, unspecified emphysema type ([place]) Says she is doing well. Is on advair and is doing well. No c/o cough.  4. Diverticulosis Denies any recent flare ups  5. Gastroesophageal reflux disease, unspecified whether esophagitis present Is on omperpazole dialy and is doing well.  6. GAD (generalized anxiety disorder) Is on xanax 1 daily.    07/07/2021    8:34 AM 02/25/2021   11:25 AM 01/11/2021   10:00 AM 07/12/2020   10:41 AM  GAD 7 : Generalized Anxiety Score  Nervous, Anxious, on Edge 0 0 0 0  Control/stop worrying 0 0 0 0  Worry too much - different things 0 0 0 0  Trouble relaxing 0 0 0 0  Restless 0 0 0 0  Easily annoyed or irritable 0 0 0 0  Afraid - awful might happen 0 0 0 0  Total GAD 7 Score 0 0 0 0  Anxiety Difficulty Not difficult at all Not difficult at all Not difficult at all Not difficult at all      7. Back pain without sciatica Rates pain 1/10 currently. Has not been bother ing her.  8. PANIC DISORDER Has been  doing  well. Takes her xanax if she gets anxious.  9. Osteopenia of lumbar spine Last dexascan was done in 2016. Does not want to repeat.  10. BMI 29.0-29.9,adult No weight changes Wt Readings from Last 3 Encounters:  07/07/21 173 lb (78.5 kg)  07/01/21 173 lb (78.5 kg)  02/25/21 173 lb (78.5 kg)   BMI Readings from Last 3 Encounters:  07/07/21 29.70 kg/m  07/01/21 29.70 kg/m  02/25/21 29.70 kg/m     New complaints: Left shoulder pain. Takes tylenol which helps.  No Known Allergies Outpatient Encounter Medications as of 07/07/2021  Medication Sig   albuterol (PROAIR HFA) 108 (90 Base) MCG/ACT inhaler Inhale 2 puffs into the lungs every 6 (six) hours as needed.   ALPRAZolam (XANAX) 0.5 MG tablet Take 1 tablet (0.5 mg total) by mouth daily.   amLODipine (NORVASC) 5 MG tablet Take 1 tablet by mouth once daily   fluticasone-salmeterol (ADVAIR DISKUS) 100-50 MCG/ACT AEPB INHALE 1 DOSE BY MOUTH TWICE DAILY   Ipratropium-Albuterol (COMBIVENT) 20-100 MCG/ACT AERS respimat Inhale 1 puff into the lungs every 6 (six) hours.   lisinopril-hydrochlorothiazide (ZESTORETIC) 20-12.5 MG tablet Take 2 tablets by mouth daily.   Multiple Vitamin (MULTIVITAMIN WITH MINERALS) TABS tablet Take  1 tablet by mouth daily.   omeprazole (PRILOSEC) 20 MG capsule Take 1 capsule (20 mg total) by mouth daily.   No facility-administered encounter medications on file as of 07/07/2021.    Past Surgical History:  Procedure Laterality Date   TUBAL LIGATION      Family History  Problem Relation Age of Onset   Asthma Mother    Hypertension Mother    Dementia Mother    Diabetes Father    COPD Father    Diabetes Sister    Hypertension Sister    Diabetes Brother    Early death Brother        MVA with quadrapelgic   Diabetes Brother    Dementia Brother    Diabetes Brother    Cancer Sister        vulva   Colon cancer Neg Hx       Controlled substance contract: 01/20/22     Review of Systems   Constitutional:  Negative for diaphoresis.  Eyes:  Negative for pain.  Respiratory:  Negative for shortness of breath.   Cardiovascular:  Negative for chest pain, palpitations and leg swelling.  Gastrointestinal:  Negative for abdominal pain.  Endocrine: Negative for polydipsia.  Musculoskeletal:  Positive for arthralgias (left shoulder).  Skin:  Negative for rash.  Neurological:  Negative for dizziness, weakness and headaches.  Hematological:  Does not bruise/bleed easily.  All other systems reviewed and are negative.      Objective:   Physical Exam Vitals and nursing note reviewed.  Constitutional:      General: She is not in acute distress.    Appearance: Normal appearance. She is well-developed.  HENT:     Head: Normocephalic.     Right Ear: Tympanic membrane normal.     Left Ear: Tympanic membrane normal.     Nose: Nose normal.     Mouth/Throat:     Mouth: Mucous membranes are moist.  Eyes:     Pupils: Pupils are equal, round, and reactive to light.  Neck:     Vascular: No carotid bruit or JVD.  Cardiovascular:     Rate and Rhythm: Normal rate and regular rhythm.     Heart sounds: Normal heart sounds.  Pulmonary:     Effort: Pulmonary effort is normal. No respiratory distress.     Breath sounds: Normal breath sounds. No wheezing or rales.  Chest:     Chest wall: No tenderness.  Abdominal:     General: Bowel sounds are normal. There is no distension or abdominal bruit.     Palpations: Abdomen is soft. There is no hepatomegaly, splenomegaly, mass or pulsatile mass.     Tenderness: There is no abdominal tenderness.  Musculoskeletal:        General: Normal range of motion.     Cervical back: Normal range of motion and neck supple.     Right lower leg: Edema (1+) present.     Left lower leg: Edema (1+) present.     Comments: FROM of left shoulder with pa on abduction and internal rotation  Lymphadenopathy:     Cervical: No cervical adenopathy.  Skin:    General:  Skin is warm and dry.  Neurological:     Mental Status: She is alert and oriented to person, place, and time.     Deep Tendon Reflexes: Reflexes are normal and symmetric.  Psychiatric:        Behavior: Behavior normal.        Thought Content:  Thought content normal.        Judgment: Judgment normal.     BP 124/70   Pulse 80   Temp 98.1 F (36.7 C) (Temporal)   Resp 20   Ht _0  (1.626 m)   Wt 173 lb (78.5 kg)   SpO2 97%   BMI 29.70 kg/m        Assessment & Plan:  BEOLA VASALLO comes in today with chief complaint of Medical Management of Chronic Issues   Diagnosis and orders addressed:  1. Essential hypertension, benign Low sodium diet - CBC with Differential/Platelet - CMP14+EGFR - amLODipine (NORVASC) 5 MG tablet; Take 1 tablet (5 mg total) by mouth daily.  Dispense: 90 tablet; Refill: 1 - lisinopril-hydrochlorothiazide (ZESTORETIC) 20-12.5 MG tablet; Take 2 tablets by mouth daily.  Dispense: 180 tablet; Refill: 1  2. Hyperlipidemia with target LDL less than 100 Low fat diet - Lipid panel  3. Pulmonary emphysema, unspecified emphysema type ([facility]) Continue inhaler daily - fluticasone-salmeterol (ADVAIR DISKUS) 100-50 MCG/ACT AEPB; INHALE 1 DOSE BY MOUTH TWICE DAILY  Dispense: 180 each; Refill: 1  4. Diverticulosis Watch diet to prevent flare up  5. Gastroesophageal reflux disease, unspecified whether esophagitis present Avoid spicy foods Do not eat 2 hours prior to bedtime  - omeprazole (PRILOSEC) 20 MG capsule; Take 1 capsule (20 mg total) by mouth daily.  Dispense: 90 capsule; Refill: 1  6. GAD (generalized anxiety disorder) Stress manaegment - ALPRAZolam (XANAX) 0.5 MG tablet; Take 1 tablet (0.5 mg total) by mouth daily.  Dispense: 30 tablet; Refill: 5  7. Back pain without sciatica  8. PANIC DISORDER  9. Osteopenia of lumbar spine Weight bearing exercises Refuses repeat dexascan  10. BMI 29.0-29.9,adult Discussed diet and exercise for person  with BMI >25 Will recheck weight in 3-6 months   11. Acute pain of left shoulder Moist heat rest - diclofenac Sodium (VOLTAREN) 1 % GEL; Apply 4 g topically 4 (four) times daily.  Dispense: 350 g; Refill: 1   Labs pending Health Maintenance reviewed Diet and exercise encouraged  Follow up plan: 6 months   Mary-Margaret Hassell Done, FNP

## 2021-07-07 NOTE — Patient Instructions (Signed)

## 2021-07-08 LAB — CMP14+EGFR
ALT: 23 IU/L (ref 0–32)
AST: 25 IU/L (ref 0–40)
Albumin/Globulin Ratio: 2.4 — ABNORMAL HIGH (ref 1.2–2.2)
Albumin: 4.7 g/dL (ref 3.7–4.7)
Alkaline Phosphatase: 60 IU/L (ref 44–121)
BUN/Creatinine Ratio: 13 (ref 12–28)
BUN: 13 mg/dL (ref 8–27)
Bilirubin Total: 0.8 mg/dL (ref 0.0–1.2)
CO2: 24 mmol/L (ref 20–29)
Calcium: 10.3 mg/dL (ref 8.7–10.3)
Chloride: 99 mmol/L (ref 96–106)
Creatinine, Ser: 0.99 mg/dL (ref 0.57–1.00)
Globulin, Total: 2 g/dL (ref 1.5–4.5)
Glucose: 106 mg/dL — ABNORMAL HIGH (ref 70–99)
Potassium: 4.8 mmol/L (ref 3.5–5.2)
Sodium: 137 mmol/L (ref 134–144)
Total Protein: 6.7 g/dL (ref 6.0–8.5)
eGFR: 59 mL/min/{1.73_m2} — ABNORMAL LOW (ref >59)

## 2021-07-08 LAB — CBC WITH DIFFERENTIAL/PLATELET
Basophils Absolute: 0.1 10*3/uL (ref 0.0–0.2)
Basos: 1 %
EOS (ABSOLUTE): 0.2 10*3/uL (ref 0.0–0.4)
Eos: 4 %
Hematocrit: 40.9 % (ref 34.0–46.6)
Hemoglobin: 14.1 g/dL (ref 11.1–15.9)
Immature Grans (Abs): 0 10*3/uL (ref 0.0–0.1)
Immature Granulocytes: 0 %
Lymphocytes Absolute: 1.8 10*3/uL (ref 0.7–3.1)
Lymphs: 34 %
MCH: 31.1 pg (ref 26.6–33.0)
MCHC: 34.5 g/dL (ref 31.5–35.7)
MCV: 90 fL (ref 79–97)
Monocytes Absolute: 0.4 10*3/uL (ref 0.1–0.9)
Monocytes: 8 %
Neutrophils Absolute: 2.9 10*3/uL (ref 1.4–7.0)
Neutrophils: 53 %
Platelets: 274 10*3/uL (ref 150–450)
RBC: 4.54 x10E6/uL (ref 3.77–5.28)
RDW: 12.6 % (ref 11.7–15.4)
WBC: 5.5 10*3/uL (ref 3.4–10.8)

## 2021-07-08 LAB — LIPID PANEL
Chol/HDL Ratio: 3.4 ratio (ref 0.0–4.4)
Cholesterol, Total: 169 mg/dL (ref 100–199)
HDL: 49 mg/dL (ref >39)
LDL Chol Calc (NIH): 99 mg/dL (ref 0–99)
Triglycerides: 117 mg/dL (ref 0–149)
VLDL Cholesterol Cal: 21 mg/dL (ref 5–40)

## 2021-09-28 ENCOUNTER — Ambulatory Visit (INDEPENDENT_AMBULATORY_CARE_PROVIDER_SITE_OTHER): Payer: PPO | Admitting: Family Medicine

## 2021-09-28 ENCOUNTER — Encounter: Payer: Self-pay | Admitting: Family Medicine

## 2021-09-28 VITALS — BP 112/67 | HR 90 | Temp 98.3°F | Resp 20 | Ht 64.0 in | Wt 175.0 lb

## 2021-09-28 DIAGNOSIS — J0140 Acute pansinusitis, unspecified: Secondary | ICD-10-CM

## 2021-09-28 DIAGNOSIS — R509 Fever, unspecified: Secondary | ICD-10-CM | POA: Diagnosis not present

## 2021-09-28 DIAGNOSIS — R051 Acute cough: Secondary | ICD-10-CM | POA: Diagnosis not present

## 2021-09-28 MED ORDER — AMOXICILLIN-POT CLAVULANATE 875-125 MG PO TABS: 1.0000 | ORAL_TABLET | Freq: Two times a day (BID) | ORAL | 0 refills | Status: AC

## 2021-09-28 NOTE — Patient Instructions (Signed)

## 2021-09-28 NOTE — Progress Notes (Signed)
Assessment & Plan:  1. Acute non-recurrent pansinusitis Education provided on sinusitis.  Continue symptom management. - amoxicillin-clavulanate (AUGMENTIN) 875-125 MG tablet; Take 1 tablet by mouth 2 (two) times daily for 7 days.  Dispense: 14 tablet; Refill: 0  2. Febrile illness - COVID-19, Flu A+B and RSV  3. Acute cough - COVID-19, Flu A+B and RSV   Follow up plan: Return if symptoms worsen or fail to improve.  Hendricks Limes, MSN, APRN, FNP-C [facility]  Subjective:   Patient ID: Gabriela Wilson, female    DOB: November 24, 1945, 76 y.o.   MRN: [id number]  HPI: Gabriela Wilson is a 76 y.o. female presenting on 09/28/2021 for Fever (Chills, cough, HA, achy joints )  Patient complains of cough, headache, runny nose, sore throat, ear pain/pressure, fever, postnasal drainage, and body aches . Onset of symptoms was 5 days ago, gradually improving since that time. She is drinking plenty of fluids. Evaluation to date: at home COVID test negative. Treatment to date:  Advil, Tylenol, and muscle cream . She has a history of asthma and COPD. She does not smoke.    ROS: Negative unless specifically indicated above in HPI.   Relevant past medical history reviewed and updated as indicated.   Allergies and medications reviewed and updated.   Current Outpatient Medications:    albuterol (PROAIR HFA) 108 (90 Base) MCG/ACT inhaler, Inhale 2 puffs into the lungs every 6 (six) hours as needed., Disp: 3 each, Rfl: 1   ALPRAZolam (XANAX) 0.5 MG tablet, Take 1 tablet (0.5 mg total) by mouth daily., Disp: 30 tablet, Rfl: 5   amLODipine (NORVASC) 5 MG tablet, Take 1 tablet (5 mg total) by mouth daily., Disp: 90 tablet, Rfl: 1   diclofenac Sodium (VOLTAREN) 1 % GEL, Apply 4 g topically 4 (four) times daily., Disp: 350 g, Rfl: 1   fluticasone-salmeterol (ADVAIR DISKUS) 100-50 MCG/ACT AEPB, INHALE 1 DOSE BY MOUTH TWICE DAILY, Disp: 180 each, Rfl: 1   Ipratropium-Albuterol (COMBIVENT)  20-100 MCG/ACT AERS respimat, Inhale 1 puff into the lungs every 6 (six) hours., Disp: 12 g, Rfl: 1   lisinopril-hydrochlorothiazide (ZESTORETIC) 20-12.5 MG tablet, Take 2 tablets by mouth daily., Disp: 180 tablet, Rfl: 1   Multiple Vitamin (MULTIVITAMIN WITH MINERALS) TABS tablet, Take 1 tablet by mouth daily., Disp: , Rfl:    omeprazole (PRILOSEC) 20 MG capsule, Take 1 capsule (20 mg total) by mouth daily., Disp: 90 capsule, Rfl: 1  No Known Allergies  Objective:   BP 112/67   Pulse 90   Temp 98.3 F (36.8 C)   Resp 20   Ht '5\' 4"'$  (1.626 m)   Wt 175 lb (79.4 kg)   SpO2 98%   BMI 30.04 kg/m    Physical Exam Vitals reviewed.  Constitutional:      General: She is not in acute distress.    Appearance: Normal appearance. She is not ill-appearing, toxic-appearing or diaphoretic.  HENT:     Head: Normocephalic and atraumatic.     Right Ear: Tympanic membrane, ear canal and external ear normal. There is no impacted cerumen.     Left Ear: Tympanic membrane, ear canal and external ear normal. There is no impacted cerumen.     Nose: Congestion present. No rhinorrhea.     Right Sinus: Maxillary sinus tenderness and frontal sinus tenderness present.     Left Sinus: Maxillary sinus tenderness and frontal sinus tenderness present.     Mouth/Throat:     Mouth: Mucous membranes  are moist.     Pharynx: Oropharynx is clear. No oropharyngeal exudate or posterior oropharyngeal erythema.  Eyes:     General: No scleral icterus.       Right eye: No discharge.        Left eye: No discharge.     Conjunctiva/sclera: Conjunctivae normal.  Cardiovascular:     Rate and Rhythm: Normal rate and regular rhythm.     Heart sounds: Normal heart sounds. No murmur heard.    No friction rub. No gallop.  Pulmonary:     Effort: Pulmonary effort is normal. No respiratory distress.     Breath sounds: Normal breath sounds. No stridor. No wheezing, rhonchi or rales.  Musculoskeletal:        General: Normal range  of motion.     Cervical back: Normal range of motion.  Lymphadenopathy:     Cervical: No cervical adenopathy.  Skin:    General: Skin is warm and dry.     Capillary Refill: Capillary refill takes less than 2 seconds.  Neurological:     General: No focal deficit present.     Mental Status: She is alert and oriented to person, place, and time. Mental status is at baseline.  Psychiatric:        Mood and Affect: Mood normal.        Behavior: Behavior normal.        Thought Content: Thought content normal.        Judgment: Judgment normal.

## 2021-09-29 LAB — COVID-19, FLU A+B AND RSV
Influenza A, NAA: NOT DETECTED
Influenza B, NAA: NOT DETECTED
RSV, NAA: NOT DETECTED
SARS-CoV-2, NAA: NOT DETECTED

## 2021-10-24 ENCOUNTER — Other Ambulatory Visit (HOSPITAL_COMMUNITY): Payer: Self-pay | Admitting: Nurse Practitioner

## 2021-10-24 DIAGNOSIS — Z1231 Encounter for screening mammogram for malignant neoplasm of breast: Secondary | ICD-10-CM

## 2021-12-02 ENCOUNTER — Ambulatory Visit (HOSPITAL_COMMUNITY)
Admission: RE | Admit: 2021-12-02 | Discharge: 2021-12-02 | Disposition: A | Discharge status: Home / Self Care | Payer: PPO | Source: Ambulatory Visit | Attending: Nurse Practitioner | Admitting: Nurse Practitioner

## 2021-12-02 DIAGNOSIS — Z1231 Encounter for screening mammogram for malignant neoplasm of breast: Secondary | ICD-10-CM | POA: Diagnosis present

## 2022-01-09 ENCOUNTER — Encounter: Payer: Self-pay | Admitting: Nurse Practitioner

## 2022-01-09 ENCOUNTER — Ambulatory Visit (INDEPENDENT_AMBULATORY_CARE_PROVIDER_SITE_OTHER): Payer: PPO | Admitting: Nurse Practitioner

## 2022-01-09 VITALS — BP 149/76 | HR 64 | Temp 98.2°F | Resp 20 | Ht 64.0 in | Wt 178.0 lb

## 2022-01-09 DIAGNOSIS — M5489 Other dorsalgia: Secondary | ICD-10-CM

## 2022-01-09 DIAGNOSIS — K219 Gastro-esophageal reflux disease without esophagitis: Secondary | ICD-10-CM | POA: Diagnosis not present

## 2022-01-09 DIAGNOSIS — I10 Essential (primary) hypertension: Secondary | ICD-10-CM

## 2022-01-09 DIAGNOSIS — K5790 Diverticulosis of intestine, part unspecified, without perforation or abscess without bleeding: Secondary | ICD-10-CM | POA: Diagnosis not present

## 2022-01-09 DIAGNOSIS — J439 Emphysema, unspecified: Secondary | ICD-10-CM | POA: Diagnosis not present

## 2022-01-09 DIAGNOSIS — Z23 Encounter for immunization: Secondary | ICD-10-CM | POA: Diagnosis not present

## 2022-01-09 DIAGNOSIS — Z6829 Body mass index (BMI) 29.0-29.9, adult: Secondary | ICD-10-CM

## 2022-01-09 DIAGNOSIS — E785 Hyperlipidemia, unspecified: Secondary | ICD-10-CM

## 2022-01-09 DIAGNOSIS — F410 Panic disorder [episodic paroxysmal anxiety] without agoraphobia: Secondary | ICD-10-CM

## 2022-01-09 DIAGNOSIS — F411 Generalized anxiety disorder: Secondary | ICD-10-CM

## 2022-01-09 LAB — CBC WITH DIFFERENTIAL/PLATELET
Basophils Absolute: 0.1 10*3/uL (ref 0.0–0.2)
Basos: 1 %
EOS (ABSOLUTE): 0.2 10*3/uL (ref 0.0–0.4)
Eos: 3 %
Hematocrit: 42 % (ref 34.0–46.6)
Hemoglobin: 14.5 g/dL (ref 11.1–15.9)
Immature Grans (Abs): 0 10*3/uL (ref 0.0–0.1)
Immature Granulocytes: 0 %
Lymphocytes Absolute: 2.1 10*3/uL (ref 0.7–3.1)
Lymphs: 32 %
MCH: 31.1 pg (ref 26.6–33.0)
MCHC: 34.5 g/dL (ref 31.5–35.7)
MCV: 90 fL (ref 79–97)
Monocytes Absolute: 0.4 10*3/uL (ref 0.1–0.9)
Monocytes: 6 %
Neutrophils Absolute: 3.8 10*3/uL (ref 1.4–7.0)
Neutrophils: 58 %
Platelets: 235 10*3/uL (ref 150–450)
RBC: 4.66 x10E6/uL (ref 3.77–5.28)
RDW: 12.2 % (ref 11.7–15.4)
WBC: 6.6 10*3/uL (ref 3.4–10.8)

## 2022-01-09 LAB — CMP14+EGFR
ALT: 24 IU/L (ref 0–32)
AST: 21 IU/L (ref 0–40)
Albumin/Globulin Ratio: 2.3 — ABNORMAL HIGH (ref 1.2–2.2)
Albumin: 4.8 g/dL (ref 3.8–4.8)
Alkaline Phosphatase: 61 IU/L (ref 44–121)
BUN/Creatinine Ratio: 14 (ref 12–28)
BUN: 13 mg/dL (ref 8–27)
Bilirubin Total: 0.6 mg/dL (ref 0.0–1.2)
CO2: 24 mmol/L (ref 20–29)
Calcium: 10.5 mg/dL — ABNORMAL HIGH (ref 8.7–10.3)
Chloride: 100 mmol/L (ref 96–106)
Creatinine, Ser: 0.92 mg/dL (ref 0.57–1.00)
Globulin, Total: 2.1 g/dL (ref 1.5–4.5)
Glucose: 108 mg/dL — ABNORMAL HIGH (ref 70–99)
Potassium: 4.4 mmol/L (ref 3.5–5.2)
Sodium: 140 mmol/L (ref 134–144)
Total Protein: 6.9 g/dL (ref 6.0–8.5)
eGFR: 65 mL/min/{1.73_m2} (ref >59)

## 2022-01-09 LAB — LIPID PANEL
Chol/HDL Ratio: 3.8 ratio (ref 0.0–4.4)
Cholesterol, Total: 195 mg/dL (ref 100–199)
HDL: 52 mg/dL (ref >39)
LDL Chol Calc (NIH): 107 mg/dL — ABNORMAL HIGH (ref 0–99)
Triglycerides: 209 mg/dL — ABNORMAL HIGH (ref 0–149)
VLDL Cholesterol Cal: 36 mg/dL (ref 5–40)

## 2022-01-09 MED ORDER — OMEPRAZOLE 20 MG PO CPDR: 20.0000 mg | DELAYED_RELEASE_CAPSULE | Freq: Every day | ORAL | 1 refills | Status: DC

## 2022-01-09 MED ORDER — AMLODIPINE BESYLATE 5 MG PO TABS: 5.0000 mg | ORAL_TABLET | Freq: Every day | ORAL | 1 refills | Status: DC

## 2022-01-09 MED ORDER — LISINOPRIL-HYDROCHLOROTHIAZIDE 20-12.5 MG PO TABS: 2.0000 | ORAL_TABLET | Freq: Every day | ORAL | 1 refills | Status: DC

## 2022-01-09 MED ORDER — ALPRAZOLAM 0.5 MG PO TABS: 0.5000 mg | ORAL_TABLET | Freq: Every day | ORAL | 5 refills | Status: DC

## 2022-01-09 NOTE — Patient Instructions (Signed)

## 2022-01-09 NOTE — Progress Notes (Signed)
Subjective:    Patient ID: Gabriela Wilson, female    DOB: 23-Oct-1945, 76 y.o.   MRN: [id number]   Chief Complaint: medical management of chronic issues     HPI:  Gabriela Wilson is a 76 y.o. who identifies as a female who was assigned female at birth.   Social history: Lives with: husband Work history: retired   Scientist, forensic in today for follow up of the following chronic medical issues:  1. Essential hypertension, benign No c/o chest pain, sob or headache. Does not check blood pressure at home. BP Readings from Last 3 Encounters:  09/28/21 112/67  07/07/21 124/70  02/25/21 (!) 150/84     2. Pulmonary emphysema, unspecified emphysema type ([place]) Is on combivent as needed.   3. Diverticulosis No recent flare ups.  4. Gastroesophageal reflux disease, unspecified whether esophagitis present Is on omeprazole and is doing well.  5. Hyperlipidemia with target LDL less than 100 Does try to watch diet. Does no dedicated exercise. Lab Results  Component Value Date   CHOL 169 07/07/2021   HDL 49 07/07/2021   LDLCALC 99 07/07/2021   TRIG 117 07/07/2021   CHOLHDL 3.4 07/07/2021     6. PANIC DISORDER 7. GAD (generalized anxiety disorder) Is on xanax BID and is doing well.    01/09/2022    9:22 AM 07/07/2021    8:34 AM 02/25/2021   11:25 AM 01/11/2021   10:00 AM  GAD 7 : Generalized Anxiety Score  Nervous, Anxious, on Edge 0 0 0 0  Control/stop worrying 0 0 0 0  Worry too much - different things 0 0 0 0  Trouble relaxing 0 0 0 0  Restless 0 0 0 0  Easily annoyed or irritable 0 0 0 0  Afraid - awful might happen 0 0 0 0  Total GAD 7 Score 0 0 0 0  Anxiety Difficulty Not difficult at all Not difficult at all Not difficult at all Not difficult at all      8. Back pain without sciatica No pain currently  9. BMI 29.0-29.9,adult Weight is up 3 lbs Wt Readings from Last 3 Encounters:  01/09/22 178 lb (80.7 kg)  09/28/21 175 lb (79.4 kg)  07/07/21 173 lb (78.5 kg)    BMI Readings from Last 3 Encounters:  01/09/22 30.55 kg/m  09/28/21 30.04 kg/m  07/07/21 29.70 kg/m     New complaints: None today  No Known Allergies Outpatient Encounter Medications as of 01/09/2022  Medication Sig   albuterol (PROAIR HFA) 108 (90 Base) MCG/ACT inhaler Inhale 2 puffs into the lungs every 6 (six) hours as needed.   ALPRAZolam (XANAX) 0.5 MG tablet Take 1 tablet (0.5 mg total) by mouth daily.   amLODipine (NORVASC) 5 MG tablet Take 1 tablet (5 mg total) by mouth daily.   diclofenac Sodium (VOLTAREN) 1 % GEL Apply 4 g topically 4 (four) times daily.   fluticasone-salmeterol (ADVAIR DISKUS) 100-50 MCG/ACT AEPB INHALE 1 DOSE BY MOUTH TWICE DAILY   Ipratropium-Albuterol (COMBIVENT) 20-100 MCG/ACT AERS respimat Inhale 1 puff into the lungs every 6 (six) hours.   lisinopril-hydrochlorothiazide (ZESTORETIC) 20-12.5 MG tablet Take 2 tablets by mouth daily.   Multiple Vitamin (MULTIVITAMIN WITH MINERALS) TABS tablet Take 1 tablet by mouth daily.   omeprazole (PRILOSEC) 20 MG capsule Take 1 capsule (20 mg total) by mouth daily.   No facility-administered encounter medications on file as of 01/09/2022.    Past Surgical History:  Procedure Laterality Date  TUBAL LIGATION      Family History  Problem Relation Age of Onset   Asthma Mother    Hypertension Mother    Dementia Mother    Diabetes Father    COPD Father    Diabetes Sister    Hypertension Sister    Diabetes Brother    Early death Brother        MVA with quadrapelgic   Diabetes Brother    Dementia Brother    Diabetes Brother    Cancer Sister        vulva   Colon cancer Neg Hx       Controlled substance contract: 01/09/22     Review of Systems  Constitutional:  Negative for diaphoresis.  Eyes:  Negative for pain.  Respiratory:  Negative for shortness of breath.   Cardiovascular:  Negative for chest pain, palpitations and leg swelling.  Gastrointestinal:  Negative for abdominal pain.   Endocrine: Negative for polydipsia.  Skin:  Negative for rash.  Neurological:  Negative for dizziness, weakness and headaches.  Hematological:  Does not bruise/bleed easily.  All other systems reviewed and are negative.      Objective:   Physical Exam Vitals and nursing note reviewed.  Constitutional:      General: She is not in acute distress.    Appearance: Normal appearance. She is well-developed.  HENT:     Head: Normocephalic.     Right Ear: Tympanic membrane normal.     Left Ear: Tympanic membrane normal.     Nose: Nose normal.     Mouth/Throat:     Mouth: Mucous membranes are moist.  Eyes:     Pupils: Pupils are equal, round, and reactive to light.  Neck:     Vascular: No carotid bruit or JVD.  Cardiovascular:     Rate and Rhythm: Normal rate and regular rhythm.     Heart sounds: Normal heart sounds.  Pulmonary:     Effort: Pulmonary effort is normal. No respiratory distress.     Breath sounds: Normal breath sounds. No wheezing or rales.  Chest:     Chest wall: No tenderness.  Abdominal:     General: Bowel sounds are normal. There is no distension or abdominal bruit.     Palpations: Abdomen is soft. There is no hepatomegaly, splenomegaly, mass or pulsatile mass.     Tenderness: There is no abdominal tenderness.  Musculoskeletal:        General: Normal range of motion.     Cervical back: Normal range of motion and neck supple.     Right lower leg: Edema (1+) present.     Left lower leg: Edema (1+) present.  Lymphadenopathy:     Cervical: No cervical adenopathy.  Skin:    General: Skin is warm and dry.  Neurological:     Mental Status: She is alert and oriented to person, place, and time.     Deep Tendon Reflexes: Reflexes are normal and symmetric.  Psychiatric:        Behavior: Behavior normal.        Thought Content: Thought content normal.        Judgment: Judgment normal.    BP (!) 149/76   Pulse 64   Temp 98.2 F (36.8 C) (Temporal)   Resp 20    Ht _0  (1.626 m)   Wt 178 lb (80.7 kg)   SpO2 93%   BMI 30.55 kg/m         Assessment &  Plan:   Gabriela Wilson comes in today with chief complaint of Medical Management of Chronic Issues   Diagnosis and orders addressed:  1. Essential hypertension, benign Low sodium diet - amLODipine (NORVASC) 5 MG tablet; Take 1 tablet (5 mg total) by mouth daily.  Dispense: 90 tablet; Refill: 1 - lisinopril-hydrochlorothiazide (ZESTORETIC) 20-12.5 MG tablet; Take 2 tablets by mouth daily.  Dispense: 180 tablet; Refill: 1 - CBC with Differential/Platelet - CMP14+EGFR  2. Pulmonary emphysema, unspecified emphysema type ([place]) Continue inhalers as needed  3. Diverticulosis Watch diet to prevent flare up  4. Gastroesophageal reflux disease, unspecified whether esophagitis present Avoid spicy foods Do not eat 2 hours prior to bedtime - omeprazole (PRILOSEC) 20 MG capsule; Take 1 capsule (20 mg total) by mouth daily.  Dispense: 90 capsule; Refill: 1  5. Hyperlipidemia with target LDL less than 100 Low fat diet - Lipid panel  6. PANIC DISORDER Stress management   7. Back pain without sciatica Rest Moist heat  8. GAD (generalized anxiety disorder) - ToxASSURE Select 13 (MW), Urine - ALPRAZolam (XANAX) 0.5 MG tablet; Take 1 tablet (0.5 mg total) by mouth daily.  Dispense: 30 tablet; Refill: 5  9. BMI 29.0-29.9,adult Discussed diet and exercise for person with BMI >25 Will recheck weight in 3-6 months    Labs pending Health Maintenance reviewed Diet and exercise encouraged  Follow up plan: 6 months   Mary-Margaret Hassell Done, FNP

## 2022-01-12 LAB — TOXASSURE SELECT 13 (MW), URINE

## 2022-04-03 ENCOUNTER — Other Ambulatory Visit: Payer: Self-pay | Admitting: Family

## 2022-04-04 ENCOUNTER — Encounter: Payer: Self-pay | Admitting: Nurse Practitioner

## 2022-04-04 ENCOUNTER — Ambulatory Visit (INDEPENDENT_AMBULATORY_CARE_PROVIDER_SITE_OTHER): Payer: PPO | Admitting: Nurse Practitioner

## 2022-04-04 VITALS — BP 149/75 | HR 77 | Temp 97.1°F | Resp 20 | Ht 64.0 in | Wt 173.0 lb

## 2022-04-04 DIAGNOSIS — J40 Bronchitis, not specified as acute or chronic: Secondary | ICD-10-CM | POA: Diagnosis not present

## 2022-04-04 MED ORDER — BENZONATATE 100 MG PO CAPS: 100.0000 mg | ORAL_CAPSULE | Freq: Three times a day (TID) | ORAL | 0 refills | Status: DC | PRN

## 2022-04-04 MED ORDER — PREDNISONE 20 MG PO TABS: 40.0000 mg | ORAL_TABLET | Freq: Every day | ORAL | 0 refills | Status: AC

## 2022-04-04 NOTE — Progress Notes (Signed)
Subjective:    Patient ID: Gabriela Wilson, female    DOB: 1945/03/22, 77 y.o.   MRN: [id number]   Chief Complaint: Nasal Congestion, Cough, and Fever   URI  This is a new problem. The current episode started in the past 7 days. The problem has been gradually improving. There has been no fever. Associated symptoms include congestion, coughing, headaches and rhinorrhea. Pertinent negatives include no sore throat. She has tried NSAIDs (nyquil and mucinex) for the symptoms. The treatment provided mild relief.    Patient Active Problem List   Diagnosis Date Noted   Diverticulosis 06/17/2020   Back pain without sciatica 06/17/2020   BMI 29.0-29.9,adult 07/31/2014   Osteopenia 06/10/2014   Gastroesophageal reflux disease 07/07/2013   Hyperlipidemia with target LDL less than 100 09/11/2012   GAD (generalized anxiety disorder) 05/08/2012   Essential hypertension, benign 05/08/2012   PANIC DISORDER 03/13/2008   ALLERGIC RHINITIS 03/13/2008   Emphysema/COPD ([place]) 03/13/2008       Review of Systems  Constitutional:  Positive for fatigue. Negative for chills and fever.  HENT:  Positive for congestion and rhinorrhea. Negative for sore throat.   Respiratory:  Positive for cough.   Neurological:  Positive for headaches.       Objective:   Physical Exam Vitals reviewed.  Constitutional:      Appearance: Normal appearance.  HENT:     Right Ear: Tympanic membrane normal.     Left Ear: Tympanic membrane normal.     Nose: Congestion and rhinorrhea present.     Mouth/Throat:     Mouth: Mucous membranes are moist.  Eyes:     Extraocular Movements: Extraocular movements intact.     Pupils: Pupils are equal, round, and reactive to light.  Cardiovascular:     Rate and Rhythm: Normal rate and regular rhythm.     Heart sounds: Normal heart sounds.  Pulmonary:     Effort: Pulmonary effort is normal.     Breath sounds: Wheezing (exp wheezes throughout) present.  Musculoskeletal:      Cervical back: Normal range of motion and neck supple.  Skin:    General: Skin is warm.  Neurological:     General: No focal deficit present.     Mental Status: She is alert and oriented to person, place, and time.  Psychiatric:        Mood and Affect: Mood normal.        Behavior: Behavior normal.     BP (!) 149/75   Pulse 77   Temp (!) 97.1 F (36.2 C) (Temporal)   Resp 20   Ht '5\' 4"'$  (1.626 m)   Wt 173 lb (78.5 kg)   SpO2 92%   BMI 29.70 kg/m        Assessment & Plan:  Gabriela Wilson in today with chief complaint of Nasal Congestion, Cough, and Fever  1. Bronchitis 1. Take meds as prescribed 2. Use a cool mist humidifier especially during the winter months and when heat has been humid. 3. Use saline nose sprays frequently 4. Saline irrigations of the nose can be very helpful if done frequently.  * 4X daily for 1 week*  * Use of a nettie pot can be helpful with this. Follow directions with this* 5. Drink plenty of fluids 6. Keep thermostat turn down low 7.For any cough or congestion- tessalon perles 8. For fever or aces or pains- take tylenol or ibuprofen appropriate for age and weight.  * for fevers greater  than 101 orally you may alternate ibuprofen and tylenol every  3 hours.   Meds ordered this encounter  Medications   predniSONE (DELTASONE) 20 MG tablet    Sig: Take 2 tablets (40 mg total) by mouth daily with breakfast for 5 days. 2 po daily for 5 days    Dispense:  10 tablet    Refill:  0    Order Specific Question:   Supervising Provider    Answer:   Caryl Pina A [id number]   benzonatate (TESSALON PERLES) 100 MG capsule    Sig: Take 1 capsule (100 mg total) by mouth 3 (three) times daily as needed for cough.    Dispense:  20 capsule    Refill:  0    Order Specific Question:   Supervising Provider    Answer:   Caryl Pina A [id number]       The above assessment and management plan was discussed with the patient. The patient verbalized  understanding of and has agreed to the management plan. Patient is aware to call the clinic if symptoms persist or worsen. Patient is aware when to return to the clinic for a follow-up visit. Patient educated on when it is appropriate to go to the emergency department.   Mary-Margaret Hassell Done, FNP

## 2022-04-04 NOTE — Patient Instructions (Addendum)

## 2022-05-08 ENCOUNTER — Telehealth: Payer: Self-pay | Admitting: Nurse Practitioner

## 2022-05-08 NOTE — Telephone Encounter (Signed)
Called patient to schedule Medicare Annual Wellness Visit (AWV). Left message for patient to call back and schedule Medicare Annual Wellness Visit (AWV).  Last date of AWV: 07/01/2021   Patient has [employer] ADVANTAGE (Calendar) can be scheduled sooner than  07/03/2022   Please schedule an appointment at any time with either Vernona Rieger or [place]. .  If any questions, please contact me at [phone].  Thank you,  Judeth Cornfield,  AMB Clinical Support [facility] AWV Program Direct Dial ??[phone]

## 2022-05-29 ENCOUNTER — Telehealth: Payer: Self-pay | Admitting: Nurse Practitioner

## 2022-05-29 NOTE — Telephone Encounter (Signed)
Contacted Gabriela Wilson to schedule their annual wellness visit. Appointment made for 06/08/2022.  Thank you,  Judeth Cornfield,  AMB Clinical Support [facility] AWV Program Direct Dial ??[phone]

## 2022-06-08 ENCOUNTER — Ambulatory Visit (INDEPENDENT_AMBULATORY_CARE_PROVIDER_SITE_OTHER): Payer: PPO

## 2022-06-08 VITALS — Ht 64.0 in | Wt 176.0 lb

## 2022-06-08 DIAGNOSIS — Z780 Asymptomatic menopausal state: Secondary | ICD-10-CM | POA: Diagnosis not present

## 2022-06-08 DIAGNOSIS — Z0000 Encounter for general adult medical examination without abnormal findings: Secondary | ICD-10-CM | POA: Diagnosis not present

## 2022-06-08 NOTE — Patient Instructions (Signed)
[name] , Thank you for taking time to come for your Medicare Wellness Visit. I appreciate your ongoing commitment to your health goals. Please review the following plan we discussed and let me know if I can assist you in the future.   These are the goals we discussed:  Goals      Exercise 150 minutes per week (moderate activity)     Patient Stated     07/01/2021  AWV Goal: Fall Prevention  Over the next year, patient will decrease their risk for falls by: Using assistive devices, such as a cane or walker, as needed Identifying fall risks within their home and correcting them by: Removing throw rugs Adding handrails to stairs or ramps Removing clutter and keeping a clear pathway throughout the home Increasing light, especially at night Adding shower handles/bars Raising toilet seat Identifying potential personal risk factors for falls: Medication side effects Incontinence/urgency Vestibular dysfunction Hearing loss Musculoskeletal disorders Neurological disorders Orthostatic hypotension       Reduce calorie intake to 1500 calories per day      Increase non-starchy vegetables - carrots, green bean, squash, zucchini, tomatoes, onions, peppers, spinach and other green leafy vegetables, cabbage, lettuce, cucumbers, asparagus, okra (not fried), eggplant limit sugar and processed foods (cakes, cookies, ice cream, crackers and chips) Increase fresh fruit but limit serving sizes 1/2 cup or about the size of tennis or baseball limit red meat to no more than 1-2 times per week (serving size about the size of your palm) Choose whole grains / lean proteins - whole wheat bread, quinoa, whole grain rice (1/2 cup), fish, chicken, [place]         This is a list of the screening recommended for you and due dates:  Health Maintenance  Topic Date Due   Pneumonia Vaccine (1 of 2 - PCV) Never done   DTaP/Tdap/Td vaccine (1 - Tdap) Never done   Zoster (Shingles) Vaccine (1 of 2) Never done    COVID-19 Vaccine (3 - Moderna risk series) 05/16/2019   Flu Shot  08/24/2022   Mammogram  12/03/2022   Medicare Annual Wellness Visit  06/08/2023   Hepatitis C Screening: USPSTF Recommendation to screen - Ages 18-79 yo.  Completed   HPV Vaccine  Aged Out   DEXA scan (bone density measurement)  Discontinued   Colon Cancer Screening  Discontinued    Advanced directives: Advance directive discussed with you today. I have provided a copy for you to complete at home and have notarized. Once this is complete please bring a copy in to our office so we can scan it into your chart.   Conditions/risks identified: Aim for 30 minutes of exercise or brisk walking, 6-8 glasses of water, and 5 servings of fruits and vegetables each day.   Next appointment: Follow up in one year for your annual wellness visit    Preventive Care 65 Years and Older, Female Preventive care refers to lifestyle choices and visits with your health care provider that can promote health and wellness. What does preventive care include? A yearly physical exam. This is also called an annual well check. Dental exams once or twice a year. Routine eye exams. Ask your health care provider how often you should have your eyes checked. Personal lifestyle choices, including: Daily care of your teeth and gums. Regular physical activity. Eating a healthy diet. Avoiding tobacco and drug use. Limiting alcohol use. Practicing safe sex. Taking low-dose aspirin every day. Taking vitamin and mineral supplements as recommended by  your health care provider. What happens during an annual well check? The services and screenings done by your health care provider during your annual well check will depend on your age, overall health, lifestyle risk factors, and family history of disease. Counseling  Your health care provider may ask you questions about your: Alcohol use. Tobacco use. Drug use. Emotional well-being. Home and relationship  well-being. Sexual activity. Eating habits. History of falls. Memory and ability to understand (cognition). Work and work Astronomer. Reproductive health. Screening  You may have the following tests or measurements: Height, weight, and BMI. Blood pressure. Lipid and cholesterol levels. These may be checked every 5 years, or more frequently if you are over 30 years old. Skin check. Lung cancer screening. You may have this screening every year starting at age 26 if you have a 30-pack-year history of smoking and currently smoke or have quit within the past 15 years. Fecal occult blood test (FOBT) of the stool. You may have this test every year starting at age 21. Flexible sigmoidoscopy or colonoscopy. You may have a sigmoidoscopy every 5 years or a colonoscopy every 10 years starting at age 74. Hepatitis C blood test. Hepatitis B blood test. Sexually transmitted disease (STD) testing. Diabetes screening. This is done by checking your blood sugar (glucose) after you have not eaten for a while (fasting). You may have this done every 1-3 years. Bone density scan. This is done to screen for osteoporosis. You may have this done starting at age 58. Mammogram. This may be done every 1-2 years. Talk to your health care provider about how often you should have regular mammograms. Talk with your health care provider about your test results, treatment options, and if necessary, the need for more tests. Vaccines  Your health care provider may recommend certain vaccines, such as: Influenza vaccine. This is recommended every year. Tetanus, diphtheria, and acellular pertussis (Tdap, Td) vaccine. You may need a Td booster every 10 years. Zoster vaccine. You may need this after age 28. Pneumococcal 13-valent conjugate (PCV13) vaccine. One dose is recommended after age 56. Pneumococcal polysaccharide (PPSV23) vaccine. One dose is recommended after age 54. Talk to your health care provider about which  screenings and vaccines you need and how often you need them. This information is not intended to replace advice given to you by your health care provider. Make sure you discuss any questions you have with your health care provider. Document Released: 02/05/2015 Document Revised: 09/29/2015 Document Reviewed: 11/10/2014 Elsevier Interactive Patient Education  2017 [employer].  Fall Prevention in the Home Falls can cause injuries. They can happen to people of all ages. There are many things you can do to make your home safe and to help prevent falls. What can I do on the outside of my home? Regularly fix the edges of walkways and driveways and fix any cracks. Remove anything that might make you trip as you walk through a door, such as a raised step or threshold. Trim any bushes or trees on the path to your home. Use bright outdoor lighting. Clear any walking paths of anything that might make someone trip, such as rocks or tools. Regularly check to see if handrails are loose or broken. Make sure that both sides of any steps have handrails. Any raised decks and porches should have guardrails on the edges. Have any leaves, snow, or ice cleared regularly. Use sand or salt on walking paths during winter. Clean up any spills in your garage right away. This  includes oil or grease spills. What can I do in the bathroom? Use night lights. Install grab bars by the toilet and in the tub and shower. Do not use towel bars as grab bars. Use non-skid mats or decals in the tub or shower. If you need to sit down in the shower, use a plastic, non-slip stool. Keep the floor dry. Clean up any water that spills on the floor as soon as it happens. Remove soap buildup in the tub or shower regularly. Attach bath mats securely with double-sided non-slip rug tape. Do not have throw rugs and other things on the floor that can make you trip. What can I do in the bedroom? Use night lights. Make sure that you have a  light by your bed that is easy to reach. Do not use any sheets or blankets that are too big for your bed. They should not hang down onto the floor. Have a firm chair that has side arms. You can use this for support while you get dressed. Do not have throw rugs and other things on the floor that can make you trip. What can I do in the kitchen? Clean up any spills right away. Avoid walking on wet floors. Keep items that you use a lot in easy-to-reach places. If you need to reach something above you, use a strong step stool that has a grab bar. Keep electrical cords out of the way. Do not use floor polish or wax that makes floors slippery. If you must use wax, use non-skid floor wax. Do not have throw rugs and other things on the floor that can make you trip. What can I do with my stairs? Do not leave any items on the stairs. Make sure that there are handrails on both sides of the stairs and use them. Fix handrails that are broken or loose. Make sure that handrails are as long as the stairways. Check any carpeting to make sure that it is firmly attached to the stairs. Fix any carpet that is loose or worn. Avoid having throw rugs at the top or bottom of the stairs. If you do have throw rugs, attach them to the floor with carpet tape. Make sure that you have a light switch at the top of the stairs and the bottom of the stairs. If you do not have them, ask someone to add them for you. What else can I do to help prevent falls? Wear shoes that: Do not have high heels. Have rubber bottoms. Are comfortable and fit you well. Are closed at the toe. Do not wear sandals. If you use a stepladder: Make sure that it is fully opened. Do not climb a closed stepladder. Make sure that both sides of the stepladder are locked into place. Ask someone to hold it for you, if possible. Clearly mark and make sure that you can see: Any grab bars or handrails. First and last steps. Where the edge of each step  is. Use tools that help you move around (mobility aids) if they are needed. These include: Canes. Walkers. Scooters. Crutches. Turn on the lights when you go into a dark area. Replace any light bulbs as soon as they burn out. Set up your furniture so you have a clear path. Avoid moving your furniture around. If any of your floors are uneven, fix them. If there are any pets around you, be aware of where they are. Review your medicines with your doctor. Some medicines can make you feel dizzy.  This can increase your chance of falling. Ask your doctor what other things that you can do to help prevent falls. This information is not intended to replace advice given to you by your health care provider. Make sure you discuss any questions you have with your health care provider. Document Released: 11/05/2008 Document Revised: 06/17/2015 Document Reviewed: 02/13/2014 Elsevier Interactive Patient Education  2017 [employer].

## 2022-06-08 NOTE — Progress Notes (Signed)
Subjective:   Gabriela Wilson is a 77 y.o. female who presents for Medicare Annual (Subsequent) preventive examination. I connected with  Gabriela Wilson on 06/08/22 by a audio enabled telemedicine application and verified that I am speaking with the correct person using two identifiers.  Patient Location: Home  Provider Location: Home Office  I discussed the limitations of evaluation and management by telemedicine. The patient expressed understanding and agreed to proceed.  Review of Systems     Cardiac Risk Factors include: advanced age (>55men, >26 women);hypertension;dyslipidemia     Objective:    Today's Vitals   06/08/22 1107  Weight: 176 lb (79.8 kg)  Height: 5\' 4"  (1.626 m)   Body mass index is 30.21 kg/m.     06/08/2022   11:09 AM 07/01/2021   12:09 PM 06/30/2020   11:17 AM 05/02/2017    9:18 AM 07/31/2014    8:41 AM 06/10/2014   11:11 AM  Advanced Directives  Does Patient Have a Medical Advance Directive? No Yes No No No No  Type of Special educational needs teacher of [place];Living will      Copy of Healthcare Power of Attorney in Chart?  No - copy requested      Would patient like information on creating a medical advance directive? No - Patient declined  No - Patient declined Yes (ED - Information included in AVS) Yes - Educational materials given Yes - Transport planner given;No - patient declined information    Current Medications (verified) Outpatient Encounter Medications as of 06/08/2022  Medication Sig   albuterol (PROAIR HFA) 108 (90 Base) MCG/ACT inhaler Inhale 2 puffs into the lungs every 6 (six) hours as needed.   ALPRAZolam (XANAX) 0.5 MG tablet Take 1 tablet (0.5 mg total) by mouth daily.   amLODipine (NORVASC) 5 MG tablet Take 1 tablet (5 mg total) by mouth daily.   benzonatate (TESSALON PERLES) 100 MG capsule Take 1 capsule (100 mg total) by mouth 3 (three) times daily as needed for cough.   fluticasone-salmeterol (ADVAIR DISKUS) 100-50 MCG/ACT  AEPB INHALE 1 DOSE BY MOUTH TWICE DAILY   Ipratropium-Albuterol (COMBIVENT) 20-100 MCG/ACT AERS respimat Inhale 1 puff into the lungs every 6 (six) hours.   lisinopril-hydrochlorothiazide (ZESTORETIC) 20-12.5 MG tablet Take 2 tablets by mouth daily.   Multiple Vitamin (MULTIVITAMIN WITH MINERALS) TABS tablet Take 1 tablet by mouth daily.   omeprazole (PRILOSEC) 20 MG capsule Take 1 capsule (20 mg total) by mouth daily.   No facility-administered encounter medications on file as of 06/08/2022.    Allergies (verified) Patient has no known allergies.   History: Past Medical History:  Diagnosis Date   Asthma    COPD (chronic obstructive pulmonary disease) (HCC)    Hyperlipidemia    Hypertension    Osteopenia    Past Surgical History:  Procedure Laterality Date   TUBAL LIGATION     Family History  Problem Relation Age of Onset   Asthma Mother    Hypertension Mother    Dementia Mother    Diabetes Father    COPD Father    Diabetes Sister    Hypertension Sister    Diabetes Brother    Early death Brother        MVA with quadrapelgic   Diabetes Brother    Dementia Brother    Diabetes Brother    Cancer Sister        vulva   Colon cancer Neg Hx    Social History  Socioeconomic History   Marital status: Married    Spouse name: Not on file   Number of children: 3   Years of education: Not on file   Highest education level: 11th grade  Occupational History   Occupation: retired    Associate Professor: Engineer, materials    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  Tobacco Use   Smoking status: Former    Types: Cigarettes    Quit date: 01/24/1992    Years since quitting: 30.3   Smokeless tobacco: Never  Vaping Use   Vaping Use: Never used  Substance and Sexual Activity   Alcohol use: Yes    Comment: wine about every 2 weeks   Drug use: No   Sexual activity: Yes  Other Topics Concern   Not on file  Social History Narrative   Very involved in church   Children live in [place], [place], and  [place]   Social Determinants of Health   Financial Resource Strain: Low Risk  (06/08/2022)   Overall Financial Resource Strain (CARDIA)    Difficulty of Paying Living Expenses: Not hard at all  Food Insecurity: No Food Insecurity (06/08/2022)   Hunger Vital Sign    Worried About Running Out of Food in the Last Year: Never true    Ran Out of Food in the Last Year: Never true  Transportation Needs: No Transportation Needs (07/01/2021)   PRAPARE - Administrator, Civil Service (Medical): No    Lack of Transportation (Non-Medical): No  Physical Activity: Insufficiently Active (06/08/2022)   Exercise Vital Sign    Days of Exercise per Week: 3 days    Minutes of Exercise per Session: 30 min  Stress: No Stress Concern Present (06/08/2022)   [employer] of Occupational Health - Occupational Stress Questionnaire    Feeling of Stress : Not at all  Social Connections: Socially Integrated (06/08/2022)   Social Connection and Isolation Panel [NHANES]    Frequency of Communication with Friends and Family: More than three times a week    Frequency of Social Gatherings with Friends and Family: More than three times a week    Attends Religious Services: More than 4 times per year    Active Member of [employer] or Organizations: Yes    Attends Engineer, structural: More than 4 times per year    Marital Status: Married    Tobacco Counseling Counseling given: Not Answered   Clinical Intake:  Pre-visit preparation completed: Yes  Pain : No/denies pain     Nutritional Risks: None Diabetes: No  How often do you need to have someone help you when you read instructions, pamphlets, or other written materials from your doctor or pharmacy?: 1 - Never  Diabetic?no   Interpreter Needed?: No  Information entered by :: Renie Ora, LPN   Activities of Daily Living    06/08/2022   11:09 AM 07/01/2021   12:09 PM  In your present state of health, do you have any difficulty  performing the following activities:  Hearing? 0 0  Vision? 0 0  Difficulty concentrating or making decisions? 0 0  Walking or climbing stairs? 0 0  Dressing or bathing? 0 0  Doing errands, shopping? 0 0  Preparing Food and eating ? N N  Using the Toilet? N N  In the past six months, have you accidently leaked urine? N N  Do you have problems with loss of bowel control? N N  Managing your Medications? N N  Managing your Finances? N  N  Housekeeping or managing your Housekeeping? N N    Patient Care Team: Bennie Pierini, FNP as PCP - General (Nurse Practitioner) Janalyn Harder, MD (Inactive) as Consulting Physician (Dermatology)  Indicate any recent Medical Services you may have received from other than [facility] providers in the past year (date may be approximate).     Assessment:   This is a routine wellness examination for [place].  Hearing/Vision screen Vision Screening - Comments:: Wears rx glasses - up to date with routine eye exams with  Dr.Cotter   Dietary issues and exercise activities discussed: Current Exercise Habits: Home exercise routine, Type of exercise: walking, Time (Minutes): 30, Frequency (Times/Week): 3, Weekly Exercise (Minutes/Week): 90, Exercise limited by: None identified   Goals Addressed             This Visit's Progress    Exercise 150 minutes per week (moderate activity)   On track      Depression Screen    06/08/2022   11:09 AM 04/04/2022    8:58 AM 01/09/2022    9:22 AM 09/28/2021    2:02 PM 07/07/2021    8:34 AM 07/01/2021   12:07 PM 02/25/2021   11:24 AM  PHQ 2/9 Scores  PHQ - 2 Score 0 0 0 0 0 0 0  PHQ- 9 Score  0 0  0  1    Fall Risk    06/08/2022   11:08 AM 04/04/2022    8:58 AM 01/09/2022    9:21 AM 09/28/2021    2:02 PM 07/07/2021    8:34 AM  Fall Risk   Falls in the past year? 0 0 0 0 0  Number falls in past yr: 0      Injury with Fall? 0      Risk for fall due to : No Fall Risks      Follow up Falls prevention discussed         FALL RISK PREVENTION PERTAINING TO THE HOME:  Any stairs in or around the home? No  If so, are there any without handrails? No  Home free of loose throw rugs in walkways, pet beds, electrical cords, etc? Yes  Adequate lighting in your home to reduce risk of falls? Yes   ASSISTIVE DEVICES UTILIZED TO PREVENT FALLS:  Life alert? No  Use of a cane, walker or w/c? No  Grab bars in the bathroom? Yes  Shower chair or bench in shower? Yes  Elevated toilet seat or a handicapped toilet? Yes       05/02/2017    9:20 AM 06/10/2014   11:14 AM  MMSE - Mini Mental State Exam  Orientation to time 5 5  Orientation to Place 5 5  Registration 3 3  Attention/ Calculation 5 4  Recall 3 3  Language- name 2 objects 2 2  Language- repeat 1 1  Language- follow 3 step command 3 3  Language- read & follow direction 1 1  Write a sentence 1 1  Copy design 1 1  Total score 30 29        06/08/2022   11:10 AM 07/01/2021   12:10 PM 06/30/2020   11:18 AM  6CIT Screen  What Year? 0 points 0 points 0 points  What month? 0 points 0 points 0 points  What time? 0 points 0 points 0 points  Count back from 20 0 points 0 points 0 points  Months in reverse 0 points 2 points 0 points  Repeat phrase 0  points 0 points 0 points  Total Score 0 points 2 points 0 points    Immunizations Immunization History  Administered Date(s) Administered   Fluad Quad(high Dose 65+) 11/22/2018, 10/13/2019, 11/02/2020, 01/09/2022   Influenza, High Dose Seasonal PF 10/20/2015, 11/14/2016, 12/17/2017   Influenza,inj,Quad PF,6+ Mos 12/13/2012, 11/18/2013, 11/24/2014   Moderna Sars-Covid-2 Vaccination 03/20/2019, 04/18/2019    TDAP status: Due, Education has been provided regarding the importance of this vaccine. Advised may receive this vaccine at local pharmacy or Health Dept. Aware to provide a copy of the vaccination record if obtained from local pharmacy or Health Dept. Verbalized acceptance and understanding.  Flu  Vaccine status: Up to date  Pneumococcal vaccine status: Due, Education has been provided regarding the importance of this vaccine. Advised may receive this vaccine at local pharmacy or Health Dept. Aware to provide a copy of the vaccination record if obtained from local pharmacy or Health Dept. Verbalized acceptance and understanding.  Covid-19 vaccine status: Completed vaccines  Qualifies for Shingles Vaccine? Yes   Zostavax completed No   Shingrix Completed?: No.    Education has been provided regarding the importance of this vaccine. Patient has been advised to call insurance company to determine out of pocket expense if they have not yet received this vaccine. Advised may also receive vaccine at local pharmacy or Health Dept. Verbalized acceptance and understanding.  Screening Tests Health Maintenance  Topic Date Due   Pneumonia Vaccine 5+ Years old (1 of 2 - PCV) Never done   DTaP/Tdap/Td (1 - Tdap) Never done   Zoster Vaccines- Shingrix (1 of 2) Never done   COVID-19 Vaccine (3 - Moderna risk series) 05/16/2019   INFLUENZA VACCINE  08/24/2022   MAMMOGRAM  12/03/2022   Medicare Annual Wellness (AWV)  06/08/2023   Hepatitis C Screening  Completed   HPV VACCINES  Aged Out   DEXA SCAN  Discontinued   COLONOSCOPY (Pts 45-70yrs Insurance coverage will need to be confirmed)  Discontinued    Health Maintenance  Health Maintenance Due  Topic Date Due   Pneumonia Vaccine 27+ Years old (1 of 2 - PCV) Never done   DTaP/Tdap/Td (1 - Tdap) Never done   Zoster Vaccines- Shingrix (1 of 2) Never done   COVID-19 Vaccine (3 - Moderna risk series) 05/16/2019    Colorectal cancer screening: No longer required.   Mammogram status: No longer required due to age.  Bone Density status: Ordered 06/08/2022. Pt provided with contact info and advised to call to schedule appt.  Lung Cancer Screening: (Low Dose CT Chest recommended if Age 47-80 years, 30 pack-year currently smoking OR have quit  w/in 15years.) does not qualify.   Lung Cancer Screening Referral: n/a  Additional Screening:  Hepatitis C Screening: does not qualify; Completed 02/05/2015  Vision Screening: Recommended annual ophthalmology exams for early detection of glaucoma and other disorders of the eye. Is the patient up to date with their annual eye exam?  Yes  Who is the provider or what is the name of the office in which the patient attends annual eye exams? Dr.Cotter  If pt is not established with a provider, would they like to be referred to a provider to establish care? No .   Dental Screening: Recommended annual dental exams for proper oral hygiene  Community Resource Referral / Chronic Care Management: CRR required this visit?  No   CCM required this visit?  No      Plan:     I have personally reviewed and  noted the following in the patient's chart:   Medical and social history Use of alcohol, tobacco or illicit drugs  Current medications and supplements including opioid prescriptions. Patient is not currently taking opioid prescriptions. Functional ability and status Nutritional status Physical activity Advanced directives List of other physicians Hospitalizations, surgeries, and ER visits in previous 12 months Vitals Screenings to include cognitive, depression, and falls Referrals and appointments  In addition, I have reviewed and discussed with patient certain preventive protocols, quality metrics, and best practice recommendations. A written personalized care plan for preventive services as well as general preventive health recommendations were provided to patient.     Lorrene Reid, LPN   1/61/0960   Nurse Notes: Due TDAP Yevonne Aline Vaccine

## 2022-07-13 ENCOUNTER — Ambulatory Visit (INDEPENDENT_AMBULATORY_CARE_PROVIDER_SITE_OTHER): Payer: PPO

## 2022-07-13 ENCOUNTER — Ambulatory Visit (INDEPENDENT_AMBULATORY_CARE_PROVIDER_SITE_OTHER): Payer: PPO | Admitting: Nurse Practitioner

## 2022-07-13 ENCOUNTER — Encounter: Payer: Self-pay | Admitting: Nurse Practitioner

## 2022-07-13 VITALS — BP 134/60 | HR 71 | Temp 98.1°F | Resp 20 | Ht 64.0 in | Wt 177.0 lb

## 2022-07-13 DIAGNOSIS — F411 Generalized anxiety disorder: Secondary | ICD-10-CM

## 2022-07-13 DIAGNOSIS — E785 Hyperlipidemia, unspecified: Secondary | ICD-10-CM | POA: Diagnosis not present

## 2022-07-13 DIAGNOSIS — K219 Gastro-esophageal reflux disease without esophagitis: Secondary | ICD-10-CM

## 2022-07-13 DIAGNOSIS — Z780 Asymptomatic menopausal state: Secondary | ICD-10-CM | POA: Diagnosis not present

## 2022-07-13 DIAGNOSIS — M8588 Other specified disorders of bone density and structure, other site: Secondary | ICD-10-CM

## 2022-07-13 DIAGNOSIS — I10 Essential (primary) hypertension: Secondary | ICD-10-CM | POA: Diagnosis not present

## 2022-07-13 DIAGNOSIS — K5790 Diverticulosis of intestine, part unspecified, without perforation or abscess without bleeding: Secondary | ICD-10-CM | POA: Diagnosis not present

## 2022-07-13 DIAGNOSIS — J438 Other emphysema: Secondary | ICD-10-CM

## 2022-07-13 DIAGNOSIS — F410 Panic disorder [episodic paroxysmal anxiety] without agoraphobia: Secondary | ICD-10-CM

## 2022-07-13 DIAGNOSIS — Z6829 Body mass index (BMI) 29.0-29.9, adult: Secondary | ICD-10-CM

## 2022-07-13 DIAGNOSIS — J439 Emphysema, unspecified: Secondary | ICD-10-CM

## 2022-07-13 LAB — LIPID PANEL
Chol/HDL Ratio: 3.7 ratio (ref 0.0–4.4)
Cholesterol, Total: 194 mg/dL (ref 100–199)
HDL: 53 mg/dL (ref >39)
LDL Chol Calc (NIH): 118 mg/dL — ABNORMAL HIGH (ref 0–99)
Triglycerides: 131 mg/dL (ref 0–149)
VLDL Cholesterol Cal: 23 mg/dL (ref 5–40)

## 2022-07-13 LAB — CBC WITH DIFFERENTIAL/PLATELET
Basophils Absolute: 0.1 10*3/uL (ref 0.0–0.2)
Basos: 1 %
EOS (ABSOLUTE): 0.2 10*3/uL (ref 0.0–0.4)
Eos: 3 %
Hematocrit: 42.6 % (ref 34.0–46.6)
Hemoglobin: 14.3 g/dL (ref 11.1–15.9)
Immature Grans (Abs): 0 10*3/uL (ref 0.0–0.1)
Immature Granulocytes: 0 %
Lymphocytes Absolute: 1.8 10*3/uL (ref 0.7–3.1)
Lymphs: 30 %
MCH: 30.6 pg (ref 26.6–33.0)
MCHC: 33.6 g/dL (ref 31.5–35.7)
MCV: 91 fL (ref 79–97)
Monocytes Absolute: 0.4 10*3/uL (ref 0.1–0.9)
Monocytes: 6 %
Neutrophils Absolute: 3.5 10*3/uL (ref 1.4–7.0)
Neutrophils: 60 %
Platelets: 240 10*3/uL (ref 150–450)
RBC: 4.67 x10E6/uL (ref 3.77–5.28)
RDW: 12.9 % (ref 11.7–15.4)
WBC: 5.9 10*3/uL (ref 3.4–10.8)

## 2022-07-13 LAB — CMP14+EGFR
ALT: 21 IU/L (ref 0–32)
AST: 21 IU/L (ref 0–40)
Albumin: 4.7 g/dL (ref 3.8–4.8)
Alkaline Phosphatase: 57 IU/L (ref 44–121)
BUN/Creatinine Ratio: 13 (ref 12–28)
BUN: 15 mg/dL (ref 8–27)
Bilirubin Total: 0.6 mg/dL (ref 0.0–1.2)
CO2: 26 mmol/L (ref 20–29)
Calcium: 10.2 mg/dL (ref 8.7–10.3)
Chloride: 101 mmol/L (ref 96–106)
Creatinine, Ser: 1.18 mg/dL — ABNORMAL HIGH (ref 0.57–1.00)
Globulin, Total: 2.1 g/dL (ref 1.5–4.5)
Glucose: 108 mg/dL — ABNORMAL HIGH (ref 70–99)
Potassium: 4.4 mmol/L (ref 3.5–5.2)
Sodium: 140 mmol/L (ref 134–144)
Total Protein: 6.8 g/dL (ref 6.0–8.5)
eGFR: 48 mL/min/{1.73_m2} — ABNORMAL LOW (ref >59)

## 2022-07-13 MED ORDER — LISINOPRIL-HYDROCHLOROTHIAZIDE 20-12.5 MG PO TABS
2.0000 | ORAL_TABLET | Freq: Every day | ORAL | 1 refills | Status: DC
Start: 2022-07-13 — End: 2023-01-12

## 2022-07-13 MED ORDER — OMEPRAZOLE 20 MG PO CPDR
20.0000 mg | DELAYED_RELEASE_CAPSULE | Freq: Every day | ORAL | 1 refills | Status: DC
Start: 2022-07-13 — End: 2023-01-12

## 2022-07-13 MED ORDER — IPRATROPIUM-ALBUTEROL 20-100 MCG/ACT IN AERS
1.0000 | INHALATION_SPRAY | Freq: Four times a day (QID) | RESPIRATORY_TRACT | 1 refills | Status: DC
Start: 2022-07-13 — End: 2023-01-12

## 2022-07-13 MED ORDER — AMLODIPINE BESYLATE 5 MG PO TABS: 5.0000 mg | ORAL_TABLET | Freq: Every day | ORAL | 1 refills | Status: DC

## 2022-07-13 MED ORDER — CIPROFLOXACIN HCL 500 MG PO TABS
500.0000 mg | ORAL_TABLET | Freq: Two times a day (BID) | ORAL | 0 refills | Status: DC
Start: 2022-07-13 — End: 2023-01-12

## 2022-07-13 MED ORDER — METRONIDAZOLE 500 MG PO TABS
500.0000 mg | ORAL_TABLET | Freq: Two times a day (BID) | ORAL | 0 refills | Status: DC
Start: 2022-07-13 — End: 2023-01-12

## 2022-07-13 MED ORDER — ALPRAZOLAM 0.5 MG PO TABS: 0.5000 mg | ORAL_TABLET | Freq: Every day | ORAL | 5 refills | Status: DC

## 2022-07-13 NOTE — Patient Instructions (Signed)
Bone Health Bones protect organs, store calcium, anchor muscles, and support the whole body. Keeping your bones strong is important, especially as you get older. You can take actions to help keep your bones strong and healthy. Why is keeping my bones healthy important?  Keeping your bones healthy is important because your body constantly replaces bone cells. Cells get old, and new cells take their place. As we age, we lose bone cells because the body may not be able to make enough new cells to replace the old cells. The amount of bone cells and bone tissue you have is referred to as bone mass. The higher your bone mass, the stronger your bones. The aging process leads to an overall loss of bone mass in the body, which can increase the likelihood of: Broken bones. A condition in which the bones become weak and brittle (osteoporosis). A large decline in bone mass occurs in older adults. In women, it occurs about the time of menopause. What actions can I take to keep my bones healthy? Good health habits are important for maintaining healthy bones. This includes eating nutritious foods and exercising regularly. To have healthy bones, you need to get enough of the right minerals and vitamins. Most nutrition experts recommend getting these nutrients from the foods that you eat. In some cases, taking supplements may also be recommended. Doing certain types of exercise is also important for bone health. What are the nutritional recommendations for healthy bones?  Eating a well-balanced diet with plenty of calcium and vitamin D will help to protect your bones. Nutritional recommendations vary from person to person. Ask your health care provider what is healthy for you. Here are some general guidelines. Get enough calcium Calcium is the most important (essential) mineral for bone health. Most people can get enough calcium from their diet, but supplements may be recommended for people who are at risk for  osteoporosis. Good sources of calcium include: Dairy products, such as low-fat or nonfat milk, cheese, and yogurt. Dark green leafy vegetables, such as bok choy and broccoli. Foods that have calcium added to them (are fortified). Foods that may be fortified with calcium include orange juice, cereal, bread, soy beverages, and tofu products. Nuts, such as almonds. Follow these recommended amounts for daily calcium intake: Infants, 0-6 months: 200 mg. Infants, 6-12 months: 260 mg. Children, age 1-3: 700 mg. Children, age 4-8: 1,000 mg. Children, age 9-13: 1,300 mg. Teens, age 14-18: 1,300 mg. Adults, age 19-50: 1,000 mg. Adults, age 51-70: Men: 1,000 mg. Women: 1,200 mg. Adults, age 71 or older: 1,200 mg. Pregnant and breastfeeding females: Teens: 1,300 mg. Adults: 1,000 mg. Get enough vitamin D Vitamin D is the most essential vitamin for bone health. It helps the body absorb calcium. Sunlight stimulates the skin to make vitamin D, so be sure to get enough sunlight. If you live in a cold climate or you do not get outside often, your health care provider may recommend that you take vitamin D supplements. Good sources of vitamin D in your diet include: Egg yolks. Saltwater fish. Milk and cereal fortified with vitamin D. Follow these recommended amounts for daily vitamin D intake: Infants, 0-12 months: 400 international units (IU). Children and teens, age 1-18: 600 international units. Adults, age 59 or younger: 600 international units. Adults, age 60 or older: 600-1,000 international units. Get other important nutrients Other nutrients that are important for bone health include: Phosphorus. This mineral is found in meat, poultry, dairy foods, nuts, and legumes. The   recommended daily intake for adult men and adult women is 700 mg. Magnesium. This mineral is found in seeds, nuts, dark green vegetables, and legumes. The recommended daily intake for adult men is 400-420 mg. For adult women,  it is 310-320 mg. Vitamin K. This vitamin is found in green leafy vegetables. The recommended daily intake is 120 mcg for adult men and 90 mcg for adult women. What type of physical activity is best for building and maintaining healthy bones? Weight-bearing and strength-building activities are important for building and maintaining healthy bones. Weight-bearing activities cause muscles and bones to work against gravity. Strength-building activities increase the strength of the muscles that support bones. Weight-bearing and muscle-building activities include: Walking and hiking. Jogging and running. Dancing. Gym exercises. Lifting weights. Tennis and racquetball. Climbing stairs. Aerobics. Adults should get at least 30 minutes of moderate physical activity on most days. Children should get at least 60 minutes of moderate physical activity on most days. Ask your health care provider what type of exercise is best for you. How can I find out if my bone mass is low? Bone mass can be measured with an X-ray test called a bone mineral density (BMD) test. This test is recommended for all women who are age 65 or older. It may also be recommended for: Men who are age 70 or older. People who are at risk for osteoporosis because of: Having a long-term disease that weakens bones, such as kidney disease or rheumatoid arthritis. Having menopause earlier than normal. Taking medicine that weakens bones, such as steroids, thyroid hormones, or hormone treatment for breast cancer or prostate cancer. Smoking. Drinking three or more alcoholic drinks a day. Being underweight. Sedentary lifestyle. If you find that you have a low bone mass, you may be able to prevent osteoporosis or further bone loss by changing your diet and lifestyle. Where can I find more information? Bone Health & Osteoporosis Foundation: www.nof.org/patients National Institutes of Health: www.bones.nih.gov International Osteoporosis  Foundation: www.iofbonehealth.org Summary The aging process leads to an overall loss of bone mass in the body, which can increase the likelihood of broken bones and osteoporosis. Eating a well-balanced diet with plenty of calcium and vitamin D will help to protect your bones. Weight-bearing and strength-building activities are also important for building and maintaining strong bones. Bone mass can be measured with an X-ray test called a bone mineral density (BMD) test. This information is not intended to replace advice given to you by your health care provider. Make sure you discuss any questions you have with your health care provider. Document Revised: 06/23/2020 Document Reviewed: 06/23/2020 Elsevier Patient Education  2024 [employer]  

## 2022-07-13 NOTE — Progress Notes (Signed)
Subjective:    Patient ID: Gabriela Wilson, female    DOB: 1945-10-28, 77 y.o.   MRN: [id number]   Chief Complaint: medical management of chronic issues     HPI:  Gabriela Wilson is a 77 y.o. who identifies as a female who was assigned female at birth.   Social history: Lives with: husband Work history: retired   Water engineer in today for follow up of the following chronic medical issues:  1. Essential hypertension, benign No c/o chest pain, sob or headache. Does  check blood pressure at home and they are running in 120's systolic on average. BP Readings from Last 3 Encounters:  04/04/22 (!) 149/75  01/09/22 (!) 149/76  09/28/21 112/67     2. Hyperlipidemia with target LDL less than 100 Does try to watch tries to exercise in her pool daily Lab Results  Component Value Date   CHOL 195 01/09/2022   HDL 52 01/09/2022   LDLCALC 107 (H) 01/09/2022   TRIG 209 (H) 01/09/2022   CHOLHDL 3.8 01/09/2022     3. Diverticulosis Is currently having a flare up- has been eating watermelon and she thinks the white seeds have caused flare up.  4. Gastroesophageal reflux disease, unspecified whether esophagitis present Is on omeprazole daily  5. Pulmonary emphysema, unspecified emphysema type (HCC) Denies any breathing problems. Has not seen pulmonology in awhile. Does use advair daily  6. GAD (generalized anxiety disorder) 7. PANIC DISORDER Is on xanax and takes 1x daily    07/13/2022    9:04 AM 04/04/2022    8:58 AM 01/09/2022    9:22 AM 07/07/2021    8:34 AM  GAD 7 : Generalized Anxiety Score  Nervous, Anxious, on Edge 0 0 0 0  Control/stop worrying 0 0 0 0  Worry too much - different things 0 0 0 0  Trouble relaxing 0 0 0 0  Restless 0 0 0 0  Easily annoyed or irritable 0 0 0 0  Afraid - awful might happen 0 0 0 0  Total GAD 7 Score 0 0 0 0  Anxiety Difficulty Not difficult at all Not difficult at all Not difficult at all Not difficult at all      07/13/2022    9:03 AM  06/08/2022   11:09 AM 04/04/2022    8:58 AM  Depression screen PHQ 2/9  Decreased Interest 0 0 0  Down, Depressed, Hopeless 0 0 0  PHQ - 2 Score 0 0 0  Altered sleeping 0  0  Tired, decreased energy 0  0  Change in appetite 0  0  Feeling bad or failure about yourself  0  0  Trouble concentrating 0  0  Moving slowly or fidgety/restless 0  0  Suicidal thoughts 0  0  PHQ-9 Score 0  0  Difficult doing work/chores Not difficult at all  Not difficult at all      8. Osteopenia of lumbar spine Is due to have dexascan repeated today  9. BMI 29.0-29.9,adult No recent weight changes Wt Readings from Last 3 Encounters:  07/13/22 177 lb (80.3 kg)  06/08/22 176 lb (79.8 kg)  04/04/22 173 lb (78.5 kg)   BMI Readings from Last 3 Encounters:  07/13/22 30.38 kg/m  06/08/22 30.21 kg/m  04/04/22 29.70 kg/m      New complaints: None today  No Known Allergies Outpatient Encounter Medications as of 07/13/2022  Medication Sig   albuterol (PROAIR HFA) 108 (90 Base) MCG/ACT inhaler Inhale 2  puffs into the lungs every 6 (six) hours as needed.   ALPRAZolam (XANAX) 0.5 MG tablet Take 1 tablet (0.5 mg total) by mouth daily.   amLODipine (NORVASC) 5 MG tablet Take 1 tablet (5 mg total) by mouth daily.   benzonatate (TESSALON PERLES) 100 MG capsule Take 1 capsule (100 mg total) by mouth 3 (three) times daily as needed for cough.   fluticasone-salmeterol (ADVAIR DISKUS) 100-50 MCG/ACT AEPB INHALE 1 DOSE BY MOUTH TWICE DAILY   Ipratropium-Albuterol (COMBIVENT) 20-100 MCG/ACT AERS respimat Inhale 1 puff into the lungs every 6 (six) hours.   lisinopril-hydrochlorothiazide (ZESTORETIC) 20-12.5 MG tablet Take 2 tablets by mouth daily.   Multiple Vitamin (MULTIVITAMIN WITH MINERALS) TABS tablet Take 1 tablet by mouth daily.   omeprazole (PRILOSEC) 20 MG capsule Take 1 capsule (20 mg total) by mouth daily.   No facility-administered encounter medications on file as of 07/13/2022.    Past Surgical  History:  Procedure Laterality Date   TUBAL LIGATION      Family History  Problem Relation Age of Onset   Asthma Mother    Hypertension Mother    Dementia Mother    Diabetes Father    COPD Father    Diabetes Sister    Hypertension Sister    Diabetes Brother    Early death Brother        MVA with quadrapelgic   Diabetes Brother    Dementia Brother    Diabetes Brother    Cancer Sister        vulva   Colon cancer Neg Hx       Controlled substance contract: n/a     Review of Systems  Constitutional:  Negative for diaphoresis.  Eyes:  Negative for pain.  Respiratory:  Positive for wheezing. Negative for shortness of breath.   Cardiovascular:  Negative for chest pain, palpitations and leg swelling.  Gastrointestinal:  Negative for abdominal pain.  Endocrine: Negative for polydipsia.  Skin:  Negative for rash.  Neurological:  Negative for dizziness, weakness and headaches.  Hematological:  Does not bruise/bleed easily.  All other systems reviewed and are negative.      Objective:   Physical Exam Vitals and nursing note reviewed.  Constitutional:      General: She is not in acute distress.    Appearance: Normal appearance. She is well-developed.  HENT:     Head: Normocephalic.     Right Ear: Tympanic membrane normal.     Left Ear: Tympanic membrane normal.     Nose: Nose normal.     Mouth/Throat:     Mouth: Mucous membranes are moist.  Eyes:     Pupils: Pupils are equal, round, and reactive to light.  Neck:     Vascular: No carotid bruit or JVD.  Cardiovascular:     Rate and Rhythm: Normal rate and regular rhythm.     Heart sounds: Normal heart sounds.  Pulmonary:     Effort: Pulmonary effort is normal. No respiratory distress.     Breath sounds: Normal breath sounds. No wheezing or rales.  Chest:     Chest wall: No tenderness.  Abdominal:     General: Bowel sounds are normal. There is no distension or abdominal bruit.     Palpations: Abdomen is soft.  There is no hepatomegaly, splenomegaly, mass or pulsatile mass.     Tenderness: There is no abdominal tenderness.  Musculoskeletal:        General: Normal range of motion.  Cervical back: Normal range of motion and neck supple.  Lymphadenopathy:     Cervical: No cervical adenopathy.  Skin:    General: Skin is warm and dry.  Neurological:     Mental Status: She is alert and oriented to person, place, and time.     Deep Tendon Reflexes: Reflexes are normal and symmetric.  Psychiatric:        Behavior: Behavior normal.        Thought Content: Thought content normal.        Judgment: Judgment normal.    BP 134/60   Pulse 71   Temp 98.1 F (36.7 C) (Temporal)   Resp 20   Ht 5\' 4"  (1.626 m)   Wt 177 lb (80.3 kg)   SpO2 97%   BMI 30.38 kg/m         Assessment & Plan:   Gabriela Wilson comes in today with chief complaint of Medical Management of Chronic Issues and Diverticulitis flare up   Diagnosis and orders addressed:  1. Essential hypertension, benign Low sodium diet - amLODipine (NORVASC) 5 MG tablet; Take 1 tablet (5 mg total) by mouth daily.  Dispense: 90 tablet; Refill: 1 - lisinopril-hydrochlorothiazide (ZESTORETIC) 20-12.5 MG tablet; Take 2 tablets by mouth daily.  Dispense: 180 tablet; Refill: 1  2. Hyperlipidemia with target LDL less than 100 Low fat diet  3. Diverticulosis Watch diet to prevent flare up - ciprofloxacin (CIPRO) 500 MG tablet; Take 1 tablet (500 mg total) by mouth 2 (two) times daily.  Dispense: 14 tablet; Refill: 0 - metroNIDAZOLE (FLAGYL) 500 MG tablet; Take 1 tablet (500 mg total) by mouth 2 (two) times daily.  Dispense: 14 tablet; Refill: 0  4. Gastroesophageal reflux disease, unspecified whether esophagitis present Avoid spicy foods Do not eat 2 hours prior to bedtime  - omeprazole (PRILOSEC) 20 MG capsule; Take 1 capsule (20 mg total) by mouth daily.  Dispense: 90 capsule; Refill: 1  5. Pulmonary emphysema, unspecified emphysema  type (HCC)  6. GAD (generalized anxiety disorder) stress management - ALPRAZolam (XANAX) 0.5 MG tablet; Take 1 tablet (0.5 mg total) by mouth daily.  Dispense: 30 tablet; Refill: 5  7. PANIC DISORDER  8. Osteopenia of lumbar spine Will discuss dexascan results - DG [facility] DEXA  9. BMI 29.0-29.9,adult Discussed diet and exercise for person with BMI >25 Will recheck weight in 3-6 months   10. EMPHYSEMA - Ipratropium-Albuterol (COMBIVENT) 20-100 MCG/ACT AERS respimat; Inhale 1 puff into the lungs every 6 (six) hours.  Dispense: 12 g; Refill: 1   Labs pending Health Maintenance reviewed Diet and exercise encouraged  Follow up plan: 6 months   Mary-Margaret Daphine Deutscher, FNP

## 2022-10-16 ENCOUNTER — Ambulatory Visit (INDEPENDENT_AMBULATORY_CARE_PROVIDER_SITE_OTHER): Payer: PPO

## 2022-10-16 DIAGNOSIS — Z23 Encounter for immunization: Secondary | ICD-10-CM

## 2022-11-03 ENCOUNTER — Other Ambulatory Visit (HOSPITAL_COMMUNITY): Payer: Self-pay | Admitting: Nurse Practitioner

## 2022-11-03 DIAGNOSIS — Z1231 Encounter for screening mammogram for malignant neoplasm of breast: Secondary | ICD-10-CM

## 2022-12-06 ENCOUNTER — Ambulatory Visit (HOSPITAL_COMMUNITY)
Admission: RE | Admit: 2022-12-06 | Discharge: 2022-12-06 | Disposition: A | Discharge status: Home / Self Care | Payer: PPO | Source: Ambulatory Visit | Attending: Nurse Practitioner | Admitting: Nurse Practitioner

## 2022-12-06 DIAGNOSIS — Z1231 Encounter for screening mammogram for malignant neoplasm of breast: Secondary | ICD-10-CM | POA: Diagnosis present

## 2023-01-12 ENCOUNTER — Ambulatory Visit (INDEPENDENT_AMBULATORY_CARE_PROVIDER_SITE_OTHER): Payer: PPO | Admitting: Nurse Practitioner

## 2023-01-12 ENCOUNTER — Encounter: Payer: Self-pay | Admitting: Nurse Practitioner

## 2023-01-12 VITALS — BP 110/44 | HR 67 | Temp 97.2°F | Ht 64.0 in | Wt 181.0 lb

## 2023-01-12 DIAGNOSIS — K219 Gastro-esophageal reflux disease without esophagitis: Secondary | ICD-10-CM | POA: Diagnosis not present

## 2023-01-12 DIAGNOSIS — K5790 Diverticulosis of intestine, part unspecified, without perforation or abscess without bleeding: Secondary | ICD-10-CM

## 2023-01-12 DIAGNOSIS — F411 Generalized anxiety disorder: Secondary | ICD-10-CM

## 2023-01-12 DIAGNOSIS — Z6829 Body mass index (BMI) 29.0-29.9, adult: Secondary | ICD-10-CM | POA: Diagnosis not present

## 2023-01-12 DIAGNOSIS — E785 Hyperlipidemia, unspecified: Secondary | ICD-10-CM

## 2023-01-12 DIAGNOSIS — J438 Other emphysema: Secondary | ICD-10-CM

## 2023-01-12 DIAGNOSIS — J439 Emphysema, unspecified: Secondary | ICD-10-CM

## 2023-01-12 DIAGNOSIS — I10 Essential (primary) hypertension: Secondary | ICD-10-CM | POA: Diagnosis not present

## 2023-01-12 DIAGNOSIS — M8588 Other specified disorders of bone density and structure, other site: Secondary | ICD-10-CM

## 2023-01-12 LAB — LIPID PANEL

## 2023-01-12 MED ORDER — ALPRAZOLAM 0.5 MG PO TABS
0.5000 mg | ORAL_TABLET | Freq: Every day | ORAL | 5 refills | Status: DC
Start: 2023-01-12 — End: 2023-07-13

## 2023-01-12 MED ORDER — OMEPRAZOLE 20 MG PO CPDR
20.0000 mg | DELAYED_RELEASE_CAPSULE | Freq: Every day | ORAL | 1 refills | Status: DC
Start: 2023-01-12 — End: 2023-07-13

## 2023-01-12 MED ORDER — LISINOPRIL-HYDROCHLOROTHIAZIDE 20-12.5 MG PO TABS
2.0000 | ORAL_TABLET | Freq: Every day | ORAL | 1 refills | Status: DC
Start: 2023-01-12 — End: 2023-07-13

## 2023-01-12 MED ORDER — AMLODIPINE BESYLATE 5 MG PO TABS
5.0000 mg | ORAL_TABLET | Freq: Every day | ORAL | 1 refills | Status: DC
Start: 2023-01-12 — End: 2023-07-13

## 2023-01-12 MED ORDER — IPRATROPIUM-ALBUTEROL 20-100 MCG/ACT IN AERS: 1.0000 | INHALATION_SPRAY | Freq: Four times a day (QID) | RESPIRATORY_TRACT | 1 refills | Status: AC

## 2023-01-12 NOTE — Progress Notes (Signed)
Subjective:    Patient ID: Gabriela Wilson, female    DOB: April 01, 1945, 77 y.o.   MRN: [id number]   Chief Complaint: medical management of chronic issues     HPI:  Gabriela Wilson is a 77 y.o. who identifies as a female who was assigned female at birth.   Social history: Lives with: husband Work history: retired   Water engineer in today for follow up of the following chronic medical issues:  1. Essential hypertension, benign No c/o chest pain, sob or headache. Does  check blood pressure at home and they are running in 120's systolic on average. BP Readings from Last 3 Encounters:  07/13/22 134/60  04/04/22 (!) 149/75  01/09/22 (!) 149/76     2. Hyperlipidemia with target LDL less than 100 Does try to watch tries to exercise in her pool daily Lab Results  Component Value Date   CHOL 194 07/13/2022   HDL 53 07/13/2022   LDLCALC 118 (H) 07/13/2022   TRIG 131 07/13/2022   CHOLHDL 3.7 07/13/2022     3. Diverticulosis Is currently having a flare up- has been eating watermelon and she thinks the white seeds have caused flare up.  4. Gastroesophageal reflux disease, unspecified whether esophagitis present Is on omeprazole daily  5. Pulmonary emphysema, unspecified emphysema type (HCC) Denies any breathing problems. Has not seen pulmonology in awhile. Does use advair daily  6. GAD (generalized anxiety disorder) 7. PANIC DISORDER Is on xanax and takes 1x daily    07/13/2022    9:04 AM 04/04/2022    8:58 AM 01/09/2022    9:22 AM 07/07/2021    8:34 AM  GAD 7 : Generalized Anxiety Score  Nervous, Anxious, on Edge 0 0 0 0  Control/stop worrying 0 0 0 0  Worry too much - different things 0 0 0 0  Trouble relaxing 0 0 0 0  Restless 0 0 0 0  Easily annoyed or irritable 0 0 0 0  Afraid - awful might happen 0 0 0 0  Total GAD 7 Score 0 0 0 0  Anxiety Difficulty Not difficult at all Not difficult at all Not difficult at all Not difficult at all      07/13/2022    9:03 AM  06/08/2022   11:09 AM 04/04/2022    8:58 AM  Depression screen PHQ 2/9  Decreased Interest 0 0 0  Down, Depressed, Hopeless 0 0 0  PHQ - 2 Score 0 0 0  Altered sleeping 0  0  Tired, decreased energy 0  0  Change in appetite 0  0  Feeling bad or failure about yourself  0  0  Trouble concentrating 0  0  Moving slowly or fidgety/restless 0  0  Suicidal thoughts 0  0  PHQ-9 Score 0  0  Difficult doing work/chores Not difficult at all  Not difficult at all      8. Osteopenia of lumbar spine Is due to have dexascan repeated today  9. BMI 29.0-29.9,adult Weight is up 4 lbs  Wt Readings from Last 3 Encounters:  01/12/23 181 lb (82.1 kg)  07/13/22 177 lb (80.3 kg)  06/08/22 176 lb (79.8 kg)   BMI Readings from Last 3 Encounters:  01/12/23 31.07 kg/m  07/13/22 30.38 kg/m  06/08/22 30.21 kg/m       New complaints: None today  No Known Allergies Outpatient Encounter Medications as of 01/12/2023  Medication Sig   albuterol (PROAIR HFA) 108 (90 Base) MCG/ACT inhaler  Inhale 2 puffs into the lungs every 6 (six) hours as needed.   ALPRAZolam (XANAX) 0.5 MG tablet Take 1 tablet (0.5 mg total) by mouth daily.   amLODipine (NORVASC) 5 MG tablet Take 1 tablet (5 mg total) by mouth daily.   ciprofloxacin (CIPRO) 500 MG tablet Take 1 tablet (500 mg total) by mouth 2 (two) times daily.   fluticasone-salmeterol (ADVAIR DISKUS) 100-50 MCG/ACT AEPB INHALE 1 DOSE BY MOUTH TWICE DAILY   Ipratropium-Albuterol (COMBIVENT) 20-100 MCG/ACT AERS respimat Inhale 1 puff into the lungs every 6 (six) hours.   lisinopril-hydrochlorothiazide (ZESTORETIC) 20-12.5 MG tablet Take 2 tablets by mouth daily.   metroNIDAZOLE (FLAGYL) 500 MG tablet Take 1 tablet (500 mg total) by mouth 2 (two) times daily.   Multiple Vitamin (MULTIVITAMIN WITH MINERALS) TABS tablet Take 1 tablet by mouth daily.   omeprazole (PRILOSEC) 20 MG capsule Take 1 capsule (20 mg total) by mouth daily.   No facility-administered  encounter medications on file as of 01/12/2023.    Past Surgical History:  Procedure Laterality Date   TUBAL LIGATION      Family History  Problem Relation Age of Onset   Asthma Mother    Hypertension Mother    Dementia Mother    Diabetes Father    COPD Father    Diabetes Sister    Hypertension Sister    Diabetes Brother    Early death Brother        MVA with quadrapelgic   Diabetes Brother    Dementia Brother    Diabetes Brother    Cancer Sister        vulva   Colon cancer Neg Hx       Controlled substance contract: n/a     Review of Systems  Constitutional:  Negative for diaphoresis.  Eyes:  Negative for pain.  Respiratory:  Positive for wheezing. Negative for shortness of breath.   Cardiovascular:  Negative for chest pain, palpitations and leg swelling.  Gastrointestinal:  Negative for abdominal pain.  Endocrine: Negative for polydipsia.  Skin:  Negative for rash.  Neurological:  Negative for dizziness, weakness and headaches.  Hematological:  Does not bruise/bleed easily.  All other systems reviewed and are negative.      Objective:   Physical Exam Vitals and nursing note reviewed.  Constitutional:      General: She is not in acute distress.    Appearance: Normal appearance. She is well-developed.  HENT:     Head: Normocephalic.     Right Ear: Tympanic membrane normal.     Left Ear: Tympanic membrane normal.     Nose: Nose normal.     Mouth/Throat:     Mouth: Mucous membranes are moist.  Eyes:     Pupils: Pupils are equal, round, and reactive to light.  Neck:     Vascular: No carotid bruit or JVD.  Cardiovascular:     Rate and Rhythm: Normal rate and regular rhythm.     Heart sounds: Normal heart sounds.  Pulmonary:     Effort: Pulmonary effort is normal. No respiratory distress.     Breath sounds: Normal breath sounds. No wheezing or rales.  Chest:     Chest wall: No tenderness.  Abdominal:     General: Bowel sounds are normal. There is  no distension or abdominal bruit.     Palpations: Abdomen is soft. There is no hepatomegaly, splenomegaly, mass or pulsatile mass.     Tenderness: There is no abdominal tenderness.  Musculoskeletal:  General: Normal range of motion.     Cervical back: Normal range of motion and neck supple.  Lymphadenopathy:     Cervical: No cervical adenopathy.  Skin:    General: Skin is warm and dry.  Neurological:     Mental Status: She is alert and oriented to person, place, and time.     Deep Tendon Reflexes: Reflexes are normal and symmetric.  Psychiatric:        Behavior: Behavior normal.        Thought Content: Thought content normal.        Judgment: Judgment normal.    BP (!) 110/44   Pulse 67   Temp (!) 97.2 F (36.2 C) (Temporal)   Ht 5\' 4"  (1.626 m)   Wt 181 lb (82.1 kg)   SpO2 99%   BMI 31.07 kg/m          Assessment & Plan:  XITLLALI CREELY comes in today with chief complaint of Medical Management of Chronic Issues   Diagnosis and orders addressed:  1. Essential hypertension, benign (Primary) Low sodium diet - amLODipine (NORVASC) 5 MG tablet; Take 1 tablet (5 mg total) by mouth daily.  Dispense: 90 tablet; Refill: 1 - lisinopril-hydrochlorothiazide (ZESTORETIC) 20-12.5 MG tablet; Take 2 tablets by mouth daily.  Dispense: 180 tablet; Refill: 1 - CBC with Differential/Platelet - CMP14+EGFR  2. Hyperlipidemia with target LDL less than 100 Low fat diet - Lipid panel  3. Pulmonary emphysema, unspecified emphysema type (HCC)  4. Gastroesophageal reflux disease, unspecified whether esophagitis present Avoid spicy foods Do not eat 2 hours prior to bedtime  - omeprazole (PRILOSEC) 20 MG capsule; Take 1 capsule (20 mg total) by mouth daily.  Dispense: 90 capsule; Refill: 1  5. Diverticulosis Watch diet to prevent flare up  6. GAD (generalized anxiety disorder) Stress management - ToxASSURE Select 13 (MW), Urine - ALPRAZolam (XANAX) 0.5 MG tablet; Take 1  tablet (0.5 mg total) by mouth daily.  Dispense: 30 tablet; Refill: 5  7. Osteopenia of lumbar spine Weight bearing exercise  8. BMI 29.0-29.9,adult Discussed diet and exercise for person with BMI >25 Will recheck weight in 3-6 months   9. EMPHYSEMA - Ipratropium-Albuterol (COMBIVENT) 20-100 MCG/ACT AERS respimat; Inhale 1 puff into the lungs every 6 (six) hours.  Dispense: 12 g; Refill: 1   Labs pending Health Maintenance reviewed Diet and exercise encouraged  Follow up plan: 6 months   Mary-Margaret Daphine Deutscher, FNP

## 2023-01-13 LAB — CBC WITH DIFFERENTIAL/PLATELET
Basophils Absolute: 0 10*3/uL (ref 0.0–0.2)
Basos: 1 %
EOS (ABSOLUTE): 0.3 10*3/uL (ref 0.0–0.4)
Eos: 5 %
Hematocrit: 40.6 % (ref 34.0–46.6)
Hemoglobin: 13.6 g/dL (ref 11.1–15.9)
Immature Grans (Abs): 0 10*3/uL (ref 0.0–0.1)
Immature Granulocytes: 0 %
Lymphocytes Absolute: 1.5 10*3/uL (ref 0.7–3.1)
Lymphs: 26 %
MCH: 31.1 pg (ref 26.6–33.0)
MCHC: 33.5 g/dL (ref 31.5–35.7)
MCV: 93 fL (ref 79–97)
Monocytes Absolute: 0.5 10*3/uL (ref 0.1–0.9)
Monocytes: 8 %
Neutrophils Absolute: 3.6 10*3/uL (ref 1.4–7.0)
Neutrophils: 60 %
Platelets: 244 10*3/uL (ref 150–450)
RBC: 4.38 x10E6/uL (ref 3.77–5.28)
RDW: 12.4 % (ref 11.7–15.4)
WBC: 5.9 10*3/uL (ref 3.4–10.8)

## 2023-01-13 LAB — LIPID PANEL
Cholesterol, Total: 192 mg/dL (ref 100–199)
HDL: 50 mg/dL (ref >39)
LDL CALC COMMENT:: 3.8 ratio (ref 0.0–4.4)
LDL Chol Calc (NIH): 122 mg/dL — ABNORMAL HIGH (ref 0–99)
Triglycerides: 110 mg/dL (ref 0–149)
VLDL Cholesterol Cal: 20 mg/dL (ref 5–40)

## 2023-01-13 LAB — CMP14+EGFR
ALT: 27 IU/L (ref 0–32)
AST: 29 IU/L (ref 0–40)
Albumin: 4.7 g/dL (ref 3.8–4.8)
Alkaline Phosphatase: 52 IU/L (ref 44–121)
BUN/Creatinine Ratio: 18 (ref 12–28)
BUN: 20 mg/dL (ref 8–27)
Bilirubin Total: 0.8 mg/dL (ref 0.0–1.2)
CO2: 23 mmol/L (ref 20–29)
Calcium: 9.9 mg/dL (ref 8.7–10.3)
Chloride: 99 mmol/L (ref 96–106)
Creatinine, Ser: 1.14 mg/dL — ABNORMAL HIGH (ref 0.57–1.00)
Globulin, Total: 2.2 g/dL (ref 1.5–4.5)
Glucose: 103 mg/dL — ABNORMAL HIGH (ref 70–99)
Potassium: 3.8 mmol/L (ref 3.5–5.2)
Sodium: 140 mmol/L (ref 134–144)
Total Protein: 6.9 g/dL (ref 6.0–8.5)
eGFR: 50 mL/min/{1.73_m2} — ABNORMAL LOW (ref >59)

## 2023-01-17 LAB — TOXASSURE SELECT 13 (MW), URINE

## 2023-06-11 ENCOUNTER — Ambulatory Visit: Payer: PPO

## 2023-06-11 VITALS — BP 110/44 | HR 67 | Ht 64.0 in | Wt 181.0 lb

## 2023-06-11 DIAGNOSIS — Z0000 Encounter for general adult medical examination without abnormal findings: Secondary | ICD-10-CM

## 2023-06-11 NOTE — Patient Instructions (Signed)
 Gabriela Wilson , Thank you for taking time out of your busy schedule to complete your Annual Wellness Visit with me. I enjoyed our conversation and look forward to speaking with you again next year. I, as well as your care team,  appreciate your ongoing commitment to your health goals. Please review the following plan we discussed and let me know if I can assist you in the future. Your Game plan/ To Do List    Follow up Visits: Next Medicare AWV with our clinical staff: Wed., 06/11/24 at 10:40a.m.   Next Office Visit with your provider: 07/13/23 at 9:30a.m.  Clinician Recommendations:  Aim for 30 minutes of exercise or brisk walking, 6-8 glasses of water, and 5 servings of fruits and vegetables each day. Please discuss with provider about getting the Shingles vaccine done at your next visit. If you have any questions please contact our office at [phone].      This is a list of the screening recommended for you and due dates:  Health Maintenance  Topic Date Due   Zoster (Shingles) Vaccine (1 of 2) Never done   Pneumonia Vaccine (1 of 2 - PCV) 07/13/2023*   Colon Cancer Screening  07/13/2023*   DTaP/Tdap/Td vaccine (1 - Tdap) 01/12/2024*   COVID-19 Vaccine (3 - Moderna risk series) 06/26/2024*   Flu Shot  08/24/2023   Mammogram  12/06/2023   Medicare Annual Wellness Visit  06/10/2024   Hepatitis C Screening  Completed   HPV Vaccine  Aged Out   Meningitis B Vaccine  Aged Out   DEXA scan (bone density measurement)  Discontinued  *Topic was postponed. The date shown is not the original due date.    Advanced directives: (Declined) Advance directive discussed with you today. Even though you declined this today, please call our office should you change your mind, and we can give you the proper paperwork for you to fill out. Advance Care Planning is important because it:  [x]  Makes sure you receive the medical care that is consistent with your values, goals, and preferences  [x]  It provides  guidance to your family and loved ones and reduces their decisional burden about whether or not they are making the right decisions based on your wishes.  Follow the link provided in your after visit summary or read over the paperwork we have mailed to you to help you started getting your Advance Directives in place. If you need assistance in completing these, please reach out to us  so that we can help you!  See attachments for Preventive Care and Fall Prevention Tips.

## 2023-06-11 NOTE — Progress Notes (Signed)
 Subjective:   Gabriela Wilson is a 78 y.o. who presents for a Medicare Wellness preventive visit.  As a reminder, Annual Wellness Visits don't include a physical exam, and some assessments may be limited, especially if this visit is performed virtually. We may recommend an in-person follow-up visit with your provider if needed.  Visit Complete: Virtual I connected with  [name] on 06/11/23 by a audio enabled telemedicine application and verified that I am speaking with the correct person using two identifiers.  Patient Location: Home  Provider Location: Home Office  I discussed the limitations of evaluation and management by telemedicine. The patient expressed understanding and agreed to proceed.  Vital Signs: Because this visit was a virtual/telehealth visit, some criteria may be missing or patient reported. Any vitals not documented were not able to be obtained and vitals that have been documented are patient reported.  VideoDeclined- This patient declined Librarian, academic. Therefore the visit was completed with audio only.  Persons Participating in Visit: Patient.  AWV Questionnaire: No: Patient Medicare AWV questionnaire was not completed prior to this visit.  Cardiac Risk Factors include: advanced age (>48men, >50 women);obesity (BMI >30kg/m2);dyslipidemia     Objective:     Today's Vitals   06/11/23 1013  BP: (!) 110/44  Pulse: 67  Weight: 181 lb (82.1 kg)  Height: 5\' 4"  (1.626 m)   Body mass index is 31.07 kg/m.     06/11/2023   10:22 AM 06/08/2022   11:09 AM 07/01/2021   12:09 PM 06/30/2020   11:17 AM 05/02/2017    9:18 AM 07/31/2014    8:41 AM 06/10/2014   11:11 AM  Advanced Directives  Does Patient Have a Medical Advance Directive? No No Yes No No No No  Type of Surveyor, minerals;Living will      Copy of Healthcare Power of Attorney in Chart?   No - copy requested      Would patient like  information on creating a medical advance directive?  No - Patient declined  No - Patient declined Yes (ED - Information included in AVS) Yes - Educational materials given Yes - Transport planner given;No - patient declined information    Current Medications (verified) Outpatient Encounter Medications as of 06/11/2023  Medication Sig   albuterol  (PROAIR  HFA) 108 (90 Base) MCG/ACT inhaler Inhale 2 puffs into the lungs every 6 (six) hours as needed.   ALPRAZolam  (XANAX ) 0.5 MG tablet Take 1 tablet (0.5 mg total) by mouth daily.   amLODipine  (NORVASC ) 5 MG tablet Take 1 tablet (5 mg total) by mouth daily.   fluticasone -salmeterol (ADVAIR  DISKUS) 100-50 MCG/ACT AEPB INHALE 1 DOSE BY MOUTH TWICE DAILY   Ipratropium-Albuterol  (COMBIVENT) 20-100 MCG/ACT AERS respimat Inhale 1 puff into the lungs every 6 (six) hours.   lisinopril -hydrochlorothiazide  (ZESTORETIC ) 20-12.5 MG tablet Take 2 tablets by mouth daily.   Multiple Vitamin (MULTIVITAMIN WITH MINERALS) TABS tablet Take 1 tablet by mouth daily.   omeprazole  (PRILOSEC) 20 MG capsule Take 1 capsule (20 mg total) by mouth daily.   No facility-administered encounter medications on file as of 06/11/2023.    Allergies (verified) Patient has no known allergies.   History: Past Medical History:  Diagnosis Date   Asthma    COPD (chronic obstructive pulmonary disease) (HCC)    Hyperlipidemia    Hypertension    Osteopenia    Past Surgical History:  Procedure Laterality Date   TUBAL LIGATION  Family History  Problem Relation Age of Onset   Asthma Mother    Hypertension Mother    Dementia Mother    Diabetes Father    COPD Father    Diabetes Sister    Hypertension Sister    Diabetes Brother    Early death Brother        MVA with quadrapelgic   Diabetes Brother    Dementia Brother    Diabetes Brother    Cancer Sister        vulva   Colon cancer Neg Hx    Social History   Socioeconomic History   Marital status: Married     Spouse name: Not on file   Number of children: 3   Years of education: Not on file   Highest education level: 11th grade  Occupational History   Occupation: retired    Associate Professor: Engineer, materials    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  Tobacco Use   Smoking status: Former    Current packs/day: 0.00    Types: Cigarettes    Quit date: 01/24/1992    Years since quitting: 31.4   Smokeless tobacco: Never  Vaping Use   Vaping status: Never Used  Substance and Sexual Activity   Alcohol use: Yes    Comment: wine about every 2 weeks   Drug use: No   Sexual activity: Yes  Other Topics Concern   Not on file  Social History Narrative   Very involved in church   Children live in [place], [place], and [place]   Social Drivers of Health   Financial Resource Strain: Low Risk  (06/11/2023)   Overall Financial Resource Strain (CARDIA)    Difficulty of Paying Living Expenses: Not hard at all  Food Insecurity: No Food Insecurity (06/11/2023)   Hunger Vital Sign    Worried About Running Out of Food in the Last Year: Never true    Ran Out of Food in the Last Year: Never true  Transportation Needs: No Transportation Needs (06/11/2023)   PRAPARE - Administrator, Civil Service (Medical): No    Lack of Transportation (Non-Medical): No  Physical Activity: Sufficiently Active (06/11/2023)   Exercise Vital Sign    Days of Exercise per Week: 7 days    Minutes of Exercise per Session: 30 min  Stress: No Stress Concern Present (06/11/2023)   [employer] of Occupational Health - Occupational Stress Questionnaire    Feeling of Stress : Not at all  Social Connections: Moderately Integrated (06/11/2023)   Social Connection and Isolation Panel [NHANES]    Frequency of Communication with Friends and Family: More than three times a week    Frequency of Social Gatherings with Friends and Family: More than three times a week    Attends Religious Services: More than 4 times per year    Active Member of [employer]  or Organizations: No    Attends Engineer, structural: Never    Marital Status: Married    Tobacco Counseling Counseling given: Not Answered    Clinical Intake:  Pre-visit preparation completed: Yes  Pain : No/denies pain     BMI - recorded: 31.07 Nutritional Status: BMI > 30  Obese Nutritional Risks: None Diabetes: No  No results found for: "HGBA1C"   How often do you need to have someone help you when you read instructions, pamphlets, or other written materials from your doctor or pharmacy?: 1 - Never  Interpreter Needed?: No  Information entered by :: Anola Basques T/cma  Activities of Daily Living     06/11/2023   10:20 AM  In your present state of health, do you have any difficulty performing the following activities:  Hearing? 0  Vision? 0  Difficulty concentrating or making decisions? 0  Walking or climbing stairs? 0  Dressing or bathing? 0  Doing errands, shopping? 0  Preparing Food and eating ? N  Using the Toilet? N  In the past six months, have you accidently leaked urine? N  Do you have problems with loss of bowel control? N  Managing your Medications? N  Managing your Finances? N  Housekeeping or managing your Housekeeping? N    Patient Care Team: Delfina Feller, FNP as PCP - General (Nurse Practitioner) Devon Fogo, MD (Inactive) as Consulting Physician (Dermatology)  Indicate any recent Medical Services you may have received from other than [facility] providers in the past year (date may be approximate).     Assessment:    This is a routine wellness examination for Laylee.  Hearing/Vision screen Hearing Screening - Comments:: Pt denies hearing dif Vision Screening - Comments:: pt wear glasses/pt [facility] to [facility]   Goals Addressed             This Visit's Progress    Exercise 150 minutes per week (moderate activity)   On track      Depression Screen     06/11/2023   10:25 AM 01/12/2023    9:26 AM 07/13/2022     9:03 AM 06/08/2022   11:09 AM 04/04/2022    8:58 AM 01/09/2022    9:22 AM 09/28/2021    2:02 PM  PHQ 2/9 Scores  PHQ - 2 Score 0 0 0 0 0 0 0  PHQ- 9 Score 0  0  0 0     Fall Risk     01/12/2023    9:26 AM 07/13/2022    9:03 AM 06/08/2022   11:08 AM 04/04/2022    8:58 AM 01/09/2022    9:21 AM  Fall Risk   Falls in the past year? 0 0 0 0 0  Number falls in past yr:   0    Injury with Fall?   0    Risk for fall due to :   No Fall Risks    Follow up   Falls prevention discussed      MEDICARE RISK AT HOME:  Medicare Risk at Home Any stairs in or around the home?: Yes If so, are there any without handrails?: Yes Home free of loose throw rugs in walkways, pet beds, electrical cords, etc?: Yes Adequate lighting in your home to reduce risk of falls?: Yes Life alert?: No Use of a cane, walker or w/c?: No Grab bars in the bathroom?: Yes Shower chair or bench in shower?: No Elevated toilet seat or a handicapped toilet?: Yes  TIMED UP AND GO:  Was the test performed?  no  Cognitive Function: 6CIT completed    05/02/2017    9:20 AM 06/10/2014   11:14 AM  MMSE - Mini Mental State Exam  Orientation to time 5 5  Orientation to Place 5 5  Registration 3 3  Attention/ Calculation 5 4  Recall 3 3  Language- name 2 objects 2 2  Language- repeat 1 1  Language- follow 3 step command 3 3  Language- read & follow direction 1 1  Write a sentence 1 1  Copy design 1 1  Total score 30 29  06/11/2023   10:27 AM 06/08/2022   11:10 AM 07/01/2021   12:10 PM 06/30/2020   11:18 AM  6CIT Screen  What Year? 0 points 0 points 0 points 0 points  What month? 0 points 0 points 0 points 0 points  What time? 0 points 0 points 0 points 0 points  Count back from 20 0 points 0 points 0 points 0 points  Months in reverse 0 points 0 points 2 points 0 points  Repeat phrase 0 points 0 points 0 points 0 points  Total Score 0 points 0 points 2 points 0 points    Immunizations Immunization History   Administered Date(s) Administered   Fluad Quad(high Dose 65+) 11/22/2018, 10/13/2019, 11/02/2020, 01/09/2022   Fluad Trivalent(High Dose 65+) 10/16/2022   Influenza, High Dose Seasonal PF 10/20/2015, 11/14/2016, 12/17/2017   Influenza,inj,Quad PF,6+ Mos 12/13/2012, 11/18/2013, 11/24/2014   Moderna Sars-Covid-2 Vaccination 03/20/2019, 04/18/2019    Screening Tests Health Maintenance  Topic Date Due   Zoster Vaccines- Shingrix (1 of 2) Never done   Pneumonia Vaccine 40+ Years old (1 of 2 - PCV) 07/13/2023 (Originally 05/31/1964)   Colonoscopy  07/13/2023 (Originally 07/13/2022)   DTaP/Tdap/Td (1 - Tdap) 01/12/2024 (Originally 05/31/1964)   COVID-19 Vaccine (3 - Moderna risk series) 06/26/2024 (Originally 05/16/2019)   INFLUENZA VACCINE  08/24/2023   MAMMOGRAM  12/06/2023   Medicare Annual Wellness (AWV)  06/10/2024   Hepatitis C Screening  Completed   HPV VACCINES  Aged Out   Meningococcal B Vaccine  Aged Out   DEXA SCAN  Discontinued    Health Maintenance  Health Maintenance Due  Topic Date Due   Zoster Vaccines- Shingrix (1 of 2) Never done   Health Maintenance Items Addressed: See Nurse Notes  Additional Screening:  Vision Screening: Recommended annual ophthalmology exams for early detection of glaucoma and other disorders of the eye.  Dental Screening: Recommended annual dental exams for proper oral hygiene  Community Resource Referral / Chronic Care Management: CRR required this visit?  No   CCM required this visit?  No   Plan:    I have personally reviewed and noted the following in the patient's chart:   Medical and social history Use of alcohol, tobacco or illicit drugs  Current medications and supplements including opioid prescriptions. Patient is not currently taking opioid prescriptions. Functional ability and status Nutritional status Physical activity Advanced directives List of other physicians Hospitalizations, surgeries, and ER visits in previous  12 months Vitals Screenings to include cognitive, depression, and falls Referrals and appointments  In addition, I have reviewed and discussed with patient certain preventive protocols, quality metrics, and best practice recommendations. A written personalized care plan for preventive services as well as general preventive health recommendations were provided to patient.   Michaelle Adolphus, CMA   06/11/2023   After Visit Summary: (MyChart) Due to this being a telephonic visit, the after visit summary with patients personalized plan was offered to patient via MyChart   Notes: pt is aware and encourage to get Shingles vaccines at next pcp visit.

## 2023-07-13 ENCOUNTER — Encounter: Payer: Self-pay | Admitting: Nurse Practitioner

## 2023-07-13 ENCOUNTER — Ambulatory Visit: Payer: PPO | Admitting: Nurse Practitioner

## 2023-07-13 VITALS — BP 130/62 | HR 66 | Temp 97.1°F | Ht 64.0 in | Wt 176.0 lb

## 2023-07-13 DIAGNOSIS — Z0001 Encounter for general adult medical examination with abnormal findings: Secondary | ICD-10-CM

## 2023-07-13 DIAGNOSIS — F411 Generalized anxiety disorder: Secondary | ICD-10-CM

## 2023-07-13 DIAGNOSIS — Z6829 Body mass index (BMI) 29.0-29.9, adult: Secondary | ICD-10-CM | POA: Diagnosis not present

## 2023-07-13 DIAGNOSIS — I10 Essential (primary) hypertension: Secondary | ICD-10-CM | POA: Diagnosis not present

## 2023-07-13 DIAGNOSIS — J439 Emphysema, unspecified: Secondary | ICD-10-CM

## 2023-07-13 DIAGNOSIS — M8588 Other specified disorders of bone density and structure, other site: Secondary | ICD-10-CM

## 2023-07-13 DIAGNOSIS — F410 Panic disorder [episodic paroxysmal anxiety] without agoraphobia: Secondary | ICD-10-CM | POA: Diagnosis not present

## 2023-07-13 DIAGNOSIS — K219 Gastro-esophageal reflux disease without esophagitis: Secondary | ICD-10-CM

## 2023-07-13 DIAGNOSIS — E785 Hyperlipidemia, unspecified: Secondary | ICD-10-CM

## 2023-07-13 DIAGNOSIS — K5790 Diverticulosis of intestine, part unspecified, without perforation or abscess without bleeding: Secondary | ICD-10-CM | POA: Diagnosis not present

## 2023-07-13 DIAGNOSIS — Z0000 Encounter for general adult medical examination without abnormal findings: Secondary | ICD-10-CM

## 2023-07-13 MED ORDER — AMLODIPINE BESYLATE 5 MG PO TABS: 5.0000 mg | ORAL_TABLET | Freq: Every day | ORAL | 1 refills | Status: DC

## 2023-07-13 MED ORDER — LISINOPRIL-HYDROCHLOROTHIAZIDE 20-12.5 MG PO TABS
2.0000 | ORAL_TABLET | Freq: Every day | ORAL | 1 refills | Status: AC
Start: 2023-07-13 — End: ?

## 2023-07-13 MED ORDER — FLUTICASONE-SALMETEROL 100-50 MCG/ACT IN AEPB: INHALATION_SPRAY | RESPIRATORY_TRACT | 1 refills | Status: DC

## 2023-07-13 MED ORDER — ALBUTEROL SULFATE HFA 108 (90 BASE) MCG/ACT IN AERS: 2.0000 | INHALATION_SPRAY | Freq: Four times a day (QID) | RESPIRATORY_TRACT | 1 refills | Status: AC | PRN

## 2023-07-13 MED ORDER — METRONIDAZOLE 500 MG PO TABS
500.0000 mg | ORAL_TABLET | Freq: Three times a day (TID) | ORAL | 0 refills | Status: AC
Start: 2023-07-13 — End: 2023-07-20

## 2023-07-13 MED ORDER — CIPROFLOXACIN HCL 500 MG PO TABS: 500.0000 mg | ORAL_TABLET | Freq: Two times a day (BID) | ORAL | 0 refills | Status: AC

## 2023-07-13 MED ORDER — ALPRAZOLAM 0.5 MG PO TABS: 0.5000 mg | ORAL_TABLET | Freq: Every day | ORAL | 5 refills | Status: AC

## 2023-07-13 MED ORDER — OMEPRAZOLE 20 MG PO CPDR: 20.0000 mg | DELAYED_RELEASE_CAPSULE | Freq: Every day | ORAL | 1 refills | Status: AC

## 2023-07-13 NOTE — Progress Notes (Signed)
 Subjective:    Patient ID: Gabriela Wilson, female    DOB: 04-22-1945, 78 y.o.   MRN: [id number]   Chief Complaint: annual physical     HPI:  Gabriela Wilson is a 78 y.o. who identifies as a female who was assigned female at birth.   Social history: Lives with: husband Work history: retired   Water engineer in today for follow up of the following chronic medical issues:  1. Essential hypertension, benign No c/o chest pain, sob or headache. Does  check blood pressure at home and they are running in 120's systolic on average. BP Readings from Last 3 Encounters:  06/11/23 (!) 110/44  01/12/23 (!) 110/44  07/13/22 134/60     2. Hyperlipidemia with target LDL less than 100 Does try to watch tries to exercise in her pool daily Lab Results  Component Value Date   CHOL 192 01/12/2023   HDL 50 01/12/2023   LDLCALC 122 (H) 01/12/2023   TRIG 110 01/12/2023   CHOLHDL 3.8 01/12/2023   The 10-year ASCVD risk score (Arnett DK, et al., 2019) is: 26.1%   3. Diverticulosis Is currently having a flare up- has been eating watermelon and she thinks the white seeds have caused flare up.  4. Gastroesophageal reflux disease, unspecified whether esophagitis present Is on omeprazole  daily  5. Pulmonary emphysema, unspecified emphysema type (HCC) Denies any breathing problems. Has not seen pulmonology in awhile. Does use advair  daily.  6. GAD (generalized anxiety disorder) 7. PANIC DISORDER Is on xanax  and takes 1x daily    07/13/2022    9:04 AM 04/04/2022    8:58 AM 01/09/2022    9:22 AM 07/07/2021    8:34 AM  GAD 7 : Generalized Anxiety Score  Nervous, Anxious, on Edge 0 0 0 0  Control/stop worrying 0 0 0 0  Worry too much - different things 0 0 0 0  Trouble relaxing 0 0 0 0  Restless 0 0 0 0  Easily annoyed or irritable 0 0 0 0  Afraid - awful might happen 0 0 0 0  Total GAD 7 Score 0 0 0 0  Anxiety Difficulty Not difficult at all Not difficult at all Not difficult at all Not  difficult at all      06/11/2023   10:25 AM 01/12/2023    9:26 AM 07/13/2022    9:03 AM  Depression screen PHQ 2/9  Decreased Interest 0 0 0  Down, Depressed, Hopeless 0 0 0  PHQ - 2 Score 0 0 0  Altered sleeping 0  0  Tired, decreased energy 0  0  Change in appetite 0  0  Feeling bad or failure about yourself  0  0  Trouble concentrating 0  0  Moving slowly or fidgety/restless 0  0  Suicidal thoughts 0  0  PHQ-9 Score 0  0  Difficult doing work/chores Not difficult at all  Not difficult at all      8. Osteopenia of lumbar spine Last dexascan was done on 07/13/22. T score was -1.1. does no dedicated weight bearing exercise.  9. BMI 29.0-29.9,adult Weight is up 4 lbs  Wt Readings from Last 3 Encounters:  06/11/23 181 lb (82.1 kg)  01/12/23 181 lb (82.1 kg)  07/13/22 177 lb (80.3 kg)   BMI Readings from Last 3 Encounters:  06/11/23 31.07 kg/m  01/12/23 31.07 kg/m  07/13/22 30.38 kg/m       New complaints: None today  No Known Allergies Outpatient  Encounter Medications as of 07/13/2023  Medication Sig   albuterol  (PROAIR  HFA) 108 (90 Base) MCG/ACT inhaler Inhale 2 puffs into the lungs every 6 (six) hours as needed.   ALPRAZolam  (XANAX ) 0.5 MG tablet Take 1 tablet (0.5 mg total) by mouth daily.   amLODipine  (NORVASC ) 5 MG tablet Take 1 tablet (5 mg total) by mouth daily.   fluticasone -salmeterol (ADVAIR  DISKUS) 100-50 MCG/ACT AEPB INHALE 1 DOSE BY MOUTH TWICE DAILY   Ipratropium-Albuterol  (COMBIVENT) 20-100 MCG/ACT AERS respimat Inhale 1 puff into the lungs every 6 (six) hours.   lisinopril -hydrochlorothiazide  (ZESTORETIC ) 20-12.5 MG tablet Take 2 tablets by mouth daily.   Multiple Vitamin (MULTIVITAMIN WITH MINERALS) TABS tablet Take 1 tablet by mouth daily.   omeprazole  (PRILOSEC) 20 MG capsule Take 1 capsule (20 mg total) by mouth daily.   No facility-administered encounter medications on file as of 07/13/2023.    Past Surgical History:  Procedure  Laterality Date   TUBAL LIGATION      Family History  Problem Relation Age of Onset   Asthma Mother    Hypertension Mother    Dementia Mother    Diabetes Father    COPD Father    Diabetes Sister    Hypertension Sister    Diabetes Brother    Early death Brother        MVA with quadrapelgic   Diabetes Brother    Dementia Brother    Diabetes Brother    Cancer Sister        vulva   Colon cancer Neg Hx       Controlled substance contract: n/a     Review of Systems  Constitutional:  Negative for diaphoresis.  Eyes:  Negative for pain.  Respiratory:  Positive for wheezing. Negative for shortness of breath.   Cardiovascular:  Negative for chest pain, palpitations and leg swelling.  Gastrointestinal:  Negative for abdominal pain.  Endocrine: Negative for polydipsia.  Skin:  Negative for rash.  Neurological:  Negative for dizziness, weakness and headaches.  Hematological:  Does not bruise/bleed easily.  All other systems reviewed and are negative.      Objective:   Physical Exam Vitals and nursing note reviewed.  Constitutional:      General: She is not in acute distress.    Appearance: Normal appearance. She is well-developed.  HENT:     Head: Normocephalic.     Right Ear: Tympanic membrane normal.     Left Ear: Tympanic membrane normal.     Nose: Nose normal.     Mouth/Throat:     Mouth: Mucous membranes are moist.   Eyes:     Pupils: Pupils are equal, round, and reactive to light.   Neck:     Vascular: No carotid bruit or JVD.   Cardiovascular:     Rate and Rhythm: Normal rate and regular rhythm.     Heart sounds: Normal heart sounds.  Pulmonary:     Effort: Pulmonary effort is normal. No respiratory distress.     Breath sounds: Normal breath sounds. No wheezing or rales.  Chest:     Chest wall: No tenderness.  Abdominal:     General: Bowel sounds are normal. There is no distension or abdominal bruit.     Palpations: Abdomen is soft. There is no  hepatomegaly, splenomegaly, mass or pulsatile mass.     Tenderness: There is no abdominal tenderness.   Musculoskeletal:        General: Normal range of motion.  Cervical back: Normal range of motion and neck supple.  Lymphadenopathy:     Cervical: No cervical adenopathy.   Skin:    General: Skin is warm and dry.   Neurological:     Mental Status: She is alert and oriented to person, place, and time.     Deep Tendon Reflexes: Reflexes are normal and symmetric.   Psychiatric:        Behavior: Behavior normal.        Thought Content: Thought content normal.        Judgment: Judgment normal.    BP 130/62   Pulse 66   Temp (!) 97.1 F (36.2 C) (Temporal)   Ht 5' 4 (1.626 m)   Wt 176 lb (79.8 kg)   BMI 30.21 kg/m        Assessment & Plan:  Gabriela Wilson comes in today with chief complaint of annual physical  Diagnosis and orders addressed:  1. Essential hypertension, benign (Primary) Low sodium diet - amLODipine  (NORVASC ) 5 MG tablet; Take 1 tablet (5 mg total) by mouth daily.  Dispense: 90 tablet; Refill: 1 - lisinopril -hydrochlorothiazide  (ZESTORETIC ) 20-12.5 MG tablet; Take 2 tablets by mouth daily.  Dispense: 180 tablet; Refill: 1 - CBC with Differential/Platelet - CMP14+EGFR  2. Hyperlipidemia with target LDL less than 100 Low fat diet - Lipid panel  3. Pulmonary emphysema, unspecified emphysema type (HCC)  4. Gastroesophageal reflux disease, unspecified whether esophagitis present Avoid spicy foods Do not eat 2 hours prior to bedtime  - omeprazole  (PRILOSEC) 20 MG capsule; Take 1 capsule (20 mg total) by mouth daily.  Dispense: 90 capsule; Refill: 1  5. Diverticulosis Watch diet to prevent flare up Cipro  500mg  1 po bid for 7 days #14 O refills FLagyl  500mg  1 po BIND for 7 days  #14 O refill  6. GAD (generalized anxiety disorder) Stress management - ToxASSURE Select 13 (MW), Urine - ALPRAZolam  (XANAX ) 0.5 MG tablet; Take 1 tablet (0.5 mg total)  by mouth daily.  Dispense: 30 tablet; Refill: 5  7. Osteopenia of lumbar spine Weight bearing exercise  8. BMI 29.0-29.9,adult Discussed diet and exercise for person with BMI >25 Will recheck weight in 3-6 months   Meds ordered this encounter  Medications   albuterol  (PROAIR  HFA) 108 (90 Base) MCG/ACT inhaler    Sig: Inhale 2 puffs into the lungs every 6 (six) hours as needed.    Dispense:  3 each    Refill:  1    Supervising Provider:   DETTINGER, JOSHUA A [[id number]]   fluticasone -salmeterol (ADVAIR  DISKUS) 100-50 MCG/ACT AEPB    Sig: INHALE 1 DOSE BY MOUTH TWICE DAILY    Dispense:  180 each    Refill:  1    Supervising Provider:   DETTINGER, JOSHUA A [[id number]]   ALPRAZolam  (XANAX ) 0.5 MG tablet    Sig: Take 1 tablet (0.5 mg total) by mouth daily.    Dispense:  30 tablet    Refill:  5    Supervising Provider:   DETTINGER, JOSHUA A [[id number]]   amLODipine  (NORVASC ) 5 MG tablet    Sig: Take 1 tablet (5 mg total) by mouth daily.    Dispense:  90 tablet    Refill:  1    Supervising Provider:   DETTINGER, JOSHUA A [[id number]]   lisinopril -hydrochlorothiazide  (ZESTORETIC ) 20-12.5 MG tablet    Sig: Take 2 tablets by mouth daily.    Dispense:  180 tablet    Refill:  1    Supervising  Provider:   Hilton Lucky [[id number]]   omeprazole  (PRILOSEC) 20 MG capsule    Sig: Take 1 capsule (20 mg total) by mouth daily.    Dispense:  90 capsule    Refill:  1    Supervising Provider:   Jolyne Needs A [id number]     Labs pending Health Maintenance reviewed Diet and exercise encouraged  Follow up plan: 6 months   Mary-Margaret Gaylyn Keas, FNP

## 2023-07-13 NOTE — Patient Instructions (Signed)
 Diverticulitis  Diverticulitis is when small pouches in your colon get infected or swollen. This causes pain in your belly (abdomen) and watery poop (diarrhea). The small pouches are called diverticula. They may form if you have a condition called diverticulosis. What are the causes? You may get this condition if poop (stool) gets trapped in the pouches in your colon. The poop lets germs (bacteria) grow. This causes an infection. What increases the risk? You are more likely to get this condition if you have small pouches in your colon. You are also more likely to get it if: You are overweight or very overweight (obese). You do not exercise enough. You drink alcohol. You smoke. You eat a lot of red meat, like beef, pork, or lamb. You do not eat enough fiber. You are older than 77 years of age. What are the signs or symptoms? Pain in your belly. Pain is often on the left side, but it may be felt in other spots too. Fever and chills. Feeling like you may vomit. Vomiting. Having cramps. Feeling full. Changes in how often you poop. Blood in your poop. How is this treated? Most cases are treated at home. You may be told to: Take over-the-counter pain medicines. Only eat and drink clear liquids. Take antibiotics. Rest. Very bad cases may need to be treated at a hospital. Treatment may include: Not eating or drinking. Taking pain medicines. Getting antibiotics through an IV tube. Getting fluid and food through an IV tube. Having surgery. When you are feeling better, you may need to have a test to look at your colon (colonoscopy). Follow these instructions at home: Medicines Take over-the-counter and prescription medicines only as told by your doctor. These include: Fiber pills. Probiotics. Medicines to make your poop soft (stool softeners). If you were prescribed antibiotics, take them as told by your doctor. Do not stop taking them even if you start to feel better. Ask your  doctor if you should avoid driving or using machines while you are taking your medicine. Eating and drinking  Follow the diet told by your doctor. You may need to only eat and drink liquids. When you feel better, you may be able to eat more foods. You may also be told to eat a lot of fiber. Fiber helps you poop. Foods with fiber include berries, beans, lentils, and green vegetables. Try not to eat red meat. General instructions Do not smoke or use any products that contain nicotine or tobacco. If you need help quitting, ask your doctor. Exercise 3 or more times a week. Try to go for 30 minutes each time. Exercise enough to sweat and make your heart beat faster. Contact a doctor if: Your pain gets worse. You are not pooping like normal. Your symptoms do not get better. Your symptoms get worse very fast. You have a fever. You vomit more than one time. You have poop that is: Bloody. Black. Tarry. This information is not intended to replace advice given to you by your health care provider. Make sure you discuss any questions you have with your health care provider. Document Revised: 10/06/2021 Document Reviewed: 10/06/2021 Elsevier Patient Education  2024 [employer].

## 2023-07-13 NOTE — Addendum Note (Signed)
 Addended by: Andreina Outten, MARY-MARGARET on: 07/13/2023 12:08 PM   Modules accepted: Orders

## 2023-09-18 ENCOUNTER — Encounter: Payer: Self-pay | Admitting: Nurse Practitioner

## 2023-09-18 ENCOUNTER — Ambulatory Visit (INDEPENDENT_AMBULATORY_CARE_PROVIDER_SITE_OTHER): Admitting: Nurse Practitioner

## 2023-09-18 VITALS — BP 131/76 | HR 75 | Temp 98.9°F | Ht 64.0 in | Wt 175.0 lb

## 2023-09-18 DIAGNOSIS — J0100 Acute maxillary sinusitis, unspecified: Secondary | ICD-10-CM

## 2023-09-18 DIAGNOSIS — M79645 Pain in left finger(s): Secondary | ICD-10-CM

## 2023-09-18 MED ORDER — AMOXICILLIN-POT CLAVULANATE 875-125 MG PO TABS: 1.0000 | ORAL_TABLET | Freq: Two times a day (BID) | ORAL | 0 refills | Status: AC

## 2023-09-18 MED ORDER — PREDNISONE 20 MG PO TABS: 40.0000 mg | ORAL_TABLET | Freq: Every day | ORAL | 0 refills | Status: AC

## 2023-09-18 NOTE — Progress Notes (Signed)
 Subjective:    Patient ID: Gabriela Wilson, female    DOB: 06-13-1945, 78 y.o.   MRN: [id number]   Chief Complaint: Sinusitis ( left thumb pain/swelling)   Sinusitis This is a new problem. The current episode started in the past 7 days. The problem has been waxing and waning since onset. There has been no fever. Her pain is at a severity of 3/10. The pain is mild. Associated symptoms include congestion, coughing, headaches and sinus pressure. Past treatments include acetaminophen and oral decongestants. The treatment provided mild relief.   Thumb pain Started about 1 month ago. Denies injury. Mainly in her proximal thumb joint. Pops when moving. Has tried ibupprofen and absorbine Jr. No relief. Rates pain 2/10. Movement increases pain to 6-8/10.  Patient Active Problem List   Diagnosis Date Noted   Diverticulosis 06/17/2020   Back pain without sciatica 06/17/2020   BMI 29.0-29.9,adult 07/31/2014   Osteopenia 06/10/2014   Gastroesophageal reflux disease 07/07/2013   Hyperlipidemia with target LDL less than 100 09/11/2012   GAD (generalized anxiety disorder) 05/08/2012   Essential hypertension, benign 05/08/2012   PANIC DISORDER 03/13/2008   Allergic rhinitis 03/13/2008   Emphysema/COPD (HCC) 03/13/2008        Review of Systems  HENT:  Positive for congestion and sinus pressure.   Respiratory:  Positive for cough.   Neurological:  Positive for headaches.       Objective:   Physical Exam Constitutional:      Appearance: Normal appearance.  HENT:     Right Ear: Tympanic membrane normal.     Left Ear: Tympanic membrane normal.     Nose: Congestion and rhinorrhea present.     Right Sinus: Maxillary sinus tenderness present.     Left Sinus: Maxillary sinus tenderness present.     Mouth/Throat:     Pharynx: No oropharyngeal exudate or posterior oropharyngeal erythema.  Cardiovascular:     Rate and Rhythm: Normal rate and regular rhythm.     Heart sounds: Normal heart  sounds.  Pulmonary:     Breath sounds: Normal breath sounds.  Skin:    General: Skin is warm.  Neurological:     General: No focal deficit present.     Mental Status: She is alert and oriented to person, place, and time.  Psychiatric:        Mood and Affect: Mood normal.        Behavior: Behavior normal.    BP 131/76   Pulse 75   Temp 98.9 F (37.2 C)   Ht 5' 4 (1.626 m)   Wt 175 lb (79.4 kg)   SpO2 96%   BMI 30.04 kg/m         Assessment & Plan:  Gabriela Wilson in today with chief complaint of Sinusitis ( left thumb pain/swelling)   1. Acute non-recurrent maxillary sinusitis (Primary) 1. Take meds as prescribed 2. Use a cool mist humidifier especially during the winter months and when heat has been humid. 3. Use saline nose sprays frequently 4. Saline irrigations of the nose can be very helpful if done frequently.  * 4X daily for 1 week*  * Use of a nettie pot can be helpful with this. Follow directions with this* 5. Drink plenty of fluids 6. Keep thermostat turn down low 7.For any cough or congestion- mucinex if needed 8. For fever or aces or pains- take tylenol or ibuprofen appropriate for age and weight.  * for fevers greater than 101 orally you  may alternate ibuprofen and tylenol every  3 hours.    - amoxicillin -clavulanate (AUGMENTIN ) 875-125 MG tablet; Take 1 tablet by mouth 2 (two) times daily.  Dispense: 14 tablet; Refill: 0  2. Pain of left thumb Warm epsom salt soaks bid Thumb spica splint - predniSONE  (DELTASONE ) 20 MG tablet; Take 2 tablets (40 mg total) by mouth daily with breakfast for 5 days. 2 po daily for 5 days  Dispense: 10 tablet; Refill: 0    The above assessment and management plan was discussed with the patient. The patient verbalized understanding of and has agreed to the management plan. Patient is aware to call the clinic if symptoms persist or worsen. Patient is aware when to return to the clinic for a follow-up visit. Patient  educated on when it is appropriate to go to the emergency department.   Gabriela Gladis, FNP

## 2023-09-18 NOTE — Patient Instructions (Signed)
 1. Take meds as prescribed 2. Use a cool mist humidifier especially during the winter months and when heat has been humid. 3. Use saline nose sprays frequently 4. Saline irrigations of the nose can be very helpful if done frequently.  * 4X daily for 1 week*  * Use of a nettie pot can be helpful with this. Follow directions with this* 5. Drink plenty of fluids 6. Keep thermostat turn down low 7.For any cough or congestion- mucinex if needed 8. For fever or aces or pains- take tylenol or ibuprofen appropriate for age and weight.  * for fevers greater than 101 orally you may alternate ibuprofen and tylenol every  3 hours.

## 2023-11-08 ENCOUNTER — Other Ambulatory Visit (HOSPITAL_COMMUNITY): Payer: Self-pay | Admitting: Nurse Practitioner

## 2023-11-08 DIAGNOSIS — Z1231 Encounter for screening mammogram for malignant neoplasm of breast: Secondary | ICD-10-CM

## 2023-12-10 ENCOUNTER — Ambulatory Visit (HOSPITAL_COMMUNITY)
Admission: RE | Admit: 2023-12-10 | Discharge: 2023-12-10 | Disposition: A | Discharge status: Home / Self Care | Source: Ambulatory Visit | Attending: Nurse Practitioner | Admitting: Nurse Practitioner

## 2023-12-10 DIAGNOSIS — Z1231 Encounter for screening mammogram for malignant neoplasm of breast: Secondary | ICD-10-CM | POA: Insufficient documentation

## 2024-01-03 ENCOUNTER — Other Ambulatory Visit: Payer: Self-pay | Admitting: Nurse Practitioner

## 2024-01-03 DIAGNOSIS — J439 Emphysema, unspecified: Secondary | ICD-10-CM

## 2024-01-11 ENCOUNTER — Ambulatory Visit: Payer: Self-pay | Admitting: Nurse Practitioner

## 2024-01-11 ENCOUNTER — Encounter: Payer: Self-pay | Admitting: Nurse Practitioner

## 2024-01-11 VITALS — BP 146/69 | HR 63 | Temp 97.9°F | Ht 64.0 in | Wt 177.0 lb

## 2024-01-11 DIAGNOSIS — K219 Gastro-esophageal reflux disease without esophagitis: Secondary | ICD-10-CM

## 2024-01-11 DIAGNOSIS — F410 Panic disorder [episodic paroxysmal anxiety] without agoraphobia: Secondary | ICD-10-CM

## 2024-01-11 DIAGNOSIS — Z6829 Body mass index (BMI) 29.0-29.9, adult: Secondary | ICD-10-CM

## 2024-01-11 DIAGNOSIS — E785 Hyperlipidemia, unspecified: Secondary | ICD-10-CM

## 2024-01-11 DIAGNOSIS — J439 Emphysema, unspecified: Secondary | ICD-10-CM

## 2024-01-11 DIAGNOSIS — Z23 Encounter for immunization: Secondary | ICD-10-CM

## 2024-01-11 DIAGNOSIS — M542 Cervicalgia: Secondary | ICD-10-CM | POA: Diagnosis not present

## 2024-01-11 DIAGNOSIS — F411 Generalized anxiety disorder: Secondary | ICD-10-CM | POA: Diagnosis not present

## 2024-01-11 DIAGNOSIS — M8588 Other specified disorders of bone density and structure, other site: Secondary | ICD-10-CM

## 2024-01-11 DIAGNOSIS — I10 Essential (primary) hypertension: Secondary | ICD-10-CM | POA: Diagnosis not present

## 2024-01-11 DIAGNOSIS — K5790 Diverticulosis of intestine, part unspecified, without perforation or abscess without bleeding: Secondary | ICD-10-CM

## 2024-01-11 LAB — LIPID PANEL

## 2024-01-11 MED ORDER — ALPRAZOLAM 0.5 MG PO TABS: 0.5000 mg | ORAL_TABLET | Freq: Every day | ORAL | 5 refills | Status: DC

## 2024-01-11 MED ORDER — MELOXICAM 15 MG PO TABS: 15.0000 mg | ORAL_TABLET | Freq: Every day | ORAL | 0 refills | Status: DC

## 2024-01-11 MED ORDER — ALPRAZOLAM 0.5 MG PO TABS: 0.5000 mg | ORAL_TABLET | Freq: Every day | ORAL | 5 refills | Status: AC

## 2024-01-11 MED ORDER — FLUTICASONE-SALMETEROL 100-50 MCG/ACT IN AEPB: INHALATION_SPRAY | RESPIRATORY_TRACT | 1 refills | Status: AC

## 2024-01-11 MED ORDER — AMLODIPINE BESYLATE 5 MG PO TABS: 5.0000 mg | ORAL_TABLET | Freq: Every day | ORAL | 1 refills | Status: AC

## 2024-01-11 MED ORDER — LISINOPRIL-HYDROCHLOROTHIAZIDE 20-12.5 MG PO TABS: 2.0000 | ORAL_TABLET | Freq: Every day | ORAL | 1 refills | Status: AC

## 2024-01-11 MED ORDER — OMEPRAZOLE 20 MG PO CPDR: 20.0000 mg | DELAYED_RELEASE_CAPSULE | Freq: Every day | ORAL | 1 refills | Status: AC

## 2024-01-11 NOTE — Patient Instructions (Signed)
 Fall Prevention in the Home, Adult Falls can cause injuries and can happen to people of all ages. There are many things you can do to make your home safer and to help prevent falls. What actions can I take to prevent falls? General information Use good lighting in all rooms. Make sure to: Replace any light bulbs that burn out. Turn on the lights in dark areas and use night-lights. Keep items that you use often in easy-to-reach places. Lower the shelves around your home if needed. Move furniture so that there are clear paths around it. Do not use throw rugs or other things on the floor that can make you trip. If any of your floors are uneven, fix them. Add color or contrast paint or tape to clearly mark and help you see: Grab bars or handrails. First and last steps of staircases. Where the edge of each step is. If you use a ladder or stepladder: Make sure that it is fully opened. Do not climb a closed ladder. Make sure the sides of the ladder are locked in place. Have someone hold the ladder while you use it. Know where your pets are as you move through your home. What can I do in the bathroom?     Keep the floor dry. Clean up any water on the floor right away. Remove soap buildup in the bathtub or shower. Buildup makes bathtubs and showers slippery. Use non-skid mats or decals on the floor of the bathtub or shower. Attach bath mats securely with double-sided, non-slip rug tape. If you need to sit down in the shower, use a non-slip stool. Install grab bars by the toilet and in the bathtub and shower. Do not use towel bars as grab bars. What can I do in the bedroom? Make sure that you have a light by your bed that is easy to reach. Do not use any sheets or blankets on your bed that hang to the floor. Have a firm chair or bench with side arms that you can use for support when you get dressed. What can I do in the kitchen? Clean up any spills right away. If you need to reach something  above you, use a step stool with a grab bar. Keep electrical cords out of the way. Do not use floor polish or wax that makes floors slippery. What can I do with my stairs? Do not leave anything on the stairs. Make sure that you have a light switch at the top and the bottom of the stairs. Make sure that there are handrails on both sides of the stairs. Fix handrails that are broken or loose. Install non-slip stair treads on all your stairs if they do not have carpet. Avoid having throw rugs at the top or bottom of the stairs. Choose a carpet that does not hide the edge of the steps on the stairs. Make sure that the carpet is firmly attached to the stairs. Fix carpet that is loose or worn. What can I do on the outside of my home? Use bright outdoor lighting. Fix the edges of walkways and driveways and fix any cracks. Clear paths of anything that can make you trip, such as tools or rocks. Add color or contrast paint or tape to clearly mark and help you see anything that might make you trip as you walk through a door, such as a raised step or threshold. Trim any bushes or trees on paths to your home. Check to see if handrails are loose  or broken and that both sides of all steps have handrails. Install guardrails along the edges of any raised decks and porches. Have leaves, snow, or ice cleared regularly. Use sand, salt, or ice melter on paths if you live where there is ice and snow during the winter. Clean up any spills in your garage right away. This includes grease or oil spills. What other actions can I take? Review your medicines with your doctor. Some medicines can cause dizziness or changes in blood pressure, which increase your risk of falling. Wear shoes that: Have a low heel. Do not wear high heels. Have rubber bottoms and are closed at the toe. Feel good on your feet and fit well. Use tools that help you move around if needed. These include: Canes. Walkers. Scooters. Crutches. Ask  your doctor what else you can do to help prevent falls. This may include seeing a physical therapist to learn to do exercises to move better and get stronger. Where to find more information Centers for Disease Control and Prevention, [employer]: TonerPromos.no [employer] on Aging: BaseRingTones.pl National Institute on Aging: BaseRingTones.pl Contact a doctor if: You are afraid of falling at home. You feel weak, drowsy, or dizzy at home. You fall at home. Get help right away if you: Lose consciousness or have trouble moving after a fall. Have a fall that causes a head injury. These symptoms may be an emergency. Get help right away. Call 911. Do not wait to see if the symptoms will go away. Do not drive yourself to the hospital. This information is not intended to replace advice given to you by your health care provider. Make sure you discuss any questions you have with your health care provider. Document Revised: 09/12/2021 Document Reviewed: 09/12/2021 Elsevier Patient Education  2024 [employer].

## 2024-01-11 NOTE — Addendum Note (Signed)
 Addended by: GLADIS MUSTARD on: 01/11/2024 11:18 AM   Modules accepted: Orders

## 2024-01-11 NOTE — Progress Notes (Addendum)
 "  Subjective:    Patient ID: Gabriela Wilson, female    DOB: August 24, 1945, 78 y.o.   MRN: [id number]   Chief Complaint: medical management of chronic issues     HPI:  Gabriela Wilson is a 78 y.o. who identifies as a female who was assigned female at birth.   Social history: Lives with: husband Work history: retired   Water Engineer in today for follow up of the following chronic medical issues:  1. Essential hypertension, benign No c/o chest pain, sob or headache. Does  check blood pressure at home and they are running in 120's systolic on average. BP Readings from Last 3 Encounters:  09/18/23 131/76  07/13/23 130/62  06/11/23 (!) 110/44     2. Hyperlipidemia with target LDL less than 100 Does try to watch tries to exercise in her pool daily. Refuses statin therapy Lab Results  Component Value Date   CHOL 192 01/12/2023   HDL 50 01/12/2023   LDLCALC 122 (H) 01/12/2023   TRIG 110 01/12/2023   CHOLHDL 3.8 01/12/2023   The 10-year ASCVD risk score (Arnett DK, et al., 2019) is: 31.8%   3. Diverticulosis Patient had flare up because she was use black red pepper to decrease blood pressure. Helped bloodpressure but flared up diverticulitis. Is better now.   4. Gastroesophageal reflux disease, unspecified whether esophagitis present Is on omeprazole  daily  5. Pulmonary emphysema, unspecified emphysema type (HCC) Denies any breathing problems. Has not seen pulmonology in awhile. Does use advair  daily  6. GAD (generalized anxiety disorder) 7. PANIC DISORDER Is on xanax  and takes 1x daily     01/11/2024   10:35 AM 09/18/2023    8:44 AM 07/13/2023    9:39 AM 07/13/2022    9:04 AM  GAD 7 : Generalized Anxiety Score  Nervous, Anxious, on Edge 0 0 0 0  Control/stop worrying 0 0 0 0  Worry too much - different things 0 0 0 0  Trouble relaxing 0 0 0 0  Restless 0 0 0 0  Easily annoyed or irritable 0 0 0 0  Afraid - awful might happen 0 0 0 0  Total GAD 7 Score 0 0 0 0  Anxiety  Difficulty Not difficult at all Not difficult at all Not difficult at all Not difficult at all       01/11/2024   10:35 AM 09/18/2023    8:44 AM 07/13/2023    9:39 AM  Depression screen PHQ 2/9  Decreased Interest 0 0 0  Down, Depressed, Hopeless 0 0 0  PHQ - 2 Score 0 0 0      8. Osteopenia of lumbar spine Is due to have dexascan repeated today  9. BMI 29.0-29.9,adult Weight is up 4 lbs Wt Readings from Last 3 Encounters:  01/11/24 177 lb (80.3 kg)  09/18/23 175 lb (79.4 kg)  07/13/23 176 lb (79.8 kg)   BMI Readings from Last 3 Encounters:  01/11/24 30.38 kg/m  09/18/23 30.04 kg/m  07/13/23 30.21 kg/m          New complaints: Neck pain- has been going on for several years. Worse some days. Just wants something to take as needed. Rated 7/10 currently  No Known Allergies Outpatient Encounter Medications as of 01/11/2024  Medication Sig   albuterol  (PROAIR  HFA) 108 (90 Base) MCG/ACT inhaler Inhale 2 puffs into the lungs every 6 (six) hours as needed.   ALPRAZolam  (XANAX ) 0.5 MG tablet Take 1 tablet (0.5 mg total) by mouth  daily.   amLODipine  (NORVASC ) 5 MG tablet Take 1 tablet (5 mg total) by mouth daily.   amoxicillin -clavulanate (AUGMENTIN ) 875-125 MG tablet Take 1 tablet by mouth 2 (two) times daily.   ciprofloxacin  (CIPRO ) 500 MG tablet Take 1 tablet (500 mg total) by mouth 2 (two) times daily.   fluticasone -salmeterol (ADVAIR ) 100-50 MCG/ACT AEPB INHALE 1 DOSE BY MOUTH TWICE DAILY   Ipratropium-Albuterol  (COMBIVENT) 20-100 MCG/ACT AERS respimat Inhale 1 puff into the lungs every 6 (six) hours.   lisinopril -hydrochlorothiazide  (ZESTORETIC ) 20-12.5 MG tablet Take 2 tablets by mouth daily.   Multiple Vitamin (MULTIVITAMIN WITH MINERALS) TABS tablet Take 1 tablet by mouth daily.   omeprazole  (PRILOSEC) 20 MG capsule Take 1 capsule (20 mg total) by mouth daily.   No facility-administered encounter medications on file as of 01/11/2024.    Past Surgical History:   Procedure Laterality Date   TUBAL LIGATION      Family History  Problem Relation Age of Onset   Asthma Mother    Hypertension Mother    Dementia Mother    Diabetes Father    COPD Father    Diabetes Sister    Hypertension Sister    Diabetes Brother    Early death Brother        MVA with quadrapelgic   Diabetes Brother    Dementia Brother    Diabetes Brother    Cancer Sister        vulva   Colon cancer Neg Hx       Controlled substance contract: n/a     Review of Systems  Constitutional:  Negative for diaphoresis.  Eyes:  Negative for pain.  Respiratory:  Positive for wheezing. Negative for shortness of breath.   Cardiovascular:  Negative for chest pain, palpitations and leg swelling.  Gastrointestinal:  Negative for abdominal pain.  Endocrine: Negative for polydipsia.  Skin:  Negative for rash.  Neurological:  Negative for dizziness, weakness and headaches.  Hematological:  Does not bruise/bleed easily.  All other systems reviewed and are negative.      Objective:   Physical Exam Vitals and nursing note reviewed.  Constitutional:      General: She is not in acute distress.    Appearance: Normal appearance. She is well-developed.  HENT:     Head: Normocephalic.     Right Ear: Tympanic membrane normal.     Left Ear: Tympanic membrane normal.     Nose: Nose normal.     Mouth/Throat:     Mouth: Mucous membranes are moist.  Eyes:     Pupils: Pupils are equal, round, and reactive to light.  Neck:     Vascular: No carotid bruit or JVD.  Cardiovascular:     Rate and Rhythm: Normal rate and regular rhythm.     Heart sounds: Normal heart sounds.  Pulmonary:     Effort: Pulmonary effort is normal. No respiratory distress.     Breath sounds: Normal breath sounds. No wheezing or rales.  Chest:     Chest wall: No tenderness.  Abdominal:     General: Bowel sounds are normal. There is no distension or abdominal bruit.     Palpations: Abdomen is soft. There is  no hepatomegaly, splenomegaly, mass or pulsatile mass.     Tenderness: There is no abdominal tenderness.  Musculoskeletal:        General: Normal range of motion.     Cervical back: Normal range of motion and neck supple.  Lymphadenopathy:  Cervical: No cervical adenopathy.  Skin:    General: Skin is warm and dry.  Neurological:     Mental Status: She is alert and oriented to person, place, and time.     Deep Tendon Reflexes: Reflexes are normal and symmetric.  Psychiatric:        Behavior: Behavior normal.        Thought Content: Thought content normal.        Judgment: Judgment normal.    BP (!) 146/69   Pulse 63   Temp 97.9 F (36.6 C) (Temporal)   Ht 5' 4 (1.626 m)   Wt 177 lb (80.3 kg)   SpO2 96%   BMI 30.38 kg/m           Assessment & Plan:  Gabriela Wilson comes in today with chief complaint of medical management of chronic issues    Diagnosis and orders addressed:  1. Essential hypertension, benign (Primary) Low sodium diet - amLODipine  (NORVASC ) 5 MG tablet; Take 1 tablet (5 mg total) by mouth daily.  Dispense: 90 tablet; Refill: 1 - lisinopril -hydrochlorothiazide  (ZESTORETIC ) 20-12.5 MG tablet; Take 2 tablets by mouth daily.  Dispense: 180 tablet; Refill: 1 - CBC with Differential/Platelet - CMP14+EGFR  2. Hyperlipidemia with target LDL less than 100 Low fat diet - Lipid panel  3. Pulmonary emphysema, unspecified emphysema type (HCC)  4. Gastroesophageal reflux disease, unspecified whether esophagitis present Avoid spicy foods Do not eat 2 hours prior to bedtime  - omeprazole  (PRILOSEC) 20 MG capsule; Take 1 capsule (20 mg total) by mouth daily.  Dispense: 90 capsule; Refill: 1  5. Diverticulosis Watch diet to prevent flare up  6. GAD (generalized anxiety disorder) Stress management - ToxASSURE Select 13 (MW), Urine - ALPRAZolam  (XANAX ) 0.5 MG tablet; Take 1 tablet (0.5 mg total) by mouth daily.  Dispense: 30 tablet; Refill: 5  7.  Osteopenia of lumbar spine Weight bearing exercise  8. BMI 29.0-29.9,adult Discussed diet and exercise for person with BMI >25 Will recheck weight in 3-6 months   9. EMPHYSEMA - Ipratropium-Albuterol  (COMBIVENT) 20-100 MCG/ACT AERS respimat; Inhale 1 puff into the lungs every 6 (six) hours.  Dispense: 12 g; Refill: 1  10. Neck pain Moist heat Rest Neck stretches Mobic 15mg  daily Do not take motrin while on mobic  Labs pending Health Maintenance reviewed Diet and exercise encouraged  Follow up plan: 6 months   Mary-Margaret Gladis, FNP  "

## 2024-01-12 LAB — CMP14+EGFR
ALT: 28 IU/L (ref 0–32)
AST: 25 IU/L (ref 0–40)
Albumin: 5 g/dL — AB (ref 3.8–4.8)
Alkaline Phosphatase: 51 IU/L (ref 49–135)
BUN/Creatinine Ratio: 14 (ref 12–28)
BUN: 15 mg/dL (ref 8–27)
Bilirubin Total: 0.7 mg/dL (ref 0.0–1.2)
CO2: 23 mmol/L (ref 20–29)
Calcium: 10.2 mg/dL (ref 8.7–10.3)
Chloride: 100 mmol/L (ref 96–106)
Creatinine, Ser: 1.11 mg/dL — AB (ref 0.57–1.00)
Globulin, Total: 1.8 g/dL (ref 1.5–4.5)
Glucose: 111 mg/dL — AB (ref 70–99)
Potassium: 4.6 mmol/L (ref 3.5–5.2)
Sodium: 140 mmol/L (ref 134–144)
Total Protein: 6.8 g/dL (ref 6.0–8.5)
eGFR: 51 mL/min/1.73 — AB

## 2024-01-12 LAB — CBC WITH DIFFERENTIAL/PLATELET
Basophils Absolute: 0.1 x10E3/uL (ref 0.0–0.2)
Basos: 1 %
EOS (ABSOLUTE): 0.3 x10E3/uL (ref 0.0–0.4)
Eos: 5 %
Hematocrit: 44.1 % (ref 34.0–46.6)
Hemoglobin: 14.9 g/dL (ref 11.1–15.9)
Immature Grans (Abs): 0 x10E3/uL (ref 0.0–0.1)
Immature Granulocytes: 0 %
Lymphocytes Absolute: 2.2 x10E3/uL (ref 0.7–3.1)
Lymphs: 36 %
MCH: 31 pg (ref 26.6–33.0)
MCHC: 33.8 g/dL (ref 31.5–35.7)
MCV: 92 fL (ref 79–97)
Monocytes Absolute: 0.4 x10E3/uL (ref 0.1–0.9)
Monocytes: 7 %
Neutrophils Absolute: 3.2 x10E3/uL (ref 1.4–7.0)
Neutrophils: 51 %
Platelets: 275 x10E3/uL (ref 150–450)
RBC: 4.8 x10E6/uL (ref 3.77–5.28)
RDW: 12.8 % (ref 11.7–15.4)
WBC: 6.2 x10E3/uL (ref 3.4–10.8)

## 2024-01-12 LAB — LIPID PANEL
Cholesterol, Total: 209 mg/dL — AB (ref 100–199)
HDL: 50 mg/dL
LDL CALC COMMENT:: 4.2 ratio (ref 0.0–4.4)
LDL Chol Calc (NIH): 132 mg/dL — AB (ref 0–99)
Triglycerides: 150 mg/dL — AB (ref 0–149)
VLDL Cholesterol Cal: 27 mg/dL (ref 5–40)

## 2024-01-14 ENCOUNTER — Ambulatory Visit: Payer: Self-pay | Admitting: Nurse Practitioner

## 2024-01-16 LAB — TOXASSURE SELECT 13 (MW), URINE

## 2024-01-21 ENCOUNTER — Telehealth: Payer: Self-pay | Admitting: Family Medicine

## 2024-01-21 MED ORDER — HYDROCODONE BIT-HOMATROP MBR 5-1.5 MG/5ML PO SOLN: 5.0000 mL | Freq: Three times a day (TID) | ORAL | 0 refills | Status: AC | PRN

## 2024-01-21 NOTE — Telephone Encounter (Signed)
 Copied from CRM #[id number]. Topic: Clinical - Medical Advice >> Jan 21, 2024  8:15 AM Berwyn MATSU wrote: Reason for CRM:  Patient called in requesting medication for flu as she has been sick since Thursday. Per patient she states she has the flu. Per patient she prefers not to come in and is requesting a call back with answer.   May you please advise.

## 2024-02-04 ENCOUNTER — Other Ambulatory Visit: Payer: Self-pay | Admitting: Nurse Practitioner

## 2024-02-04 DIAGNOSIS — M542 Cervicalgia: Secondary | ICD-10-CM

## 2024-07-11 ENCOUNTER — Ambulatory Visit: Admitting: Nurse Practitioner
